# Patient Record
Sex: Female | Born: 1970 | Race: Black or African American | Hispanic: No | Marital: Married | Smoking: Never smoker
Health system: Southern US, Community
[De-identification: ages and names within clinical notes are randomized; demographics above are authoritative.]

## PROBLEM LIST (undated history)

## (undated) DIAGNOSIS — M069 Rheumatoid arthritis, unspecified: Secondary | ICD-10-CM

## (undated) DIAGNOSIS — Z1371 Encounter for nonprocreative screening for genetic disease carrier status: Secondary | ICD-10-CM

## (undated) DIAGNOSIS — Z9189 Other specified personal risk factors, not elsewhere classified: Secondary | ICD-10-CM

## (undated) DIAGNOSIS — E559 Vitamin D deficiency, unspecified: Secondary | ICD-10-CM

## (undated) DIAGNOSIS — T7840XA Allergy, unspecified, initial encounter: Secondary | ICD-10-CM

## (undated) DIAGNOSIS — A048 Other specified bacterial intestinal infections: Secondary | ICD-10-CM

## (undated) DIAGNOSIS — I499 Cardiac arrhythmia, unspecified: Secondary | ICD-10-CM

## (undated) DIAGNOSIS — Z803 Family history of malignant neoplasm of breast: Secondary | ICD-10-CM

## (undated) DIAGNOSIS — J302 Other seasonal allergic rhinitis: Secondary | ICD-10-CM

## (undated) DIAGNOSIS — E669 Obesity, unspecified: Secondary | ICD-10-CM

## (undated) DIAGNOSIS — Z8249 Family history of ischemic heart disease and other diseases of the circulatory system: Secondary | ICD-10-CM

## (undated) DIAGNOSIS — K219 Gastro-esophageal reflux disease without esophagitis: Secondary | ICD-10-CM

## (undated) DIAGNOSIS — D509 Iron deficiency anemia, unspecified: Secondary | ICD-10-CM

## (undated) HISTORY — DX: Rheumatoid arthritis, unspecified: M06.9

## (undated) HISTORY — DX: Vitamin D deficiency, unspecified: E55.9

## (undated) HISTORY — DX: Gastro-esophageal reflux disease without esophagitis: K21.9

## (undated) HISTORY — DX: Family history of ischemic heart disease and other diseases of the circulatory system: Z82.49

## (undated) HISTORY — PX: BREAST BIOPSY: SHX20

## (undated) HISTORY — DX: Obesity, unspecified: E66.9

## (undated) HISTORY — DX: Other seasonal allergic rhinitis: J30.2

## (undated) HISTORY — DX: Other specified bacterial intestinal infections: A04.8

## (undated) HISTORY — DX: Allergy, unspecified, initial encounter: T78.40XA

## (undated) HISTORY — DX: Iron deficiency anemia, unspecified: D50.9

## (undated) HISTORY — DX: Other specified personal risk factors, not elsewhere classified: Z91.89

## (undated) HISTORY — DX: Family history of malignant neoplasm of breast: Z80.3

## (undated) HISTORY — DX: Encounter for nonprocreative screening for genetic disease carrier status: Z13.71

## (undated) HISTORY — PX: WISDOM TOOTH EXTRACTION: SHX21

---

## 1998-06-12 ENCOUNTER — Emergency Department (HOSPITAL_COMMUNITY): Admission: EM | Admit: 1998-06-12 | Discharge: 1998-06-12 | Payer: Self-pay | Admitting: Emergency Medicine

## 2001-09-21 ENCOUNTER — Emergency Department (HOSPITAL_COMMUNITY): Admission: EM | Admit: 2001-09-21 | Discharge: 2001-09-21 | Payer: Self-pay

## 2001-09-30 ENCOUNTER — Emergency Department (HOSPITAL_COMMUNITY): Admission: EM | Admit: 2001-09-30 | Discharge: 2001-09-30 | Payer: Self-pay | Admitting: Emergency Medicine

## 2001-11-15 ENCOUNTER — Emergency Department (HOSPITAL_COMMUNITY): Admission: EM | Admit: 2001-11-15 | Discharge: 2001-11-15 | Payer: Self-pay | Admitting: Emergency Medicine

## 2002-11-11 ENCOUNTER — Emergency Department (HOSPITAL_COMMUNITY): Admission: EM | Admit: 2002-11-11 | Discharge: 2002-11-12 | Payer: Self-pay | Admitting: Emergency Medicine

## 2002-12-28 ENCOUNTER — Encounter: Payer: Self-pay | Admitting: Emergency Medicine

## 2002-12-28 ENCOUNTER — Emergency Department (HOSPITAL_COMMUNITY): Admission: EM | Admit: 2002-12-28 | Discharge: 2002-12-28 | Payer: Self-pay | Admitting: Emergency Medicine

## 2004-05-10 ENCOUNTER — Emergency Department (HOSPITAL_COMMUNITY): Admission: EM | Admit: 2004-05-10 | Discharge: 2004-05-10 | Payer: Self-pay | Admitting: Emergency Medicine

## 2005-12-21 ENCOUNTER — Emergency Department (HOSPITAL_COMMUNITY): Admission: EM | Admit: 2005-12-21 | Discharge: 2005-12-21 | Payer: Self-pay | Admitting: Emergency Medicine

## 2006-08-30 ENCOUNTER — Emergency Department (HOSPITAL_COMMUNITY): Admission: EM | Admit: 2006-08-30 | Discharge: 2006-08-31 | Payer: Self-pay | Admitting: Emergency Medicine

## 2008-12-06 ENCOUNTER — Ambulatory Visit: Payer: Self-pay | Admitting: Family Medicine

## 2008-12-06 ENCOUNTER — Other Ambulatory Visit: Admission: RE | Admit: 2008-12-06 | Discharge: 2008-12-06 | Payer: Self-pay | Admitting: Family Medicine

## 2009-05-23 ENCOUNTER — Ambulatory Visit: Payer: Self-pay | Admitting: Family Medicine

## 2009-07-11 ENCOUNTER — Ambulatory Visit: Payer: Self-pay | Admitting: Family Medicine

## 2009-07-24 ENCOUNTER — Ambulatory Visit: Payer: Self-pay | Admitting: Family Medicine

## 2009-08-22 ENCOUNTER — Ambulatory Visit: Payer: Self-pay | Admitting: Family Medicine

## 2009-08-22 ENCOUNTER — Encounter: Admission: RE | Admit: 2009-08-22 | Discharge: 2009-08-22 | Payer: Self-pay | Admitting: Family Medicine

## 2009-09-12 ENCOUNTER — Ambulatory Visit: Payer: Self-pay | Admitting: Physician Assistant

## 2009-09-13 ENCOUNTER — Ambulatory Visit: Payer: Self-pay | Admitting: Physician Assistant

## 2009-10-10 ENCOUNTER — Emergency Department (HOSPITAL_COMMUNITY): Admission: EM | Admit: 2009-10-10 | Discharge: 2009-10-10 | Payer: Self-pay | Admitting: Emergency Medicine

## 2009-10-24 ENCOUNTER — Ambulatory Visit: Payer: Self-pay | Admitting: Family Medicine

## 2010-10-07 LAB — URINALYSIS, ROUTINE W REFLEX MICROSCOPIC
Bilirubin Urine: NEGATIVE
Ketones, ur: NEGATIVE mg/dL
Nitrite: NEGATIVE
pH: 6 (ref 5.0–8.0)

## 2010-10-07 LAB — BASIC METABOLIC PANEL
BUN: 8 mg/dL (ref 6–23)
Creatinine, Ser: 0.88 mg/dL (ref 0.4–1.2)
GFR calc Af Amer: >60 mL/min (ref >60)
GFR calc non Af Amer: >60 mL/min (ref >60)
Glucose, Bld: 97 mg/dL (ref 70–99)
Sodium: 130 mEq/L — ABNORMAL LOW (ref 135–145)

## 2010-10-07 LAB — CBC
HCT: 37.1 % (ref 36.0–46.0)
MCV: 80.7 fL (ref 78.0–100.0)
Platelets: 281 10*3/uL (ref 150–400)
RDW: 16.9 % — ABNORMAL HIGH (ref 11.5–15.5)

## 2010-10-07 LAB — URINE CULTURE

## 2010-10-07 LAB — URINE MICROSCOPIC-ADD ON

## 2010-10-07 LAB — DIFFERENTIAL
Basophils Absolute: 0 10*3/uL (ref 0.0–0.1)
Eosinophils Absolute: 0 10*3/uL (ref 0.0–0.7)
Eosinophils Relative: 1 % (ref 0–5)
Neutrophils Relative %: 84 % — ABNORMAL HIGH (ref 43–77)

## 2010-10-14 DIAGNOSIS — A048 Other specified bacterial intestinal infections: Secondary | ICD-10-CM

## 2010-10-14 HISTORY — DX: Other specified bacterial intestinal infections: A04.8

## 2011-01-01 ENCOUNTER — Encounter: Payer: Self-pay | Admitting: Family Medicine

## 2011-01-01 ENCOUNTER — Ambulatory Visit (INDEPENDENT_AMBULATORY_CARE_PROVIDER_SITE_OTHER): Payer: No Typology Code available for payment source | Admitting: Family Medicine

## 2011-01-01 VITALS — BP 122/78 | HR 68 | Ht 65.0 in | Wt 215.0 lb

## 2011-01-01 DIAGNOSIS — J069 Acute upper respiratory infection, unspecified: Secondary | ICD-10-CM

## 2011-01-01 DIAGNOSIS — M542 Cervicalgia: Secondary | ICD-10-CM

## 2011-01-01 MED ORDER — NAPROXEN 500 MG PO TABS: 500.0000 mg | ORAL_TABLET | Freq: Two times a day (BID) | ORAL | Status: DC

## 2011-01-01 NOTE — Patient Instructions (Signed)
I recommend that you restart the sinus medication to help with the postnasal drip.  I also recommend getting Mucinex to help loosen up the phlegm.  If you get mucinex-D, then do NOT also take a sinus medication along with it.  I recommend you get  Mucinex DM (which has a cough suppressant), and take that along with the sinus medication and anti-inflammatory.

## 2011-01-01 NOTE — Progress Notes (Signed)
  Subjective:    Patient ID: Kristy [name], female    DOB: 1971/06/12, 40 y.o.   MRN: [id number]  HPI Began with a sore throat 2 weeks ago, then developed hoarseness and moved into the chest with some coughing.  Pain started in upper back and shoulders around the time that cold moved into chest.  Still has persistent hoarseness, chest congestion, and cough at night, worse when lying down.  Pain in back is also worse at night.  Pain is between her shoulder blades, and up into her neck, more on the left side than the right.  Has taken sinus medication (which helped), and a half of a muscle relaxant on day 3 of illness, but no medications since then.  Still needs to blow her nose 1 or 2x/day, slightly discolored.  Not coughing up any phlegm, feels like it is stuck in her chest   Review of Systems Denies fevers, nausea, vomiting, diarrhea, skin rash, ear pain, sore throat, other joint pains or other concerns    Objective:   Physical Exam BP 122/78  Pulse 68  Ht 5\' 5"  (1.651 m)  Wt 215 lb (97.523 kg)  BMI 35.78 kg/m2  LMP 12/19/2010 Well developed, pleasant female, in no distress.  Some throat clearing, slightly raspy voice HEENT:PERRL, EOMI, conjunctiva clear.  Nasal mucosa mildly edematous, clear mucus.  Sinuses nontender.  OP with some cobblestoning posteriorly.  TM's and EAC's normal on right, very mild effusion on left, otherwise normal Neck: no lymphadenopathy or thyromegaly.  No spinal tenderness.  Tender bilateral paraspinous muscles L>R with some spasm, also involving L trapezius muscle and rhomboids--minimal spasm noted Heart:  Regular rate and rhythm, no murmurs Lungs:  Clear bilaterally Skin: no rashes        Assessment & Plan:   1. URI (upper respiratory infection)    2. Neck pain  naproxen (NAPROSYN) 500 MG tablet   muscular in origin.  Discussed heat, massage and NSAIDS    NSAID precautions reviewed.  Heat, massage, stretches shown. Mucinex, decongestants prn  F/u  prn

## 2011-05-27 ENCOUNTER — Ambulatory Visit: Payer: Self-pay | Admitting: Obstetrics and Gynecology

## 2011-05-28 ENCOUNTER — Ambulatory Visit: Payer: Self-pay | Admitting: Obstetrics and Gynecology

## 2011-07-02 ENCOUNTER — Encounter: Payer: Self-pay | Admitting: Medical

## 2011-07-02 ENCOUNTER — Ambulatory Visit (INDEPENDENT_AMBULATORY_CARE_PROVIDER_SITE_OTHER): Payer: No Typology Code available for payment source | Admitting: Medical

## 2011-07-02 VITALS — BP 100/70 | HR 72 | Temp 98.4°F | Resp 12 | Wt 230.0 lb

## 2011-07-02 DIAGNOSIS — R519 Headache, unspecified: Secondary | ICD-10-CM | POA: Insufficient documentation

## 2011-07-02 DIAGNOSIS — J069 Acute upper respiratory infection, unspecified: Secondary | ICD-10-CM | POA: Insufficient documentation

## 2011-07-02 DIAGNOSIS — R51 Headache: Secondary | ICD-10-CM | POA: Insufficient documentation

## 2011-07-02 MED ORDER — HYDROCODONE-HOMATROPINE 5-1.5 MG/5ML PO SYRP: 5.0000 mL | ORAL_SOLUTION | Freq: Four times a day (QID) | ORAL | Status: AC | PRN

## 2011-07-02 NOTE — Patient Instructions (Signed)
Influenza, Adult Influenza ("the flu") is a viral infection of the respiratory tract. It causes chills, fever, cough, headache, body aches, and sore throat. Influenza in general will make you feel sicker than when you have a common cold. Symptoms of the illness typically last a few days. Cough and fatigue may continue for as long as 7 to 10 days. Influenza is highly contagious. It spreads easily to others in the droplets from coughs and sneezes. People frequently become infected by touching something that was recently contaminated with the virus and then touch their mouth, nose or eyes. This infection is caused by a virus. Symptoms will not be reduced or improved by taking an antibiotic. Antibiotics are medications that kill bacteria, not viruses. DIAGNOSIS  Diagnosis of influenza is often made based on the history and physical examination as well as the presence of influenza reports occurring in your community. Testing can be done if the diagnosis is not certain. TREATMENT  Since influenza is caused by a virus, antibiotics are not helpful. Your caregiver may prescribe antiviral medicines to shorten the illness and lessen the severity. Your caregiver may also recommend influenza vaccination and/or antiviral medicines for your family members in order to prevent the spread of influenza to them. HOME CARE INSTRUCTIONS  DO NOT GIVE ASPIRIN TO PERSONS WITH INFLUENZA WHO ARE UNDER 18 YEARS OF AGE. This could lead to brain and liver damage (Reye's syndrome). Read the label on over-the-counter medicines.   Stay home from work or school if at all possible until most of your symptoms are gone.   Only take over-the-counter or prescription medicines for pain, discomfort, or fever as directed by your caregiver.   Use a cool mist humidifier to increase air moisture. This will make breathing easier.   Rest until your temperature is nearly normal: 98.6 F (37 C). This usually takes 3 to 4 days. Be sure you get  plenty of rest.   Drink at least eight, eight-ounce glasses of fluids per day. Fluids include water, juice, broth, gelatin, or lemonade.   Cover your mouth and nose when coughing or sneezing and wash your hands often to prevent the spread of this virus to other persons.  PREVENTION  Annual influenza vaccination (flu shots) is the best way to avoid getting influenza. An annual flu shot is now routinely recommended for all adults in the [place] SEEK MEDICAL CARE IF:   You develop shortness of breath while resting.   You have a deep cough with production of mucous or chest pain.   You develop nausea (feeling sick to your stomach), vomiting, or diarrhea.  SEEK IMMEDIATE MEDICAL CARE IF:   You have difficulty breathing, become short of breath, or your skin or nails turn bluish.   You develop severe neck pain or stiffness.   You develop a severe headache, facial pain, or earache.   You have a fever.   You develop nausea or vomiting that cannot be controlled.  Document Released: 06/28/2000 Document Revised: 03/13/2011 Document Reviewed: 05/03/2009 [employer] Patient Information 2012 [employer]. 

## 2011-07-02 NOTE — Progress Notes (Signed)
Subjective:   HPI  Kristy [name] is a 40 y.o. female who presents with illness. C/o fever 103.4 last night, coughing, sore throat, congestion, runny nose, sneezing, bad headache, body aches, and chills x 2 days.  She has had some sick contacts.  Using Tylenol Cold and Flu, Tylenol sinus. No other aggravating or relieving factors.  No other c/o.  The following portions of the patient's history were reviewed and updated as appropriate: allergies, current medications, past family history, past medical history, past social history, past surgical history and problem list.  Past Medical History  Diagnosis Date  . H. pylori infection 10/2010  . Iron deficiency anemia, unspecified   . Unspecified vitamin D deficiency     Review of Systems Constitutional: +fever, +chills, -sweats, -unexpected -weight change,-fatigue ENT: +runny nose, -ear pain, +sore throat Cardiology:  -chest pain, -palpitations, -edema Respiratory: +cough, +shortness of breath, +wheezing Gastroenterology: -abdominal pain, -nausea, -vomiting, -diarrhea, -constipation Hematology: -bleeding or bruising problems Musculoskeletal: -arthralgias, -myalgias, -joint swelling, -back pain Ophthalmology: -vision changes Urology: -dysuria, -difficulty urinating, -hematuria, -urinary frequency, -urgency Neurology: +headache, -weakness, -tingling, -numbness    Objective:   Physical Exam  Filed Vitals:   07/02/11 1205  BP: 100/70  Pulse: 72  Temp: 98.4 F (36.9 C)  Resp: 12    General appearance: alert, no distress, WD/WN, mildly ill appearing HEENT: normocephalic, sclerae anicteric, conjunctiva pink and moist, TMs pearly, nares with swollen turbinates, clear discharge mild erythema, pharynx normal, tonsils unremarkable Oral cavity: MMM, no lesions Neck: supple, no lymphadenopathy, no thyromegaly, no masses Heart: RRR, normal S1, S2, no murmurs Lungs: CTA bilaterally, no wheezes, rhonchi, or rales Pulses: 2+  symmetric   Assessment and Plan :    Encounter Diagnoses  Name Primary?  . URI (upper respiratory infection) Yes  . Headache    Discussed supportive care, prevention, Ibuprofen for headaches, Hycodan syrup for cough prn.  Follow-up if worse or not improving.  Discussed usual course of illness.

## 2011-08-12 ENCOUNTER — Ambulatory Visit: Payer: Self-pay | Admitting: Obstetrics and Gynecology

## 2012-03-04 ENCOUNTER — Ambulatory Visit (INDEPENDENT_AMBULATORY_CARE_PROVIDER_SITE_OTHER): Payer: No Typology Code available for payment source | Admitting: Medical

## 2012-03-04 ENCOUNTER — Encounter: Payer: Self-pay | Admitting: Medical

## 2012-03-04 VITALS — BP 120/80 | HR 88 | Temp 98.1°F | Resp 16 | Wt 227.0 lb

## 2012-03-04 DIAGNOSIS — J329 Chronic sinusitis, unspecified: Secondary | ICD-10-CM

## 2012-03-04 DIAGNOSIS — R0600 Dyspnea, unspecified: Secondary | ICD-10-CM

## 2012-03-04 DIAGNOSIS — R0609 Other forms of dyspnea: Secondary | ICD-10-CM

## 2012-03-04 DIAGNOSIS — R0602 Shortness of breath: Secondary | ICD-10-CM

## 2012-03-04 DIAGNOSIS — R079 Chest pain, unspecified: Secondary | ICD-10-CM

## 2012-03-04 MED ORDER — AMOXICILLIN 500 MG PO CAPS: ORAL_CAPSULE | ORAL | Status: DC

## 2012-03-04 NOTE — Progress Notes (Signed)
Subjective: Here for c/o not feeling well.  She reports 3 wk hx/o sinus pressure, pain behind the eyes, chest congestion, chest pain, nausea, irritated throat which is some better, stuffy nose, congestion, SOB at times.  Concerned about sinus infection.  She also reports at times feels chest pains, sometimes SOB, worse with stairs or being physically active, more noticeable than in the past.  She doesn't exercise in general. She does note chest pain with lifting certain things or moving fast.  Denies associated nausea, sweats, numbness, tingling or weakness.  Past Medical History  Diagnosis Date  . H. pylori infection 10/2010  . Iron deficiency anemia, unspecified   . Unspecified vitamin D deficiency    Family hx/o: Father with onset of heart disease in late 9s, but he passed away of MI age 49yo Mom hx/o heart disease diagnosed early 40s  ROS as noted in HPI    Objective:   Physical Exam  Filed Vitals:   03/04/12 1426  BP: 120/80  Pulse: 88  Temp: 98.1 F (36.7 C)  Resp: 16    General appearance: alert, no distress, WD/WN, AA female HEENT: normocephalic, sclerae anicteric, +sinus tenderness,TMs pearly, nares with turbinate swelling and erythema, pharynx normal Oral cavity: MMM, no lesions Neck: supple, no lymphadenopathy, no thyromegaly, no masses Heart: RRR, normal S1, S2, no murmurs Lungs: CTA bilaterally, no wheezes, rhonchi, or rales Abdomen: +bs, soft, non tender, non distended, no masses, no hepatomegaly, no splenomegaly Pulses: 2+ symmetric Ext: no edema   Adult ECG Report  Indication: chest pain  Rate: 82bpm  Rhythm: normal sinus rhythm  QRS Axis: 65 degrees  PR Interval:  QRS Duration: 92ms  QTc:  Conduction Disturbances: none  Other Abnormalities: nonspecific T wave abnormality  Patient's cardiac risk factors are: family history of premature cardiovascular disease, obesity (BMI >= 30 kg/m2) and sedentary lifestyle.  EKG comparison: none  Narrative Interpretation: no acute changes, nonspecific T wave abnormality   CXR: normal cardiac silhouette, no pneumonia, no mass, no obvious deformity.  Of note, brass wires and fastner visible.  No acute findings.     Assessment and Plan :    Encounter Diagnoses  Name Primary?  . Sinusitis Yes  . Chest pain   . SOB (shortness of breath)   . DOE (dyspnea on exertion)     Sinusitis - begin Amoxicillin, rest, hydrate well, use OTC Mucinex, nasal saline.    Chest pain, SOB, DOE - discussed symptoms and concerns.  EKG and CXR not that remarkable.   I suspect her chest pain and symptoms are more related to musculoskeletal chest pain and SOB aggravated by sinusitis and chest congestion.  However, advised she return soon for physical, fasting labs, and if continued symptoms or worse, may consider cardiac eval.

## 2012-05-01 ENCOUNTER — Telehealth: Payer: Self-pay | Admitting: Medical

## 2012-05-01 NOTE — Telephone Encounter (Signed)
Pt notified of shane recommendations

## 2012-05-01 NOTE — Telephone Encounter (Signed)
i would recommend adding zyrtec allergy (not Zyrtec D), or mucinex plain OTC

## 2012-05-21 ENCOUNTER — Ambulatory Visit (INDEPENDENT_AMBULATORY_CARE_PROVIDER_SITE_OTHER): Payer: No Typology Code available for payment source | Admitting: Medical

## 2012-05-21 ENCOUNTER — Encounter: Payer: Self-pay | Admitting: Medical

## 2012-05-21 VITALS — BP 112/80 | HR 78 | Temp 98.2°F | Resp 16 | Wt 225.0 lb

## 2012-05-21 DIAGNOSIS — J310 Chronic rhinitis: Secondary | ICD-10-CM

## 2012-05-21 DIAGNOSIS — J329 Chronic sinusitis, unspecified: Secondary | ICD-10-CM

## 2012-05-21 MED ORDER — LEVOFLOXACIN 500 MG PO TABS: 500.0000 mg | ORAL_TABLET | Freq: Every day | ORAL | Status: DC

## 2012-05-21 MED ORDER — FLUTICASONE PROPIONATE 50 MCG/ACT NA SUSP: 2.0000 | Freq: Every day | NASAL | Status: DC

## 2012-05-21 NOTE — Patient Instructions (Signed)
Begin Levaquin antibiotic once daily for 1 week.  Begin fluticasone nasal spray, 2 sprays per nostril daily for the nasal congestion.  Drink plenty of water daily.   Try doing some nasal saline flush or neti pot.  Rest  If you are not completely resolved over this in 10-14 days, then call back.

## 2012-05-21 NOTE — Progress Notes (Signed)
Subjective: Here for recheck.  I saw her in August for sinus infection.  She notes that she never got better from treatment in August.  Currently has nasal congestion, yellow mucous, headache, has a mucous odor in her nose.   Denies fever, NVD, sore throat, ear pain.  Feels itchy in her ears, not much sneezing though.  No itchy eyes.   Feels very tired.  Has only had a few sinus infections in her life.  Has some chest congestion.  Using Tylenol Cold occasionally.  No other aggravating or relieving factors.  No sick contacts. Doesn't normally have allergy problems.  Gets sinus headaches every now and then.  Past Medical History  Diagnosis Date  . H. pylori infection 10/2010  . Iron deficiency anemia, unspecified   . Unspecified vitamin D deficiency    ROS as in HPI    Objective:   Physical Exam  Filed Vitals:   05/21/12 1004  BP: 112/80  Pulse: 78  Temp: 98.2 F (36.8 C)  Resp: 16    General appearance: alert, no distress, WD/WN  HEENT: normocephalic, sclerae anicteric, TMs pearly, nares with swollen turbinates, no discharge or erythema, pharynx with cobblestoning Oral cavity: MMM, no lesions Neck: supple, no lymphadenopathy, no thyromegaly, no masses Heart: RRR, normal S1, S2, no murmurs Lungs: CTA bilaterally, no wheezes, rhonchi, or rales  Assessment and Plan :    Encounter Diagnoses  Name Primary?  . Sinusitis Yes  . Rhinitis    Patient Instructions  Begin Levaquin antibiotic once daily for 1 week.  Begin fluticasone nasal spray, 2 sprays per nostril daily for the nasal congestion.  Drink plenty of water daily.   Try doing some nasal saline flush or neti pot.  Rest  If you are not completely resolved over this in 10-14 days, then call back.

## 2012-07-03 ENCOUNTER — Telehealth: Payer: Self-pay | Admitting: Medical

## 2012-07-03 NOTE — Telephone Encounter (Signed)
Pt was called and message was relayed

## 2012-07-03 NOTE — Telephone Encounter (Signed)
This doesn't necessarily sound urgent, but she does need OV to discuss.   I don't have any records related to gyn or the issues mentioned.

## 2012-07-03 NOTE — Telephone Encounter (Signed)
Pt called and stated that she was recently taken off birth control pills due to problems with clotting. Pt states this morning she has a nose bleed that ended with a huge clot coming out of her nose. She states after that her nose stopped bleeding. She also mentioned that her bp yesterday was 145/95 and this morning 135/80. We do not have any appointments left for today. Please call pt and inform if this is something to be concerned about. Pt uses [facility] [address]

## 2012-07-20 ENCOUNTER — Encounter (HOSPITAL_COMMUNITY): Payer: Self-pay | Admitting: Emergency Medicine

## 2012-07-20 ENCOUNTER — Emergency Department (HOSPITAL_COMMUNITY)
Admission: EM | Admit: 2012-07-20 | Discharge: 2012-07-20 | Disposition: A | Discharge status: Home / Self Care | Payer: No Typology Code available for payment source | Attending: Emergency Medicine | Admitting: Emergency Medicine

## 2012-07-20 DIAGNOSIS — Z8719 Personal history of other diseases of the digestive system: Secondary | ICD-10-CM | POA: Insufficient documentation

## 2012-07-20 DIAGNOSIS — R197 Diarrhea, unspecified: Secondary | ICD-10-CM | POA: Insufficient documentation

## 2012-07-20 DIAGNOSIS — R109 Unspecified abdominal pain: Secondary | ICD-10-CM | POA: Insufficient documentation

## 2012-07-20 DIAGNOSIS — Z3202 Encounter for pregnancy test, result negative: Secondary | ICD-10-CM | POA: Insufficient documentation

## 2012-07-20 DIAGNOSIS — R112 Nausea with vomiting, unspecified: Secondary | ICD-10-CM

## 2012-07-20 DIAGNOSIS — E559 Vitamin D deficiency, unspecified: Secondary | ICD-10-CM | POA: Insufficient documentation

## 2012-07-20 DIAGNOSIS — Z862 Personal history of diseases of the blood and blood-forming organs and certain disorders involving the immune mechanism: Secondary | ICD-10-CM | POA: Insufficient documentation

## 2012-07-20 DIAGNOSIS — Z79899 Other long term (current) drug therapy: Secondary | ICD-10-CM | POA: Insufficient documentation

## 2012-07-20 LAB — COMPREHENSIVE METABOLIC PANEL
ALT: 9 U/L (ref 0–35)
AST: 19 U/L (ref 0–37)
Alkaline Phosphatase: 69 U/L (ref 39–117)
CO2: 19 mEq/L (ref 19–32)
Calcium: 10.2 mg/dL (ref 8.4–10.5)
Chloride: 99 mEq/L (ref 96–112)
GFR calc non Af Amer: 78 mL/min — ABNORMAL LOW (ref >90)
Potassium: 4.5 mEq/L (ref 3.5–5.1)
Sodium: 133 mEq/L — ABNORMAL LOW (ref 135–145)
Total Bilirubin: 0.3 mg/dL (ref 0.3–1.2)

## 2012-07-20 LAB — CBC WITH DIFFERENTIAL/PLATELET
Basophils Absolute: 0 10*3/uL (ref 0.0–0.1)
Lymphocytes Relative: 9 % — ABNORMAL LOW (ref 12–46)
Neutro Abs: 7 10*3/uL (ref 1.7–7.7)
Neutrophils Relative %: 85 % — ABNORMAL HIGH (ref 43–77)
Platelets: 343 10*3/uL (ref 150–400)
RBC: 5.7 MIL/uL — ABNORMAL HIGH (ref 3.87–5.11)
RDW: 14.2 % (ref 11.5–15.5)
WBC: 8.2 10*3/uL (ref 4.0–10.5)

## 2012-07-20 LAB — URINALYSIS, MICROSCOPIC ONLY
Glucose, UA: NEGATIVE mg/dL
Protein, ur: >300 mg/dL — AB

## 2012-07-20 MED ORDER — SODIUM CHLORIDE 0.9 % IV BOLUS (SEPSIS)
1000.0000 mL | Freq: Once | INTRAVENOUS | Status: AC
  Administered 2012-07-20: 1000 mL via INTRAVENOUS

## 2012-07-20 MED ORDER — ONDANSETRON HCL 8 MG PO TABS: 8.0000 mg | ORAL_TABLET | Freq: Three times a day (TID) | ORAL | Status: DC | PRN

## 2012-07-20 MED ORDER — CETIRIZINE-PSEUDOEPHEDRINE ER 5-120 MG PO TB12: 1.0000 | ORAL_TABLET | Freq: Two times a day (BID) | ORAL | Status: DC | PRN

## 2012-07-20 MED ORDER — SODIUM CHLORIDE 0.9 % IV BOLUS (SEPSIS): 1000.0000 mL | Freq: Once | INTRAVENOUS | Status: DC

## 2012-07-20 MED ORDER — ONDANSETRON HCL 4 MG/2ML IJ SOLN: 4.0000 mg | Freq: Once | INTRAMUSCULAR | Status: DC

## 2012-07-20 MED ORDER — SODIUM CHLORIDE 0.9 % IV SOLN
1000.0000 mL | Freq: Once | INTRAVENOUS | Status: AC
  Administered 2012-07-20: 1000 mL via INTRAVENOUS

## 2012-07-20 MED ORDER — ONDANSETRON HCL 4 MG/2ML IJ SOLN
4.0000 mg | Freq: Once | INTRAMUSCULAR | Status: AC
  Administered 2012-07-20: 4 mg via INTRAVENOUS
  Filled 2012-07-20: qty 2

## 2012-07-20 NOTE — ED Provider Notes (Addendum)
History     CSN: [id number]  Arrival date & time 07/20/12  0827   First MD Initiated Contact with Patient 07/20/12 [phone]      Chief Complaint  Patient presents with  . Nausea  . Emesis  . Diarrhea    (Consider location/radiation/quality/duration/timing/severity/associated sxs/prior treatment) Patient is a 42 y.o. female presenting with vomiting and diarrhea. The history is provided by the patient.  Emesis  Associated symptoms include diarrhea. Pertinent negatives include no abdominal pain, no chills, no cough, no fever and no headaches.  Diarrhea The primary symptoms include vomiting and diarrhea. Primary symptoms do not include fever, abdominal pain, dysuria or rash.  The illness does not include chills or back pain.  pt with nvd today since early this am. Several episodes of each. Emesis clear, not bloody or bilious. Diarrhea watery, not bloody. Intermittent mid abd crampy pain, no constant or focal abd pain. No dysuria or gu c/o. No vaginal discharge or bleeding. lnmp 2 weeks ago. No known ill contacts, bad food ingestion, recent travel, or abx use. No fever or chills.     Past Medical History  Diagnosis Date  . H. pylori infection 10/2010  . Iron deficiency anemia, unspecified   . Unspecified vitamin D deficiency     Past Surgical History  Procedure Date  . Cesarean section     History reviewed. No pertinent family history.  History  Substance Use Topics  . Smoking status: Never Smoker   . Smokeless tobacco: Not on file  . Alcohol Use: Yes     Comment: maybe one drink a year    OB History    Grav Para Term Preterm Abortions TAB SAB Ect Mult Living                  Review of Systems  Constitutional: Negative for fever and chills.  HENT: Negative for sore throat and neck pain.   Eyes: Negative for redness.  Respiratory: Negative for cough and shortness of breath.   Cardiovascular: Negative for chest pain.  Gastrointestinal: Positive for vomiting and  diarrhea. Negative for abdominal pain.  Genitourinary: Negative for dysuria and flank pain.  Musculoskeletal: Negative for back pain.  Skin: Negative for rash.  Neurological: Negative for headaches.  Hematological: Does not bruise/bleed easily.  Psychiatric/Behavioral: Negative for confusion.    Allergies  Contrast media  Home Medications   Current Outpatient Rx  Name  Route  Sig  Dispense  Refill  . ADVIL ALLERGY & CONGESTION PO   Oral   Take 1 tablet by mouth daily as needed. For sinus headache.         Marland Kitchen VITAMIN D 1000 UNITS PO TABS   Oral   Take 1,000 Units by mouth daily.         Marland Kitchen GARCINIA CAMBOGIA-CHROMIUM PO   Oral   Take 2 tablets by mouth daily.         . ADULT MULTIVITAMIN W/MINERALS CH   Oral   Take 1 tablet by mouth daily.         Marland Kitchen VITAMIN C 500 MG PO TABS   Oral   Take 500 mg by mouth daily.           BP 111/79  Pulse 132  Temp 98.5 F (36.9 C) (Oral)  Resp 18  SpO2 98%  LMP 07/03/2012  Physical Exam  Nursing note and vitals reviewed. Constitutional: She appears well-developed and well-nourished. No distress.  HENT:  Mouth/Throat: Oropharynx is clear and  moist.  Eyes: Conjunctivae normal are normal. No scleral icterus.  Neck: Neck supple. No tracheal deviation present.  Cardiovascular: Normal rate, regular rhythm, normal heart sounds and intact distal pulses.   Pulmonary/Chest: Effort normal and breath sounds normal. No respiratory distress.  Abdominal: Soft. Normal appearance and bowel sounds are normal. She exhibits no distension and no mass. There is no tenderness. There is no rebound and no guarding.  Genitourinary:       No cva tenderness  Musculoskeletal: She exhibits no edema.  Neurological: She is alert.  Skin: Skin is warm and dry. No rash noted.  Psychiatric: She has a normal mood and affect.    ED Course  Procedures (including critical care time)   Labs Reviewed  CBC WITH DIFFERENTIAL  COMPREHENSIVE METABOLIC  PANEL  LIPASE, BLOOD  URINALYSIS, MICROSCOPIC ONLY   Results for orders placed during the hospital encounter of 07/20/12  CBC WITH DIFFERENTIAL      Component Value Range   WBC 8.2  4.0 - 10.5 K/uL   RBC 5.70 (*) 3.87 - 5.11 MIL/uL   Hemoglobin 15.5 (*) 12.0 - 15.0 g/dL   HCT 21.3  08.6 - 57.8 %   MCV 79.5  78.0 - 100.0 fL   MCH 27.2  26.0 - 34.0 pg   MCHC 34.2  30.0 - 36.0 g/dL   RDW 46.9  62.9 - 52.8 %   Platelets 343  150 - 400 K/uL   Neutrophils Relative 85 (*) 43 - 77 %   Neutro Abs 7.0  1.7 - 7.7 K/uL   Lymphocytes Relative 9 (*) 12 - 46 %   Lymphs Abs 0.8  0.7 - 4.0 K/uL   Monocytes Relative 5  3 - 12 %   Monocytes Absolute 0.4  0.1 - 1.0 K/uL   Eosinophils Relative 1  0 - 5 %   Eosinophils Absolute 0.1  0.0 - 0.7 K/uL   Basophils Relative 0  0 - 1 %   Basophils Absolute 0.0  0.0 - 0.1 K/uL  COMPREHENSIVE METABOLIC PANEL      Component Value Range   Sodium 133 (*) 135 - 145 mEq/L   Potassium 4.5  3.5 - 5.1 mEq/L   Chloride 99  96 - 112 mEq/L   CO2 19  19 - 32 mEq/L   Glucose, Bld 101 (*) 70 - 99 mg/dL   BUN 12  6 - 23 mg/dL   Creatinine, Ser 4.13  0.50 - 1.10 mg/dL   Calcium 24.4  8.4 - 01.0 mg/dL   Total Protein 9.4 (*) 6.0 - 8.3 g/dL   Albumin 4.2  3.5 - 5.2 g/dL   AST 19  0 - 37 U/L   ALT 9  0 - 35 U/L   Alkaline Phosphatase 69  39 - 117 U/L   Total Bilirubin 0.3  0.3 - 1.2 mg/dL   GFR calc non Af Amer 78 (*) >90 mL/min   GFR calc Af Amer >90  >90 mL/min  LIPASE, BLOOD      Component Value Range   Lipase 22  11 - 59 U/L  URINALYSIS, MICROSCOPIC ONLY      Component Value Range   Color, Urine AMBER (*) YELLOW   APPearance CLOUDY (*) CLEAR   Specific Gravity, Urine 1.031 (*) 1.005 - 1.030   pH 5.5  5.0 - 8.0   Glucose, UA NEGATIVE  NEGATIVE mg/dL   Hgb urine dipstick MODERATE (*) NEGATIVE   Bilirubin Urine NEGATIVE  NEGATIVE  Ketones, ur 15 (*) NEGATIVE mg/dL   Protein, ur >409 (*) NEGATIVE mg/dL   Urobilinogen, UA 0.2  0.0 - 1.0 mg/dL   Nitrite  NEGATIVE  NEGATIVE   Leukocytes, UA NEGATIVE  NEGATIVE   RBC / HPF 3-6  <3 RBC/hpf  POCT PREGNANCY, URINE      Component Value Range   Preg Test, Ur NEGATIVE  NEGATIVE        MDM  Iv ns bolus. zofran iv.  Reviewed nursing notes and prior charts for additional history.    2nd liter ns iv.  Recheck abd soft nt. Tolerating po. No recurrent nv.   Pt appears stable for d/c.     At d/c pt requests rx for nasal congestion/rhinorrhea. rx zyrtec d given.   Suzi Roots, MD 07/20/12 1032  Suzi Roots, MD 07/20/12 [phone]

## 2012-07-20 NOTE — ED Notes (Signed)
Pt requesting Rx for head and chest congestion prior to DC home, unable to locate Dr. Denton Lank, no answer on phone, will monitor.

## 2012-07-20 NOTE — ED Notes (Signed)
MD at bedside. 

## 2012-07-20 NOTE — ED Notes (Signed)
Pt states that she has been having NVD since 0400.  States she started a fast with her church on Thursday and has only been eating vegetables, fruits, and nuts.

## 2012-07-20 NOTE — ED Notes (Signed)
While talking to pt, HR jumped up and consistently stayed around 130.

## 2012-07-31 ENCOUNTER — Encounter: Payer: Self-pay | Admitting: Family Medicine

## 2012-07-31 ENCOUNTER — Ambulatory Visit (INDEPENDENT_AMBULATORY_CARE_PROVIDER_SITE_OTHER): Payer: No Typology Code available for payment source | Admitting: Family Medicine

## 2012-07-31 VITALS — BP 120/80 | HR 107 | Temp 98.7°F | Wt 217.0 lb

## 2012-07-31 DIAGNOSIS — J329 Chronic sinusitis, unspecified: Secondary | ICD-10-CM

## 2012-07-31 DIAGNOSIS — J029 Acute pharyngitis, unspecified: Secondary | ICD-10-CM

## 2012-07-31 NOTE — Progress Notes (Signed)
  Subjective:    Patient ID: Kristy [name], female    DOB: 1971-05-31, 42 y.o.   MRN: [id number]  HPI She is here for recheck. She recently had difficulty with nasal congestion starting in August. She was seen in November and treated with Levaquin as well as a nasal spray. That apparently did not help. She still having difficulty with nasal congestion, postnasal drainage, headache. No fever or chills. She has been placed on Amoxil, Augmentin and most recently Levaquin as well as given Flonase. The sinus congestion and postnasal drainage have continued.  Review of Systems     Objective:   Physical Exam alert and in no distress. Tympanic membranes and canals are normal. Throat shows diffuse swelling of the uvula. Tonsils are normal. Neck is supple without adenopathy or thyromegaly. Cardiac exam shows a regular sinus rhythm without murmurs or gallops. Lungs are clear to auscultation. Nasal mucosa is normal with some tenderness over frontal and maxillary sinuses       Assessment & Plan:   1. Sore throat  POCT rapid strep A  2. Chronic sinusitis     limited CT sinuses will be ordered.

## 2012-08-03 ENCOUNTER — Other Ambulatory Visit: Payer: Self-pay

## 2012-08-03 ENCOUNTER — Ambulatory Visit
Admission: RE | Admit: 2012-08-03 | Discharge: 2012-08-03 | Disposition: A | Discharge status: Home / Self Care | Payer: No Typology Code available for payment source | Source: Ambulatory Visit | Attending: Family Medicine | Admitting: Family Medicine

## 2012-08-03 DIAGNOSIS — J329 Chronic sinusitis, unspecified: Secondary | ICD-10-CM

## 2012-08-04 NOTE — Progress Notes (Signed)
Quick Note:  Pt informed of Dr.lalonde wants to send her to allergist she said ok [facility] [phone] jan 27th at 8:30 ______

## 2012-11-09 ENCOUNTER — Encounter: Payer: Self-pay | Admitting: Medical

## 2012-11-09 ENCOUNTER — Ambulatory Visit (INDEPENDENT_AMBULATORY_CARE_PROVIDER_SITE_OTHER): Payer: BC Managed Care – PPO | Admitting: Medical

## 2012-11-09 VITALS — BP 118/80 | HR 92 | Temp 97.9°F | Resp 16 | Ht 65.0 in | Wt 229.0 lb

## 2012-11-09 DIAGNOSIS — Z0000 Encounter for general adult medical examination without abnormal findings: Secondary | ICD-10-CM

## 2012-11-09 DIAGNOSIS — R002 Palpitations: Secondary | ICD-10-CM

## 2012-11-09 DIAGNOSIS — R202 Paresthesia of skin: Secondary | ICD-10-CM

## 2012-11-09 DIAGNOSIS — R209 Unspecified disturbances of skin sensation: Secondary | ICD-10-CM

## 2012-11-09 DIAGNOSIS — E669 Obesity, unspecified: Secondary | ICD-10-CM

## 2012-11-09 DIAGNOSIS — Z803 Family history of malignant neoplasm of breast: Secondary | ICD-10-CM

## 2012-11-09 DIAGNOSIS — R5383 Other fatigue: Secondary | ICD-10-CM

## 2012-11-09 DIAGNOSIS — R5381 Other malaise: Secondary | ICD-10-CM

## 2012-11-09 DIAGNOSIS — E559 Vitamin D deficiency, unspecified: Secondary | ICD-10-CM

## 2012-11-09 LAB — CBC WITH DIFFERENTIAL/PLATELET
Basophils Absolute: 0 10*3/uL (ref 0.0–0.1)
Basophils Relative: 0 % (ref 0–1)
HCT: 34.2 % — ABNORMAL LOW (ref 36.0–46.0)
Hemoglobin: 11.4 g/dL — ABNORMAL LOW (ref 12.0–15.0)
Lymphs Abs: 2.1 10*3/uL (ref 0.7–4.0)
MCH: 26.5 pg (ref 26.0–34.0)
MCHC: 33.3 g/dL (ref 30.0–36.0)
Monocytes Absolute: 0.3 10*3/uL (ref 0.1–1.0)
Neutro Abs: 2.3 10*3/uL (ref 1.7–7.7)
Neutrophils Relative %: 48 % (ref 43–77)
RDW: 15.5 % (ref 11.5–15.5)
WBC: 4.9 10*3/uL (ref 4.0–10.5)

## 2012-11-09 LAB — POCT URINALYSIS DIPSTICK
Ketones, UA: NEGATIVE
Protein, UA: NEGATIVE
Spec Grav, UA: 1.01
Urobilinogen, UA: NEGATIVE
pH, UA: 5

## 2012-11-09 NOTE — Progress Notes (Signed)
Subjective:   HPI  Kristy [name] is a 42 y.o. female who presents for a complete physical.  Last physical over 1 year ago.     Preventative care: Last ophthalmology visit:n/a Last dental visit:yes-unsure of the name Last colonoscopy:n/a Last mammogram:08/2011, has been getting since age 18yo, normal last year, been getting yearly, no prior  Gene testing Last gynecological exam:08/2011 , last pap 09/2011 normal Last EKG:02/2012 Last labs:09/2011  Prior vaccinations: TD or Tdap:about 5 years ago Influenza:n/a Pneumococcal: n/a Shingles/Zostavax:n/a  Advanced directive:n/a Health care power of attorney:n/a Living will:n/a  Concerns: Hands and feet feel numb at times.  Wonders if this is related to her work.   Worse when first wake up.  Once she gets up and gets moving, doesn't have problems.  Sometimes have pain in hands.   Has been diagnosed with plantar fascitis in feet.    Not currently sexually active.   No OCPs currently.  Uses condoms, uses gels.   Last time she was on contraception, had blood clot issue in nose, so OCP was discontinued.    Saw me for chest pain few months ago.   Still gets palpitations 2-3 times daily regardless of caffeine use.    Reviewed their medical, surgical, family, social, medication, and allergy history and updated chart as appropriate.   Past Medical History  Diagnosis Date  . H. pylori infection 10/2010  . Iron deficiency anemia, unspecified   . Unspecified vitamin D deficiency   . Eczema     Past Surgical History  Procedure Laterality Date  . Cesarean section    . Wisdom tooth extraction      Family History  Problem Relation Age of Onset  . Heart disease Mother 56    stent, CAD  . Cancer Mother 51    breast, with recurrence  . Cancer Father     esophageal cancer  . Heart disease Father 9    died of MI  . Alcohol abuse Father   . Hypertension Sister   . Hypertension Brother   . Cancer Maternal Grandmother     breast  .  Diabetes Neg Hx   . Stroke Neg Hx   . Hypertension Brother     History   Social History  . Marital Status: Married    Spouse Name: N/A    Number of Children: N/A  . Years of Education: N/A   Occupational History  . Not on file.   Social History Main Topics  . Smoking status: Never Smoker   . Smokeless tobacco: Not on file  . Alcohol Use: No     Comment: maybe one drink a year  . Drug Use: No  . Sexually Active: Not on file   Other Topics Concern  . Not on file   Social History Narrative   Separated, in process of divorce, some walking, works as a Social worker    No current outpatient prescriptions on file prior to visit.   No current facility-administered medications on file prior to visit.    Allergies  Allergen Reactions  . Contrast Media (Iodinated Diagnostic Agents) Itching      Review of Systems Constitutional: -fever, -chills, -sweats, -unexpected weight change, -decreased appetite, +fatigue Allergy: -sneezing, -itching, -congestion Dermatology: -changing moles, --rash, -lumps ENT: -runny nose, -ear pain, -sore throat, -hoarseness, +sinus pain, -teeth pain, - ringing in ears, -hearing loss, -nosebleeds Cardiology: -chest pain, -palpitations, -swelling, -difficulty breathing when lying flat, -waking up short of breath Respiratory: -cough, -shortness of breath, -difficulty  breathing with exercise or exertion, -wheezing, -coughing up blood Gastroenterology: -abdominal pain, -nausea, -vomiting, -diarrhea, -constipation, -blood in stool, -changes in bowel movement, -difficulty swallowing or eating Hematology: -bleeding, -bruising  Musculoskeletal: +joint aches, -muscle aches, -joint swelling, -back pain, -neck pain, -cramping, -changes in gait Ophthalmology: denies vision changes, eye redness, itching, discharge Urology: -burning with urination, -difficulty urinating, -blood in urine, -urinary frequency, -urgency, -incontinence Neurology: -headache, -weakness,  +tingling, +numbness, -memory loss, -falls, -dizziness Psychology: -depressed mood, -agitation, -sleep problems     Objective:   Physical Exam  Vitals reviewed  General appearance: alert, no distress, WD/WN, AA female Skin: few small skin tags left neck HEENT: normocephalic, conjunctiva/corneas normal, sclerae anicteric, PERRLA, EOMi, nares patent, no discharge or erythema, pharynx normal Oral cavity: MMM, tongue normal, teeth in good repair Neck: supple, no lymphadenopathy, no thyromegaly, no masses, normal ROM, no bruits Chest: non tender, normal shape and expansion Heart: RRR, normal S1, S2, no murmurs Lungs: CTA bilaterally, no wheezes, rhonchi, or rales Abdomen: +bs, soft, non tender, non distended, no masses, no hepatomegaly, no splenomegaly, no bruits Back: non tender, normal ROM, no scoliosis Musculoskeletal: upper extremities non tender, no obvious deformity, normal ROM throughout, lower extremities non tender, no obvious deformity, normal ROM throughout Extremities: no edema, no cyanosis, no clubbing Pulses: 2+ symmetric, upper and lower extremities, normal cap refill Neurological: alert, oriented x 3, -phalens and tinels, CN2-12 intact, strength normal upper extremities and lower extremities, sensation normal throughout, DTRs 2+ throughout, no cerebellar signs, gait normal Psychiatric: normal affect, behavior normal, pleasant  Breast/gyn/rectal:  deferred    Assessment and Plan :    Encounter Diagnoses  Name Primary?  . Routine general medical examination at a health care facility Yes  . Obesity, unspecified   . Unspecified vitamin D deficiency   . Other malaise and fatigue   . Palpitations   . Family history of breast cancer   . Paresthesia     Physical exam - discussed healthy lifestyle, diet, exercise, preventative care, vaccinations, and addressed their concerns.  Handout given.  Obesity - discussed need to work on diet and exercise changes.    Vit D  deficiecy  - recheck labs, and advised she restart OTC Vit D 1000 IU daily  fatigue - labs today, need to increase exercise, work on weight loss, healthy diet.  Palpitations - refer to cardiology regarding family hx/o heart disease, palpitations, avoidance of caffeine  Family hx/o breast cancer - consider genetic testing, advised she consult her insurer, f/u with gynecology next week as planned.  Paresthesia - consider QHS OTC wrist splints, regular stretching.    Follow-up pending labs

## 2012-11-09 NOTE — Patient Instructions (Signed)
Check on the breast cancer gene test.   Ask your gynecologist again about frequency of mammograms.  Do you need them every year, vs every other year?  Get back on Vitamin D OTC daily  1000 IU daily.  We will call with lab results.   Start exercising with walking, preferably daily.  Eat a healthy low fat diet.  We will refer you to cardiology for palpitations and family history of heart disease.  If they clear you for weight loss medication (Phentermine/Qsymia), then we can try this.

## 2012-11-10 ENCOUNTER — Telehealth: Payer: Self-pay | Admitting: Family Medicine

## 2012-11-10 LAB — COMPREHENSIVE METABOLIC PANEL
ALT: 8 U/L (ref 0–35)
AST: 13 U/L (ref 0–37)
Albumin: 3.5 g/dL (ref 3.5–5.2)
Alkaline Phosphatase: 44 U/L (ref 39–117)
BUN: 6 mg/dL (ref 6–23)
CO2: 27 mEq/L (ref 19–32)
Calcium: 8.9 mg/dL (ref 8.4–10.5)
Creat: 0.84 mg/dL (ref 0.50–1.10)
Glucose, Bld: 81 mg/dL (ref 70–99)
Sodium: 138 mEq/L (ref 135–145)
Total Bilirubin: 0.3 mg/dL (ref 0.3–1.2)
Total Protein: 6.7 g/dL (ref 6.0–8.3)

## 2012-11-10 LAB — VITAMIN D 25 HYDROXY (VIT D DEFICIENCY, FRACTURES): Vit D, 25-Hydroxy: 12 ng/mL — ABNORMAL LOW (ref 30–89)

## 2012-11-10 LAB — LIPID PANEL
Cholesterol: 187 mg/dL (ref 0–200)
LDL Cholesterol: 130 mg/dL — ABNORMAL HIGH (ref 0–99)
Triglycerides: 59 mg/dL (ref <150)

## 2012-11-10 NOTE — Telephone Encounter (Signed)
Patient is aware of her appointment to see Dr. Donnie Aho on Nov 16, 2012 @ 145 pm. CLS   [phone] I fax over OV notes, labs and insurance card through [facility]. CLS

## 2012-11-12 DIAGNOSIS — Z1371 Encounter for nonprocreative screening for genetic disease carrier status: Secondary | ICD-10-CM

## 2012-11-12 DIAGNOSIS — Z9189 Other specified personal risk factors, not elsewhere classified: Secondary | ICD-10-CM

## 2012-11-12 HISTORY — DX: Encounter for nonprocreative screening for genetic disease carrier status: Z13.71

## 2012-11-12 HISTORY — DX: Other specified personal risk factors, not elsewhere classified: Z91.89

## 2012-11-16 ENCOUNTER — Encounter: Payer: Self-pay | Admitting: Cardiology

## 2012-11-16 NOTE — Progress Notes (Signed)
Patient ID: Artemio Aly, female   DOB: 09-06-1970, 42 y.o.   MRN: [id number] Laraine, Samet    Date of visit:  11/16/2012 DOB:  12-May-1971    Age:  42 yrs. Medical record number:  [id number]     Account number:  [id number] Primary Care Provider: Ernst Breach ____________________________ CURRENT DIAGNOSES  1. Palpitations  2. Obesity(BMI30-40) ____________________________ ALLERGIES  IV Contrast, Itching ____________________________ MEDICATIONS  1. Vitamin D3 2,000 unit tablet, 1 p.o. daily  2. Vitamin C 500 mg tablet extended release, 1 p.o. daily  3. iron 18 mg tablet, 1 p.o. daily ____________________________ CHIEF COMPLAINTS  Palp ____________________________ HISTORY OF PRESENT ILLNESS  Patient seen at the request of Kristian Covey for evaluation of palpitations. The patient recently presented for a physical and was asking about whether she can take anorexic suppressants. She has had episodic palpitations described as a skip or fluttering that will occur at various times. She has had been on off for the past 2-3 months and notes that they occur usually associated after drinking caffeine or even soft drinks that did not contain caffeine. She does not have any chest pain suggestive of angina. She has no PND, orthopnea, syncope, or claudication does not get much in the way of regular exercise. She does not use over-the-counter decongestants. ____________________________ PAST HISTORY  Past Medical Illnesses:  history of H. Pylori infection, obesity, denies hypertension or diabetes;  Cardiovascular Illnesses:  no previous history of cardiac disease.;  Surgical Procedures:  cesarean section;  Cardiology Procedures-Invasive:  no history of prior cardiac procedures;  Cardiology Procedures-Noninvasive:  event monitor May 2014;  LVEF not documented,   ____________________________ CARDIO-PULMONARY TEST DATES EKG Date:  11/16/2012;  Holter/Event Monitor Date: 11/16/2012;    ____________________________ FAMILY HISTORY Father - age 31,  died of myocardial infarction and cancer esophagus; Mother - age 49,  alive and well,  history of CAD,  history of PTCA/stent and breast cancer; Brother 1 - age 25,  alive and well and  hypertension; Brother 2 - age 1,  alive and well and  hypertension; Sister 1 - age 51,  alive and well and  hypertension; Sister 2 - age 60,  alive and well and  hypertension;  ____________________________ SOCIAL HISTORY Alcohol Use:  no alcohol use;  Smoking:  never smoked;  Diet:  regular diet;  Lifestyle:  separated and 1 daughter;  Exercise:  no regular exercise;  Occupation:  Social worker;  Residence:  lives with daughter;   ____________________________ REVIEW OF SYSTEMS General:  obesity  Integumentary:dry skin Eyes: denies diplopia, history of glaucoma or visual problems. Ears, Nose, Throat, Mouth:  denies any hearing loss, epistaxis, hoarseness or difficulty speaking. Respiratory: denies dyspnea, cough, wheezing or hemoptysis. Cardiovascular:  please review HPI Abdominal: constipation, diarrhea and dyspepsiaGenitourinary-Female: no dysuria, urgency, frequency, UTIs, or stress incontinence Musculoskeletal:  denies arthritis, venous insufficiency, or muscle weakness. Neurological:  denies headaches, stroke, or TIA Psychiatric:  denies depession or anxiety.  ____________________________ PHYSICAL EXAMINATION VITAL SIGNS  Blood Pressure:  118/80 Sitting, Left arm, large cuff  , 110/70 Standing, Left arm and large cuff   Pulse:  78/min. Weight:  231.00 lbs. Height:  65"BMI: 38  Constitutional:  pleasant African American female in no acute distress, severely obese Skin:  warm and dry to touch, no apparent skin lesions, or masses noted. Head:  normocephalic, normal hair pattern, no masses or tenderness Eyes:  EOMS Intact, PERRLA, C and S clear, Funduscopic exam not done. ENT:  ears, nose and throat reveal  no gross abnormalities.  Dentition  good. Neck:  supple, without massess. No JVD, thyromegaly or carotid bruits. Carotid upstroke normal. Chest:  normal symmetry, clear to auscultation and percussion. Cardiac:  regular rhythm, normal S1 and S2, No S3 or S4, no murmurs, gallops or rubs detected. Abdomen:  abdomen soft,non-tender, no masses, no hepatospenomegaly, or aneurysm noted Peripheral Pulses:  the femoral,dorsalis pedis, and posterior tibial pulses are full and equal bilaterally with no bruits auscultated. Extremities & Back:  no deformities, clubbing, cyanosis, erythema or edema observed. Normal muscle strength and tone. Neurological:  no gross motor or sensory deficits noted, affect appropriate, oriented x3. ____________________________ IMPRESSIONS/PLAN  1. Palpitations with a normal EKG 2. Severe obesity with need to lose weight  Recommendations:  Place an event monitor to determine the etiology of the palpitations. She asked about whether she can take anorexic agents and I told her that she would need to await the results of the event monitor. Followup in one month. ____________________________ TODAYS ORDERS  1. [place]: Today  2. Return Visit: 1 month  3. 12 Lead EKG: Today                       ____________________________ Cardiology Physician:  Darden Palmer MD [facility]

## 2012-12-21 ENCOUNTER — Encounter: Payer: Self-pay | Admitting: Cardiology

## 2012-12-21 NOTE — Progress Notes (Unsigned)
Patient ID: Kristy [name], female   DOB: [birth year], 42 y.o.   MRN: [id number]  Keerat, Denicola    Date of visit:  12/21/2012 DOB:  03-10-1971    Age:  42 yrs. Medical record number:  [id number]     Account number:  [id number] Primary Care Provider: Ernst Breach ____________________________ CURRENT DIAGNOSES  1. Arrhythmia-PVC's(symptomatic)  2. Dyspnea  3. Obesity(BMI30-40) ____________________________ ALLERGIES  IV Contrast, Itching ____________________________ MEDICATIONS  1. Vitamin D3 2,000 unit tablet, 1 p.o. daily  2. Vitamin C 500 mg tablet extended release, 1 p.o. daily  3. iron 18 mg tablet, 1 p.o. daily ____________________________ HISTORY OF PRESENT ILLNESS  Patient seen for cardiac followup. She has worn a cardiac event monitor for the past month the patient on occasional PVCs and bigeminy. She feels as if the palpitations have markedly improved. She does not any syncope or dizziness and denies angina or shortness of breath. ____________________________ PAST HISTORY  Past Medical Illnesses:  history of H. Pylori infection, obesity, denies hypertension or diabetes;  Cardiovascular Illnesses:  no previous history of cardiac disease.;  Surgical Procedures:  cesarean section;  Cardiology Procedures-Invasive:  no history of prior cardiac procedures;  Cardiology Procedures-Noninvasive:  event monitor May 2014;  LVEF not documented,   ____________________________ CARDIO-PULMONARY TEST DATES EKG Date:  11/16/2012;  Holter/Event Monitor Date: 11/16/2012;   ____________________________ SOCIAL HISTORY Alcohol Use:  no alcohol use;  Smoking:  never smoked;  Diet:  regular diet;  Lifestyle:  separated and 1 daughter;  Exercise:  no regular exercise;  Occupation:  Social worker;  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 Weight:  231.00 lbs. Height:  65"BMI: 38  Constitutional:  pleasant African American female in no acute distress, severely obese Skin:  warm and dry to touch, no apparent skin lesions, or masses noted. Chest:  normal symmetry, clear to auscultation and percussion. Cardiac:  regular rhythm, normal S1 and S2, No S3 or S4, no murmurs, gallops or rubs detected. Peripheral Pulses:  the femoral,dorsalis pedis, and posterior tibial pulses are full and equal bilaterally with no bruits auscultated. Neurological:  no gross motor or sensory deficits noted, affect appropriate, oriented x3. ____________________________ MOST RECENT LIPID PANEL 11/09/12  CHOL TOTL 187 mg/dl, LDL 952 calc, HDL 45 mg/dl, TRIGLYCER 59 mg/dl and CHOL/HDL 4.2 (Calc) ____________________________ IMPRESSIONS/PLAN  1. Palpitations are likely due to PVCs 2. Severe obesity 3. Mild dyspnea with exertion  Recommendations:  She does have mild dyspnea with exertion and suggested that she have an echocardiogram since she has PVCs and there is a possibility that anorectic agents are being contemplated. Otherwise we gave patient education about PVCs which I think in her case are likely benign and need no further treatment. She was instructed to avoid agents that would act as cardiac stimulants such as over-the-counter decongestants or caffeine. I will see as needed. ____________________________ TODAYS ORDERS  1. Return: prn  2. 2D, color flow, doppler: First Available                       ____________________________ Cardiology Physician:  Darden Palmer. MD [facility]

## 2013-01-14 ENCOUNTER — Telehealth: Payer: Self-pay | Admitting: Medical

## 2013-01-14 NOTE — Telephone Encounter (Signed)
Pt called and stated that you referred her to cardio to make sure she was ok before placing her on diet medication. Pt states that she just heard and they gave her the all clear. She is requesting that medication you talked about with her be called into her pharmacy, [facility] [address] Pt. Was informed that you are out of the office until Monday.

## 2013-01-18 NOTE — Telephone Encounter (Signed)
I called over to Dr. York Spaniel office and he is out of the office until Thursday but she will call me back and let me know something. CLS

## 2013-01-18 NOTE — Telephone Encounter (Signed)
pls verify with Dr. York Spaniel office that he is ok for her to start Phentermine/appetite suppressant.   I got notes from first visit and cardiac testing, but didn't see a f/u note or whether he was ok for her to start this.

## 2013-01-21 ENCOUNTER — Encounter: Payer: Self-pay | Admitting: Cardiology

## 2013-01-21 ENCOUNTER — Other Ambulatory Visit: Payer: Self-pay | Admitting: Medical

## 2013-01-21 ENCOUNTER — Other Ambulatory Visit: Payer: Self-pay

## 2013-01-21 MED ORDER — PHENTERMINE HCL 37.5 MG PO CAPS: 37.5000 mg | ORAL_CAPSULE | ORAL | Status: DC

## 2013-01-21 NOTE — Progress Notes (Signed)
WHAT IS THE DOSAGE

## 2013-01-21 NOTE — Progress Notes (Unsigned)
Patient ID: Kristy [name], female   DOB: 1971/05/13, 42 y.o.   MRN: [id number]  Patient has a normal ECHO  Done on 7/2 with EF 55%.  Normal right ventricle.  Was seen 5/5 in consult.  An event monitor worn 5/5 - 12/15/12 showed bigeminal PVC's but no other arrhythmias.  From a a cardiac viewpoint may use anorectic agents to help facilitate weight loss.  Would suggest repeating an ECHO in one year if she takes them long term.  Darden Palmer MD [facility]

## 2013-01-21 NOTE — Telephone Encounter (Signed)
CALLED PHRTERMINE IN PER SHANE

## 2013-01-21 NOTE — Progress Notes (Signed)
NEVER MIND DONE

## 2013-01-21 NOTE — Progress Notes (Signed)
MED CALLED IN AND PT INFORMED WORD FOR WORD SHE VERBALIZED UNDERSTANDING

## 2013-01-22 ENCOUNTER — Encounter: Payer: Self-pay | Admitting: Medical

## 2013-02-09 ENCOUNTER — Ambulatory Visit (INDEPENDENT_AMBULATORY_CARE_PROVIDER_SITE_OTHER): Payer: BC Managed Care – PPO | Admitting: Medical

## 2013-02-09 ENCOUNTER — Encounter: Payer: Self-pay | Admitting: Medical

## 2013-02-09 VITALS — BP 116/74 | HR 64 | Temp 98.1°F | Resp 16 | Wt 219.0 lb

## 2013-02-09 DIAGNOSIS — L301 Dyshidrosis [pompholyx]: Secondary | ICD-10-CM

## 2013-02-09 NOTE — Progress Notes (Signed)
Subjective: Here for rash along right middle finger, started a month ago.  Initially was bumps like pustules along middle finger.  These dried up then left scab dots.  Whole area seemed to have had pus under the skin.  Once it dried up, the finger looked better, but just doesn't seem to be healing.  Has some itching, cracking open.  No prior similar.  Has tried Triamcinolone cream prescription x 1wk for itching, has used some Neosporin, not much improvement.    Objective: Gen: wd, wn, nad Skin: right middle finger dorsal middle phalanx and over PIP dorsallly with rough brown patch of skin, some cracking Cap refill and pulses normal of hands  Assessment: Encounter Diagnosis  Name Primary?  . Dyshidrotic dermatitis Yes   Plan:  Discussed diagnosis, causes, potential treatment options.  Advised she begin either Eucerin cream or Vaseline OTC BID x 1-2 wk, then keep the skin moisturized daily with lotion.   If not improving, call or return.

## 2013-02-09 NOTE — Patient Instructions (Signed)
Handout given

## 2013-03-16 ENCOUNTER — Ambulatory Visit: Payer: Self-pay

## 2013-03-31 ENCOUNTER — Encounter: Payer: Self-pay | Admitting: Family Medicine

## 2013-03-31 ENCOUNTER — Ambulatory Visit (INDEPENDENT_AMBULATORY_CARE_PROVIDER_SITE_OTHER): Payer: BC Managed Care – PPO | Admitting: Family Medicine

## 2013-03-31 VITALS — BP 112/74 | HR 64 | Temp 98.3°F | Ht 65.0 in | Wt 207.0 lb

## 2013-03-31 DIAGNOSIS — J309 Allergic rhinitis, unspecified: Secondary | ICD-10-CM

## 2013-03-31 DIAGNOSIS — J069 Acute upper respiratory infection, unspecified: Secondary | ICD-10-CM

## 2013-03-31 NOTE — Progress Notes (Signed)
Chief Complaint  Patient presents with  . Nasal Congestion    since Saturday, blowing out lots of yellow mucus. Has also had headche. No coughing. No fever.    4 days ago she started having a sore throat, then developed some achiness, feeling cold, and blowing a lot of yellow mucus.  Denies any cough, shortness of breath, fevers.  She had some chills over the weekend, resolved.  Denies any sick contacts in the home, possibly at work.  She has a history of allergies, saw the allergist and was treated with nasal washes and zyrtec.  Her congestion got better.  She is concerned that she is having recurrent sinus congestion.  Review of note from allergist from visit 08/2012 showed +allergies to ragweed, tree pollen, dust and mold.  She was treated with zyrtec, and had also been on flonase sprays at the time.  Past Medical History  Diagnosis Date  . H. pylori infection 10/2010  . Iron deficiency anemia, unspecified   . Unspecified vitamin D deficiency   . Seasonal allergies     ragweed, tree pollen, mild and dust per testing 08/2012   Past Surgical History  Procedure Laterality Date  . Cesarean section    . Wisdom tooth extraction     History   Social History  . Marital Status: Married    Spouse Name: N/A    Number of Children: N/A  . Years of Education: N/A   Occupational History  . Not on file.   Social History Main Topics  . Smoking status: Never Smoker   . Smokeless tobacco: Not on file  . Alcohol Use: No     Comment: maybe one drink a year  . Drug Use: No  . Sexual Activity: Not Currently   Other Topics Concern  . Not on file   Social History Narrative   Separated, in process of divorce, some walking, works as a Social worker    Current outpatient prescriptions:cholecalciferol (VITAMIN D) 1000 UNITS tablet, Take 1,000 Units by mouth daily., Disp: , Rfl:   Allergies  Allergen Reactions  . Contrast Media [Iodinated Diagnostic Agents] Itching   ROS:  Denies fevers,  nausea, vomiting, diarrhea, skin rash. +myalgias, +chills (resolved) , sore throat  BP 112/74  Pulse 64  Temp(Src) 98.3 F (36.8 C) (Oral)  Ht 5\' 5"  (1.651 m)  Wt 207 lb (93.895 kg)  BMI 34.45 kg/m2  LMP 03/26/2013 Well developed, pleasant female in no distress. No sniffling or coughing  HEENT:  PERRL, EOMI, conjunctiva clear. TM's and EAC's normal.  OP with cobblestoning posteriorly.  Nasal mucosa moderately-severely edematous on right, with whitish drainage.  Left nasal mucosa is mild-moderately edematous with yellow drainage.  Sinuses are nontender Neck: no lymphadenopathy, thyromegaly or mass Heart: regular rate and rhythm without murmur Lungs: clear bilaterally Skin: no rash Psych: normal mood, affect, hygiene and grooming  ASSESSMENT/PLAN:  URI (upper respiratory infection)  Allergic rhinitis, cause unspecified   Drink plenty of fluids. You likely have some underlying allergies (that started earlier with clear mucus), as well as a virus.  Cannot rule out early sinus infection. Start back using the zyrtec, as well as flonase (okay to wait a few days) Use sinus rinses (or Neti-pot) once or twice daily to help loosen the secretions and clear the sinuses. You may also use guaifenesin (mucinex) to help loosen the secretions. You may use a decongestant (sudafed, or a D version of zyrtec) if needed for sinus pain  Call Monday if your  symptoms are not improving (persist/worsen--ongoing sinus pain, discolored mucus, fevers, etc) for an antibiotic

## 2013-03-31 NOTE — Patient Instructions (Signed)
Drink plenty of fluids. You likely have some underlying allergies (that started earlier with clear mucus), as well as a virus.  Cannot rule out early sinus infection. Start back using the zyrtec, as well as flonase (okay to wait a few days) Use sinus rinses (or Neti-pot) once or twice daily to help loosen the secretions and clear the sinuses. You may also use guaifenesin (mucinex) to help loosen the secretions. You may use a decongestant (sudafed, or a D version of zyrtec) if needed for sinus pain  Call Monday if your symptoms are not improving (persist/worsen--ongoing sinus pain, discolored mucus, fevers, etc) for an antibiotic

## 2013-04-01 ENCOUNTER — Ambulatory Visit (INDEPENDENT_AMBULATORY_CARE_PROVIDER_SITE_OTHER): Payer: BC Managed Care – PPO | Admitting: Family Medicine

## 2013-04-01 VITALS — Wt 210.0 lb

## 2013-04-01 DIAGNOSIS — S00451A Superficial foreign body of right ear, initial encounter: Secondary | ICD-10-CM

## 2013-04-01 DIAGNOSIS — T169XXA Foreign body in ear, unspecified ear, initial encounter: Secondary | ICD-10-CM

## 2013-04-01 NOTE — Progress Notes (Signed)
  Subjective:    Patient ID: Kristy [name], female    DOB: [birth year], 42 y.o.   MRN: [id number]  HPI She is here for removal of cotton from her right ear. She was using a Q-tip to clean her ears and the cotton came off.   Review of Systems     Objective:   Physical Exam Cotton easily removed with alligator forceps. The TM and canal is normal.       Assessment & Plan:  Foreign body in ear lobe, right, initial encounter  cautioned her on the use of Q-tips in her ears.

## 2013-10-27 ENCOUNTER — Ambulatory Visit (INDEPENDENT_AMBULATORY_CARE_PROVIDER_SITE_OTHER): Payer: BC Managed Care – PPO | Admitting: Medical

## 2013-10-27 ENCOUNTER — Encounter: Payer: Self-pay | Admitting: Medical

## 2013-10-27 VITALS — BP 120/80 | HR 62 | Temp 98.4°F | Resp 14 | Wt 210.0 lb

## 2013-10-27 DIAGNOSIS — N898 Other specified noninflammatory disorders of vagina: Secondary | ICD-10-CM

## 2013-10-27 DIAGNOSIS — Z975 Presence of (intrauterine) contraceptive device: Secondary | ICD-10-CM

## 2013-10-27 DIAGNOSIS — N760 Acute vaginitis: Secondary | ICD-10-CM

## 2013-10-27 LAB — POCT WET PREP (WET MOUNT): CLUE CELLS WET PREP WHIFF POC: NEGATIVE

## 2013-10-27 LAB — POCT URINALYSIS DIPSTICK
BILIRUBIN UA: NEGATIVE
GLUCOSE UA: NEGATIVE
Ketones, UA: NEGATIVE
Leukocytes, UA: NEGATIVE
Nitrite, UA: NEGATIVE
PH UA: 5
Protein, UA: NEGATIVE
SPEC GRAV UA: 1.02
Urobilinogen, UA: NEGATIVE

## 2013-10-27 MED ORDER — FLUCONAZOLE 150 MG PO TABS: 150.0000 mg | ORAL_TABLET | Freq: Once | ORAL | Status: DC

## 2013-10-27 MED ORDER — NYSTATIN 100000 UNIT/GM EX CREA: 1.0000 "application " | TOPICAL_CREAM | Freq: Two times a day (BID) | CUTANEOUS | Status: DC

## 2013-10-27 NOTE — Progress Notes (Signed)
   Subjective:   Kristy [name] is a 43 y.o. female presenting on 10/27/2013 with vaginal discharge  Has slight vaginal discharge and irritation x 2 days.   Kind of like yeast infection.   Has different discharge, clear with odor, last week.   Has IUD x 59mo.  Has had some lower belly discomfort.   Current discharge, clear to foggish color, not so thick like yeast.   No odor currently.  No new soaps or hygiene products.  No urinary complaints, no blood, no frequency or urgency.  LMP 10/01/13.   Married, but separated.  Wants to check for STD just to be sure.   No recent antibiotics.  Has gotten a few yeast infections in the past.   Doesn't get UTI often either, 2 ever.  No prior STD.    Has had BV in the past.   No other aggravating or relieving factors.  No other complaint.  Review of Systems ROS as in subjective      Objective:     Filed Vitals:   10/27/13 0838  BP: 120/80  Pulse: 62  Temp: 98.4 F (36.9 C)  Resp: 14    General appearance: alert, no distress, WD/WN Abdomen: +bs, soft, non tender, non distended, no masses, no hepatomegaly, no splenomegaly Gyn: Normal external genitalia without lesions, slight erythema irritations at inferior vagina and perineal area, vagina with normal mucosa, cervix without lesions,slight creamy brown yellow discharge with slight bloody drainage, no other abnormal vaginal discharge, IUD strings in place.  Swabs taken.  Exam chaperoned by nurse.         Assessment: Encounter Diagnoses  Name Primary?  . Vaginitis and vulvovaginitis Yes  . Vaginal discharge   . IUD (intrauterine device) in place      Plan: Reviewed UA, wet prep. Wet prep with few yeasts.   Will send urine for GC/Chlamydia.  Begin Diflucan oral, can use topical Nystatin if desired.   F/u pending additional labs.    Kristy [name] was seen today for vaginal discharge.  Diagnoses and associated orders for this visit:  Vaginitis and vulvovaginitis - GC/Chlamydia Probe  Amp - POCT Wet Prep [facility]) - POCT urinalysis dipstick  Vaginal discharge - GC/Chlamydia Probe Amp - POCT Wet Prep [facility]) - POCT urinalysis dipstick  IUD (intrauterine device) in place     Return pending labs.

## 2013-10-28 LAB — GC/CHLAMYDIA PROBE AMP
CT Probe RNA: NEGATIVE
GC Probe RNA: NEGATIVE

## 2014-01-03 ENCOUNTER — Encounter: Payer: Self-pay | Admitting: Family Medicine

## 2014-01-03 ENCOUNTER — Ambulatory Visit (INDEPENDENT_AMBULATORY_CARE_PROVIDER_SITE_OTHER): Payer: BC Managed Care – PPO | Admitting: Family Medicine

## 2014-01-03 VITALS — BP 108/72 | HR 72 | Temp 98.3°F | Ht 65.0 in | Wt 210.0 lb

## 2014-01-03 DIAGNOSIS — J0110 Acute frontal sinusitis, unspecified: Secondary | ICD-10-CM

## 2014-01-03 MED ORDER — FLUCONAZOLE 150 MG PO TABS: 150.0000 mg | ORAL_TABLET | Freq: Once | ORAL | Status: DC

## 2014-01-03 MED ORDER — AMOXICILLIN 500 MG PO TABS: 1000.0000 mg | ORAL_TABLET | Freq: Two times a day (BID) | ORAL | Status: DC

## 2014-01-03 NOTE — Patient Instructions (Signed)
  Drink plenty of fluids. Continue the Aleve D as needed for sinus pain and congestion Consider adding guaifenesin (Mucinex or robitussin--PLAIN, not the D version), to help thin out the mucus to help it drain easier. Sinus rinses will also help with the sinus pressure/headaches.  You can use either the Sinus Rinse Kit or Neti-pot, once or twice daily.  Take the antibiotic as directed.  Call if you develop allergic reaction, or worsening symptoms despite treatment (ie fever). You may use the diflucan if yeast infection develops (wait until you have symptoms).

## 2014-01-03 NOTE — Progress Notes (Signed)
Chief Complaint  Patient presents with  . Nasal Congestion    x 2 weeks she has been having HA's and yellow colored mucus and fatigued.    Started 2 weeks ago with intermittent headaches and congestion.  The mucus has been yellow the whole time.  She has had increasing pressure in her forehead and between her eyes, as well as in her nose.  She is waking up sneezing.  She is blowing out discolored mucus daily.  She has some chest congestion starting.  She isn't coughing, and denies shortness of breath.  She felt hot yesterday (tactile fevers).  She has been taking Aleve-D on and off x 2 weeks.  She took 1/2 benadryl just 3 nights over the last 2 weeks.    Past Medical History  Diagnosis Date  . H. pylori infection 10/2010  . Iron deficiency anemia, unspecified   . Unspecified vitamin D deficiency   . Seasonal allergies     ragweed, tree pollen, mild and dust per testing 08/2012   Past Surgical History  Procedure Laterality Date  . Cesarean section    . Wisdom tooth extraction     History   Social History  . Marital Status: Married    Spouse Name: N/A    Number of Children: N/A  . Years of Education: N/A   Occupational History  . Not on file.   Social History Main Topics  . Smoking status: Never Smoker   . Smokeless tobacco: Never Used  . Alcohol Use: No     Comment: maybe one drink a year  . Drug Use: No  . Sexual Activity: Yes     Comment: partner had vasectomy   Other Topics Concern  . Not on file   Social History Narrative   Separated, in process of divorce, some walking, works as a Probation officer     Outpatient Encounter Prescriptions as of 01/03/2014  Medication Sig Note  . diphenhydrAMINE (BENADRYL) 25 MG tablet Take 12.5 mg by mouth every 6 (six) hours as needed. 01/03/2014: Took 1/2 tablet last night  . Pseudoephedrine-Naproxen Na (ALEVE-D SINUS & COLD) 120-220 MG TB12 Take 1 tablet by mouth 2 (two) times daily.   . cholecalciferol (VITAMIN D) 1000 UNITS tablet  Take 1,000 Units by mouth daily. 01/03/2014: Not taking  . [DISCONTINUED] fluconazole (DIFLUCAN) 150 MG tablet Take 1 tablet (150 mg total) by mouth once. 1 tablet po once, may repeat in 1 week   . [DISCONTINUED] nystatin cream (MYCOSTATIN) Apply 1 application topically 2 (two) times daily.    Allergies  Allergen Reactions  . Contrast Media [Iodinated Diagnostic Agents] Itching   ROS:  Denies ear pain, sore throat.  Denies vomiting.  +nausea yesterday, but also had some mild vertigo/dizziness.  Denies chest pain.  Some palpitations. Some tactile fevers, no chills.  PHYSICAL EXAM: BP 108/72  Pulse 72  Temp(Src) 98.3 F (36.8 C) (Oral)  Ht _0  (1.651 m)  Wt 210 lb (95.255 kg)  BMI 34.95 kg/m2  LMP 12/05/2013 Well developed, pleasant female in no distress HEENT:  PERRL, EOMI, conjunctiva clear.  TM's and EAC's normal.  Nasal mucosa is moderately edematous with thick yellow crusting bilaterally.  Sinuses are nontender.  OP is clear without erythema, moist mucus membranes Neck: no lymphadenopathy or mass Heart: regular rate and rhythm without murmur Lungs: clear bilaterally Skin: no rash  ASSESSMENT/PLAN:  Acute frontal sinusitis, recurrence not specified - Plan: amoxicillin (AMOXIL) 500 MG tablet   Drink plenty of fluids.  Continue the Aleve D as needed for sinus pain and congestion Consider adding guaifenesin (Mucinex or robitussin--PLAIN, not the D version), to help thin out the mucus to help it drain easier. Sinus rinses will also help with the sinus pressure/headaches.  You can use either the Sinus Rinse Kit or Neti-pot, once or twice daily.  Take the antibiotic as directed.  Call if you develop allergic reaction, or worsening symptoms despite treatment (ie fever). You may use the diflucan if yeast infection develops (wait until you have symptoms).

## 2014-01-16 ENCOUNTER — Other Ambulatory Visit: Payer: Self-pay | Admitting: Family Medicine

## 2014-03-09 ENCOUNTER — Other Ambulatory Visit: Payer: Self-pay | Admitting: Family Medicine

## 2014-04-13 ENCOUNTER — Ambulatory Visit: Payer: Self-pay

## 2014-06-21 ENCOUNTER — Institutional Professional Consult (permissible substitution): Payer: BC Managed Care – PPO | Admitting: Medical

## 2014-06-22 ENCOUNTER — Encounter: Payer: Self-pay | Admitting: Medical

## 2014-06-22 ENCOUNTER — Ambulatory Visit (INDEPENDENT_AMBULATORY_CARE_PROVIDER_SITE_OTHER): Payer: BC Managed Care – PPO | Admitting: Medical

## 2014-06-22 VITALS — BP 110/80 | HR 80 | Temp 98.1°F | Resp 16 | Ht 65.0 in | Wt 225.0 lb

## 2014-06-22 DIAGNOSIS — D539 Nutritional anemia, unspecified: Secondary | ICD-10-CM

## 2014-06-22 DIAGNOSIS — E669 Obesity, unspecified: Secondary | ICD-10-CM

## 2014-06-22 DIAGNOSIS — E559 Vitamin D deficiency, unspecified: Secondary | ICD-10-CM

## 2014-06-22 LAB — CBC
HCT: 37.7 % (ref 36.0–46.0)
HEMOGLOBIN: 12.7 g/dL (ref 12.0–15.0)
MCH: 28.7 pg (ref 26.0–34.0)
MCHC: 33.7 g/dL (ref 30.0–36.0)
MCV: 85.1 fL (ref 78.0–100.0)
MPV: 9.6 fL (ref 9.4–12.4)
Platelets: 261 10*3/uL (ref 150–400)
RBC: 4.43 MIL/uL (ref 3.87–5.11)
RDW: 14.2 % (ref 11.5–15.5)
WBC: 5.2 10*3/uL (ref 4.0–10.5)

## 2014-06-22 MED ORDER — PHENTERMINE-TOPIRAMATE ER 7.5-46 MG PO CP24: 1.0000 | ORAL_CAPSULE | ORAL | Status: DC

## 2014-06-22 MED ORDER — VITAMIN D (ERGOCALCIFEROL) 1.25 MG (50000 UNIT) PO CAPS: 50000.0000 [IU] | ORAL_CAPSULE | ORAL | Status: DC

## 2014-06-22 MED ORDER — PHENTERMINE-TOPIRAMATE ER 3.75-23 MG PO CP24: 1.0000 | ORAL_CAPSULE | ORAL | Status: DC

## 2014-06-22 NOTE — Progress Notes (Signed)
Subjective: Here for consult for weight loss medication.  Was on phentermine for 2 months a few years ago, ended up losing 38lb.  Had some anxiety on the medication, but no headaches, no sleep problems, no chest pain.  Wants to lose 50lb, exercise 4 days per week.  Eating healthier of late, trying to get back into a regimen.  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  Otherwise in normal state of health without c/o.   Past Medical History  Diagnosis Date  . H. pylori infection 10/2010  . Iron deficiency anemia, unspecified   . Unspecified vitamin D deficiency   . Seasonal allergies     ragweed, tree pollen, mild and dust per testing 08/2012   ROS as in subjective   Objective: BP 110/80 mmHg  Pulse 80  Temp(Src) 98.1 F (36.7 C) (Oral)  Resp 16  Ht 5\' 5"  (1.651 m)  Wt 225 lb (102.059 kg)  BMI 37.44 kg/m2  Wt Readings from Last 3 Encounters:  06/22/14 225 lb (102.059 kg)  01/03/14 210 lb (95.255 kg)  10/27/13 210 lb (95.255 kg)    General appearance: alert, no distress, WD/WN Oral cavity: MMM, no lesions Neck: supple, no lymphadenopathy, no thyromegaly, no masses Heart: RRR, normal S1, S2, no murmurs Lungs: CTA bilaterally, no wheezes, rhonchi, or rales Pulses: 2+ symmetric, upper and lower extremities, normal cap refill Ext: no edema   Assessment: Encounter Diagnoses  Name Primary?  . Obesity Yes  . Vitamin D deficiency   . Deficiency anemia    Plan: We discussed her concerns, prior weight loss medications. Discussed diet, exercise, medication options.  Begin Qysmia.   Discussed risk and benefits of medication, expected weight loss, water intake, using phone app such as My Fitness PAL to track calories. Recheck in [place] today for Vit D, cbc.   Of note she has IUD in place, hx/o hematuria with prior Urology consult.

## 2014-06-22 NOTE — Patient Instructions (Signed)
  Thank you for giving me the opportunity to serve you today.    Your diagnosis today includes: Encounter Diagnoses  Name Primary?  . Obesity Yes  . Vitamin D deficiency   . Deficiency anemia      Specific recommendations today include: Diet  Increase your water intake, get at least 64 ounces of water daily  Eat 3-4 fruits daily  Eat plenty of vegetables throughout the day, preferably each meal  Eat good sources of grains such as oatmeal, barley, whole grain pasta, whole grain bread, but limit the serving size to 1 cup of oatmeal or pasta per meal or 2 slices of bread per meal  We don't need to meat at each meal, however if you do eat meat, limit serving size to the size of your palm, and eat chicken fish or [place], lean cuts of meat  Eat beans every day as this is a good nutrient source and helps to curb appetite  Consider using a program such as Weight Watchers  Consider using a Smart phone app such as My Fitness PAL or [employer] to track your calories and progress   Things to limit or avoid:  Avoid fast food, fried foods, fatty foods  Limit sweets, ice cream, cake and other baked goods  Avoid soda, beer, alcohol, sweet tea  Exercise  You need to be exercising most days of the week for 30-45 minutes or more  Good forms of exercise include walking, hiking, stationary bike or bicycling outside, lap swimming, aerobics class, dance, Zumba  Consider getting a trainer at a gym to help with exercise  Medication  Begin Qsymia weight loss medication  Start by taking the Qsymia 3.75/23 mg dose, once daily in the morning before breakfast for the first 2 weeks  Then increase to the Qsymia 7.5/46mg  dose, once daily in the morning before breakfast  You will need to use the coupon card to call and activate the medication  If your insurance does not cover the weight loss medicine listed above, check on the insurance coverage for the following  medications:  Belviq  Contrave  Consider weighing yourself daily to keep track of your weight   Return pending labs.

## 2014-06-23 ENCOUNTER — Other Ambulatory Visit: Payer: Self-pay | Admitting: Medical

## 2014-06-23 LAB — VITAMIN D 25 HYDROXY (VIT D DEFICIENCY, FRACTURES): Vit D, 25-Hydroxy: 22 ng/mL — ABNORMAL LOW (ref 30–100)

## 2014-06-23 MED ORDER — VITAMIN D (ERGOCALCIFEROL) 1.25 MG (50000 UNIT) PO CAPS: 50000.0000 [IU] | ORAL_CAPSULE | ORAL | Status: DC

## 2014-08-24 ENCOUNTER — Ambulatory Visit (INDEPENDENT_AMBULATORY_CARE_PROVIDER_SITE_OTHER): Payer: BLUE CROSS/BLUE SHIELD | Admitting: Family Medicine

## 2014-08-24 ENCOUNTER — Encounter: Payer: Self-pay | Admitting: Family Medicine

## 2014-08-24 VITALS — BP 124/80 | HR 88 | Ht 65.0 in | Wt 220.0 lb

## 2014-08-24 DIAGNOSIS — B9689 Other specified bacterial agents as the cause of diseases classified elsewhere: Secondary | ICD-10-CM

## 2014-08-24 DIAGNOSIS — N898 Other specified noninflammatory disorders of vagina: Secondary | ICD-10-CM

## 2014-08-24 DIAGNOSIS — E669 Obesity, unspecified: Secondary | ICD-10-CM

## 2014-08-24 DIAGNOSIS — N760 Acute vaginitis: Secondary | ICD-10-CM

## 2014-08-24 DIAGNOSIS — A499 Bacterial infection, unspecified: Secondary | ICD-10-CM

## 2014-08-24 LAB — POCT WET PREP (WET MOUNT): CLUE CELLS WET PREP WHIFF POC: POSITIVE

## 2014-08-24 MED ORDER — TINIDAZOLE 500 MG PO TABS: 2.0000 g | ORAL_TABLET | Freq: Every day | ORAL | Status: DC

## 2014-08-24 MED ORDER — METRONIDAZOLE 0.75 % VA GEL: VAGINAL | Status: DC

## 2014-08-24 NOTE — Progress Notes (Signed)
Chief Complaint  Patient presents with  . Vaginal Discharge    over the last two days noticed some vaginal discharge that is white and has an odor. No itching.    Unable to get to her GYN in [place] today, who has been treating her for BV.  She seems to have problem with vaginal discharge every month after her cycles finishes.  Discharge is white, with a small odor.  No associated itching.  She was last evaluated by her GYN last month (rx's in outside med rec section give date of 1/11) and treated with oral flagyl, as well as a diflucan.  She feels like this is the same BV that she has been getting every month.   Married, monogamous, no concerns for STDs.  Other medications listed in med rec section include: metrogel vaginal 11/23 Nystatin-TAC cream 11/22 (she had a rash also at that time) Tinidazole 500mg  ( 4 pills x 2 days), filled on 12/2.    She admits to having trouble taking flagyl every twelve hours x 7 days (took her 8 days to complete the last course--didn't realize it could be twice daily, was trying for exactly 12 hours). She felt like the tinidazole worked better--didn't have to take as many pills, but she worries that it wasn't long enough, and so recurred.  PMH, PSH, SH reviewed  Meds: Vitamin D 50,000 q week. (not taking Qsymia).  Allergies  Allergen Reactions  . Contrast Media [Iodinated Diagnostic Agents] Itching   ROS:  No pelvic pain, fever, chills, nausea, vomiting, bowel changes, bleeding, bruising, rashes. No urinary complaints or URI, cough, shortness of breath, chest pain.   PHYSICAL EXAM: BP 124/80 mmHg  Pulse 88  Ht 5\' 5"  (1.651 m)  Wt 220 lb (99.791 kg)  BMI 36.61 kg/m2  Well developed, pleasant female in no distress External genitalia is normal with inflammation or lesions. Speculum exam:  Moderate amount of thin, creamy, odorous discharge Cervix is without lesions  Wet prep:  Many clue cells.  Otherwise normal. No  yeast  ASSESSMENT/PLAN:  Bacterial vaginosis - recurrent.  treat/eradicate with tinidazole, followed by prevention with metrogel 2x/week for up to 4-6 mos. f/u with GYN if not working - Plan: tinidazole (TINDAMAX) 500 MG tablet, metroNIDAZOLE (METROGEL) 0.75 % vaginal gel  Vaginal discharge - Plan: POCT Wet Prep [employer] [facility])  Obesity - discussed benefits of Qsymia over phentermine; reconsider. f/u with [name]   Reviewed all of the various regimens. She doesn't like the metrogel--gets clumpy, but willing to try for prevention. Discussed using oral med for eradication, followed by metrogel 2x/week intravaginally for up to 4-6 months.  If not liking this, or if ineffective, f/u with GYN for other possible regimens.  Discussed phentermine vs Qsymia, that dose needs to be titrate up until effective, and ability to use it for longer, and for maintenance (vs only using phentermine for 12 weeks, and often regaining the weight that was lost).  Her concern was that she tried the sample, and she still felt hungry, and also the cost was significantly more than the phentermine.  Encouraged to reconsider Qsymia and f/u with [name]

## 2014-08-24 NOTE — Patient Instructions (Signed)
Bacterial Vaginosis Bacterial vaginosis is a vaginal infection that occurs when the normal balance of bacteria in the vagina is disrupted. It results from an overgrowth of certain bacteria. This is the most common vaginal infection in women of childbearing age. Treatment is important to prevent complications, especially in pregnant women, as it can cause a premature delivery. CAUSES  Bacterial vaginosis is caused by an increase in harmful bacteria that are normally present in smaller amounts in the vagina. Several different kinds of bacteria can cause bacterial vaginosis. However, the reason that the condition develops is not fully understood. RISK FACTORS Certain activities or behaviors can put you at an increased risk of developing bacterial vaginosis, including:  Having a new sex partner or multiple sex partners.  Douching.  Using an intrauterine device (IUD) for contraception. Women do not get bacterial vaginosis from toilet seats, bedding, swimming pools, or contact with objects around them. SIGNS AND SYMPTOMS  Some women with bacterial vaginosis have no signs or symptoms. Common symptoms include:  Grey vaginal discharge.  A fishlike odor with discharge, especially after sexual intercourse.  Itching or burning of the vagina and vulva.  Burning or pain with urination. DIAGNOSIS  Your health care provider will take a medical history and examine the vagina for signs of bacterial vaginosis. A sample of vaginal fluid may be taken. Your health care provider will look at this sample under a microscope to check for bacteria and abnormal cells. A vaginal pH test may also be done.  TREATMENT  Bacterial vaginosis may be treated with antibiotic medicines. These may be given in the form of a pill or a vaginal cream. A second round of antibiotics may be prescribed if the condition comes back after treatment.  HOME CARE INSTRUCTIONS   Only take over-the-counter or prescription medicines as  directed by your health care provider.  If antibiotic medicine was prescribed, take it as directed. Make sure you finish it even if you start to feel better.  Do not have sex until treatment is completed.  Tell all sexual partners that you have a vaginal infection. They should see their health care provider and be treated if they have problems, such as a mild rash or itching.  Practice safe sex by using condoms and only having one sex partner. SEEK MEDICAL CARE IF:   Your symptoms are not improving after 3 days of treatment.  You have increased discharge or pain.  You have a fever. MAKE SURE YOU:   Understand these instructions.  Will watch your condition.  Will get help right away if you are not doing well or get worse. FOR MORE INFORMATION  Centers for Disease Control and Prevention, Division of STD Prevention: www.cdc.gov/std American Sexual Health Association (ASHA): www.ashastd.org  Document Released: 07/01/2005 Document Revised: 04/21/2013 Document Reviewed: 02/10/2013 ExitCare Patient Information 2015 [employer]. This information is not intended to replace advice given to you by your health care provider. Make sure you discuss any questions you have with your health care provider.  

## 2014-11-08 ENCOUNTER — Telehealth: Payer: Self-pay | Admitting: Medical

## 2014-11-08 ENCOUNTER — Encounter: Payer: Self-pay | Admitting: Medical

## 2014-11-08 ENCOUNTER — Ambulatory Visit (INDEPENDENT_AMBULATORY_CARE_PROVIDER_SITE_OTHER): Payer: BLUE CROSS/BLUE SHIELD | Admitting: Medical

## 2014-11-08 VITALS — BP 100/70 | HR 93 | Resp 15 | Wt 222.0 lb

## 2014-11-08 DIAGNOSIS — N898 Other specified noninflammatory disorders of vagina: Secondary | ICD-10-CM | POA: Diagnosis not present

## 2014-11-08 DIAGNOSIS — R059 Cough, unspecified: Secondary | ICD-10-CM

## 2014-11-08 DIAGNOSIS — J329 Chronic sinusitis, unspecified: Secondary | ICD-10-CM

## 2014-11-08 DIAGNOSIS — R05 Cough: Secondary | ICD-10-CM

## 2014-11-08 DIAGNOSIS — J40 Bronchitis, not specified as acute or chronic: Secondary | ICD-10-CM

## 2014-11-08 DIAGNOSIS — E669 Obesity, unspecified: Secondary | ICD-10-CM

## 2014-11-08 MED ORDER — PHENTERMINE HCL 37.5 MG PO TABS: 37.5000 mg | ORAL_TABLET | Freq: Every day | ORAL | Status: DC

## 2014-11-08 MED ORDER — BENZONATATE 200 MG PO CAPS: 200.0000 mg | ORAL_CAPSULE | Freq: Three times a day (TID) | ORAL | Status: DC | PRN

## 2014-11-08 MED ORDER — AZITHROMYCIN 250 MG PO TABS: ORAL_TABLET | ORAL | Status: DC

## 2014-11-08 NOTE — Progress Notes (Signed)
Subjective She reports cough x 1 week.  Has had some head and chest congestion x 2 weeks, getting worse.  Has some headaches.  Does get a little SOB.  SOB and cough worse at night. No wheezing. Has sneezing some in mornings, but denies other allergy symptoms.  Denies sore throat, sinus pain, ear pain.   No sick contacts.  Using nothing for this other than sinus medication OTC.   Nonsmoker.   No other aggravating or relieving factors.   She reports vaginal discharge 4 days, white, mild odor.  No recent hygiene product changes.  Married.  No concern for STD.  Gets similar discharge maybe once every 3 months.   LMP 1.5 week ago.  No urinary c/o.   Periods are regular.  Last pap a year ago normal in [place].  No recent antibiotics.  Wants to go back to phentermine.  qsymia too expensive.  Was on phentermine for 2 months a few years ago, ended up losing 38lb.  Had some anxiety on the medication, but no headaches, no sleep problems, no chest pain.  Wants to lose 50lb, exercise 4 days per week.  Eating healthier of late, trying to get back into a regimen.  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  Otherwise in normal state of health without c/o.   Past Medical History  Diagnosis Date  . H. pylori infection 10/2010  . Iron deficiency anemia, unspecified   . Unspecified vitamin D deficiency   . Seasonal allergies     ragweed, tree pollen, mild and dust per testing 08/2012   ROS as in subjective   Objective: BP 100/70 mmHg  Pulse 93  Resp 15  Wt 222 lb (100.699 kg)  Wt Readings from Last 3 Encounters:  11/08/14 222 lb (100.699 kg)  08/24/14 220 lb (99.791 kg)  06/22/14 225 lb (102.059 kg)    General appearance: alert, no distress, WD/WN Oral cavity: MMM, no lesions HENT with some post nasal drainage, otherwise unremarkable Neck: supple, no lymphadenopathy, no thyromegaly, no masses Heart: RRR, normal S1, S2, no murmurs Lungs: few scattered rhonchi, no wheezes, rhonchi, or rales Pulses: 2+  symmetric, upper and lower extremities, normal cap refill Ext: no edema External genitalia normal, slight amount of white creamy discharge, otherwise pelvis exam unremarkable, exam chaperoned by nurse   Assessment: Encounter Diagnoses  Name Primary?  . Vaginal discharge Yes  . Obesity   . Cough   . Sinobronchitis    Plan: Vagina discharge - not complaint with metrogel recommended by Dr. Tomi Bamberger here last visit .  Will send for GC/Chlamydia and f/u pending results.   Obesity - declined Qsymia due to cost.  Restart Phentermine short term, discussed her concerns, prior weight loss medications. Discussed diet, exercise, medication options.   Discussed risk and benefits of medication, expected weight loss, water intake, using phone app such as My Fitness PAL to track calories. Recheck in 4-6wk  Of note she has IUD in place, hx/o hematuria with prior Urology consult.   sinobronchitis - tessalon perles, zpak, rest, hydrate well, f/u if not improving.

## 2014-11-08 NOTE — Addendum Note (Signed)
Addended by: Carlena Hurl on: 11/08/2014 01:20 PM   Modules accepted: Orders

## 2014-11-08 NOTE — Telephone Encounter (Signed)
Please call her today Tuesday.  After looking back, she saw Dr. Tomi Bamberger in 08/2014 for the same discharge.  At that time Dr. Tomi Bamberger had her do Tinidazole + ongoing use of Metrogel 2 x/week for 4- 6 months.  Is she using the metrogel 2 x/ week?   Does she still want me to send the GC/Chlamydia swab?   Dr. Tomi Bamberger had suggested that if she was not seeing improvements or wasn't going to use the Metrogel for 4-6 mo, then it would probably be helpful to revisit with gynecology for other suggestions.  I can't say I have any other recommendations either at this time, but find out if she is actually using the Metrogel regularly?  Andria Frames - call out phentermine and have her f/u in 4-6 wk for med check on this.

## 2014-11-08 NOTE — Telephone Encounter (Signed)
Patient states that she is not taking either of the 2 medication prescribed by Dr. Tomi Bamberger anymore. Patient doesn't want to take the metrogel because clumpy stuff comes out of her vagine. She said to go ahead and send the [facility] culture. She doesn't think it will show anything but send anyway. I called out her Phentermine to her pharmacy per Chana Bode PA-C

## 2014-11-09 LAB — GC/CHLAMYDIA PROBE AMP
CT Probe RNA: NEGATIVE
GC Probe RNA: NEGATIVE

## 2014-11-10 ENCOUNTER — Other Ambulatory Visit: Payer: Self-pay | Admitting: Medical

## 2014-11-10 MED ORDER — BORIC ACID CRYS: 600.0000 mg | CRYSTALS | Freq: Every day | Status: DC

## 2014-11-19 ENCOUNTER — Telehealth: Payer: Self-pay | Admitting: Medical

## 2014-12-06 ENCOUNTER — Other Ambulatory Visit: Payer: Self-pay | Admitting: Family Medicine

## 2014-12-06 MED ORDER — BORIC ACID CRYS: 600.0000 mg | CRYSTALS | Freq: Every day | Status: DC

## 2014-12-06 NOTE — Telephone Encounter (Signed)
P.A. Approved Phentermine but pt is limited to 12 weeks of therapy per year

## 2015-04-06 LAB — HM PAP SMEAR: HM Pap smear: NEGATIVE

## 2015-04-06 LAB — HM MAMMOGRAPHY

## 2015-04-19 ENCOUNTER — Telehealth: Payer: Self-pay

## 2015-05-04 ENCOUNTER — Encounter: Payer: Self-pay | Admitting: Family Medicine

## 2015-05-09 DIAGNOSIS — Z0000 Encounter for general adult medical examination without abnormal findings: Secondary | ICD-10-CM

## 2015-05-10 ENCOUNTER — Encounter: Payer: Self-pay | Admitting: Medical

## 2015-05-10 ENCOUNTER — Telehealth: Payer: Self-pay

## 2015-05-10 NOTE — Telephone Encounter (Signed)
D and charge for no show fee for physical

## 2015-05-10 NOTE — Telephone Encounter (Signed)
This patient no showed for their appointment today.Which of the following is necessary for this patient.   A) No follow-up necessary   B) Follow-up urgent. Locate Patient Immediately.   C) Follow-up necessary. Contact patient and Schedule visit in ____ Days.   D) Follow-up Advised. Contact patient and Schedule visit in ____ Days. 

## 2015-05-12 ENCOUNTER — Encounter: Payer: Self-pay | Admitting: Medical

## 2015-05-12 NOTE — Telephone Encounter (Signed)
No show letter with fee sent

## 2015-05-16 ENCOUNTER — Other Ambulatory Visit: Payer: Self-pay | Admitting: Medical

## 2015-05-17 NOTE — Telephone Encounter (Signed)
Is this ok to refill?  

## 2015-05-18 ENCOUNTER — Other Ambulatory Visit: Payer: Self-pay | Admitting: Medical

## 2015-05-18 NOTE — Telephone Encounter (Signed)
Is this ok to refill?  

## 2015-05-29 NOTE — Telephone Encounter (Signed)
erroneous

## 2015-05-31 ENCOUNTER — Other Ambulatory Visit: Payer: Self-pay | Admitting: Medical

## 2015-06-01 NOTE — Telephone Encounter (Signed)
Is this okay to refill? 

## 2015-06-28 ENCOUNTER — Ambulatory Visit (INDEPENDENT_AMBULATORY_CARE_PROVIDER_SITE_OTHER): Payer: BLUE CROSS/BLUE SHIELD | Admitting: Medical

## 2015-06-28 ENCOUNTER — Encounter: Payer: Self-pay | Admitting: Medical

## 2015-06-28 VITALS — BP 114/76 | HR 64 | Resp 12 | Ht 65.0 in | Wt 201.8 lb

## 2015-06-28 DIAGNOSIS — E669 Obesity, unspecified: Secondary | ICD-10-CM | POA: Insufficient documentation

## 2015-06-28 DIAGNOSIS — D509 Iron deficiency anemia, unspecified: Secondary | ICD-10-CM | POA: Insufficient documentation

## 2015-06-28 DIAGNOSIS — E559 Vitamin D deficiency, unspecified: Secondary | ICD-10-CM | POA: Diagnosis not present

## 2015-06-28 DIAGNOSIS — Z1389 Encounter for screening for other disorder: Secondary | ICD-10-CM | POA: Insufficient documentation

## 2015-06-28 DIAGNOSIS — R3129 Other microscopic hematuria: Secondary | ICD-10-CM | POA: Diagnosis not present

## 2015-06-28 DIAGNOSIS — Z0000 Encounter for general adult medical examination without abnormal findings: Secondary | ICD-10-CM | POA: Diagnosis not present

## 2015-06-28 LAB — POCT URINALYSIS DIPSTICK
Bilirubin, UA: NEGATIVE
Glucose, UA: NEGATIVE
KETONES UA: NEGATIVE
Leukocytes, UA: NEGATIVE
Nitrite, UA: NEGATIVE
PH UA: 6.5
PROTEIN UA: NEGATIVE
UROBILINOGEN UA: NEGATIVE

## 2015-06-28 LAB — COMPREHENSIVE METABOLIC PANEL
ALBUMIN: 3.6 g/dL (ref 3.6–5.1)
ALK PHOS: 35 U/L (ref 33–115)
ALT: 11 U/L (ref 6–29)
AST: 17 U/L (ref 10–30)
BUN: 15 mg/dL (ref 7–25)
CHLORIDE: 103 mmol/L (ref 98–110)
CO2: 26 mmol/L (ref 20–31)
Calcium: 8.4 mg/dL — ABNORMAL LOW (ref 8.6–10.2)
Creat: 0.95 mg/dL (ref 0.50–1.10)
Glucose, Bld: 82 mg/dL (ref 65–99)
POTASSIUM: 3.9 mmol/L (ref 3.5–5.3)
Sodium: 136 mmol/L (ref 135–146)
TOTAL PROTEIN: 6.4 g/dL (ref 6.1–8.1)
Total Bilirubin: 0.2 mg/dL (ref 0.2–1.2)

## 2015-06-28 LAB — IRON AND TIBC
%SAT: 13 % (ref 11–50)
IRON: 44 ug/dL (ref 40–190)
TIBC: 332 ug/dL (ref 250–450)
UIBC: 288 ug/dL (ref 125–400)

## 2015-06-28 LAB — LIPID PANEL
CHOL/HDL RATIO: 3.9 ratio (ref <=5.0)
CHOLESTEROL: 172 mg/dL (ref 125–200)
HDL: 44 mg/dL — ABNORMAL LOW (ref >=46)
LDL Cholesterol: 114 mg/dL (ref <130)
TRIGLYCERIDES: 70 mg/dL (ref <150)
VLDL: 14 mg/dL (ref <30)

## 2015-06-28 LAB — CBC
HEMATOCRIT: 34 % — AB (ref 36.0–46.0)
Hemoglobin: 11.2 g/dL — ABNORMAL LOW (ref 12.0–15.0)
MCH: 28.3 pg (ref 26.0–34.0)
MCHC: 32.9 g/dL (ref 30.0–36.0)
MCV: 85.9 fL (ref 78.0–100.0)
MPV: 10.1 fL (ref 8.6–12.4)
PLATELETS: 268 10*3/uL (ref 150–400)
RBC: 3.96 MIL/uL (ref 3.87–5.11)
RDW: 14.9 % (ref 11.5–15.5)
WBC: 5.7 10*3/uL (ref 4.0–10.5)

## 2015-06-28 MED ORDER — PHENTERMINE HCL 37.5 MG PO TABS: 37.5000 mg | ORAL_TABLET | Freq: Every day | ORAL | Status: DC

## 2015-06-28 NOTE — Progress Notes (Signed)
Subjective:   HPI  Kristy [name] is a 44 y.o. female who presents for a complete physical.  Last physical over 1 year ago.     Preventative care: Doing fine.  Wants to restart phentermine for weight loss.  Has lost 30lb in the last 2 years, 20 lb in last 31mo.   Is really working hard at weight loss changes.  Gets mammograms regularly, just had pap recently with Dr. Kenton Kingfisher, [facility].  Has IUD in place.   Reviewed their medical, surgical, family, social, medication, and allergy history and updated chart as appropriate.   Past Medical History  Diagnosis Date  . H. pylori infection 10/2010  . Iron deficiency anemia, unspecified   . Unspecified vitamin D deficiency   . Seasonal allergies     ragweed, tree pollen, mild and dust per testing 08/2012    Past Surgical History  Procedure Laterality Date  . Cesarean section    . Wisdom tooth extraction      Family History  Problem Relation Age of Onset  . Heart disease Mother 66    stent, CAD  . Cancer Mother 40    breast, with recurrence  . Cancer Father     esophageal cancer  . Heart disease Father 27    died of MI  . Alcohol abuse Father   . Hypertension Sister   . Hypertension Brother   . Cancer Maternal Grandmother     breast  . Diabetes Neg Hx   . Stroke Neg Hx   . Hypertension Brother     Social History   Social History  . Marital Status: Married    Spouse Name: N/A  . Number of Children: N/A  . Years of Education: N/A   Occupational History  . Not on file.   Social History Main Topics  . Smoking status: Never Smoker   . Smokeless tobacco: Never Used  . Alcohol Use: No     Comment: maybe one drink a year  . Drug Use: No  . Sexual Activity: Yes     Comment: partner had vasectomy   Other Topics Concern  . Not on file   Social History Narrative   In relationship x 6 mo, divorced, 1 child age 39yo, exercise with classes, running, works as a Probation officer    Current Outpatient Prescriptions on  File Prior to Visit  Medication Sig Dispense Refill  . Vitamin D, Ergocalciferol, (DRISDOL) 50000 UNITS CAPS capsule Take 1 capsule (50,000 Units total) by mouth every 7 (seven) days. 4.5 capsule 3   No current facility-administered medications on file prior to visit.    Allergies  Allergen Reactions  . Contrast Media [Iodinated Diagnostic Agents] Itching      Review of Systems Constitutional: -fever, -chills, -sweats, -unexpected weight change, -decreased appetite, +fatigue Allergy: -sneezing, -itching, -congestion Dermatology: -changing moles, --rash, -lumps ENT: -runny nose, -ear pain, -sore throat, -hoarseness, +sinus pain, -teeth pain, - ringing in ears, -hearing loss, -nosebleeds Cardiology: -chest pain, -palpitations, -swelling, -difficulty breathing when lying flat, -waking up short of breath Respiratory: -cough, -shortness of breath, -difficulty breathing with exercise or exertion, -wheezing, -coughing up blood Gastroenterology: -abdominal pain, -nausea, -vomiting, -diarrhea, -constipation, -blood in stool, -changes in bowel movement, -difficulty swallowing or eating Hematology: -bleeding, -bruising  Musculoskeletal: +joint aches, -muscle aches, -joint swelling, -back pain, -neck pain, -cramping, -changes in gait Ophthalmology: denies vision changes, eye redness, itching, discharge Urology: -burning with urination, -difficulty urinating, -blood in urine, -urinary frequency, -urgency, -incontinence Neurology: -headache, -  weakness, +tingling, -numbness, -memory loss, -falls, -dizziness Psychology: -depressed mood, -agitation, -sleep problems     Objective:   Physical Exam  BP 114/76 mmHg  Pulse 64  Resp 12  Ht 5\' 5"  (1.651 m)  Wt 201 lb 12.8 oz (91.536 kg)  BMI 33.58 kg/m2  General appearance: alert, no distress, WD/WN, AA female Skin: few small skin tags left neck HEENT: normocephalic, conjunctiva/corneas normal, sclerae anicteric, PERRLA, EOMi, nares patent, no  discharge or erythema, pharynx normal Oral cavity: MMM, tongue normal, teeth in good repair Neck: supple, no lymphadenopathy, no thyromegaly, no masses, normal ROM, no bruits Chest: non tender, normal shape and expansion Heart: RRR, normal S1, S2, no murmurs Lungs: CTA bilaterally, no wheezes, rhonchi, or rales Abdomen: +bs, soft, non tender, non distended, no masses, no hepatomegaly, no splenomegaly, no bruits Back: non tender, normal ROM, no scoliosis Musculoskeletal: upper extremities non tender, no obvious deformity, normal ROM throughout, lower extremities non tender, no obvious deformity, normal ROM throughout Extremities: no edema, no cyanosis, no clubbing Pulses: 2+ symmetric, upper and lower extremities, normal cap refill Neurological: alert, oriented x 3, -phalens and tinels, CN2-12 intact, strength normal upper extremities and lower extremities, sensation normal throughout, DTRs 2+ throughout, no cerebellar signs, gait normal Psychiatric: normal affect, behavior normal, pleasant  Breast/gyn/rectal:  deferred    Assessment and Plan :    Encounter Diagnoses  Name Primary?  . Encounter for health maintenance examination in adult Yes  . Obesity   . Vitamin D deficiency   . Anemia, iron deficiency   . Microscopic hematuria     Physical exam - discussed healthy lifestyle, diet, exercise, preventative care, vaccinations, and addressed their concerns.   See your eye doctor yearly for routine vision care. See your dentist yearly for routine dental care including hygiene visits twice yearly. See your gynecologist yearly for routine gynecological care.  Obesity - discussed need to work on diet and exercise changes.  Can go one more round of phentermine.  She is using 1/2 tablet every now and then, not daily.  She will check insurance coverage for Gildardo Griffes, Saxenda.  Discussed that we couldn't c/t phentermine ongoing due to risks  Vit D deficiency  - recheck labs, c/t Vit D  50k units weekly  Hx/o anemia - labs today  Hematuria microscopic - but on period today.  Follow-up pending labs

## 2015-06-28 NOTE — Patient Instructions (Signed)
Check insurance coverage for Qsymia, Contrave and Saxenda.

## 2015-06-29 ENCOUNTER — Other Ambulatory Visit: Payer: Self-pay | Admitting: Medical

## 2015-06-29 LAB — HEMOGLOBIN A1C
Hgb A1c MFr Bld: 5.6 % (ref <5.7)
Mean Plasma Glucose: 114 mg/dL (ref <117)

## 2015-06-29 LAB — VITAMIN D 25 HYDROXY (VIT D DEFICIENCY, FRACTURES): VIT D 25 HYDROXY: 26 ng/mL — AB (ref 30–100)

## 2015-06-29 MED ORDER — FERROUS SULFATE 325 (65 FE) MG PO TABS: 325.0000 mg | ORAL_TABLET | Freq: Every day | ORAL | Status: DC

## 2015-06-29 MED ORDER — PHENTERMINE-TOPIRAMATE ER 7.5-46 MG PO CP24: 1.0000 | ORAL_CAPSULE | ORAL | Status: DC

## 2015-07-16 DIAGNOSIS — E559 Vitamin D deficiency, unspecified: Secondary | ICD-10-CM

## 2015-07-16 HISTORY — DX: Vitamin D deficiency, unspecified: E55.9

## 2015-09-07 ENCOUNTER — Other Ambulatory Visit: Payer: BLUE CROSS/BLUE SHIELD

## 2015-09-07 ENCOUNTER — Encounter: Payer: Self-pay | Admitting: *Deleted

## 2015-09-07 NOTE — Patient Instructions (Signed)
  Your procedure is scheduled on: 09-14-15 (THURSDAY) Report to [place] To find out your arrival time please call [phone] between 1PM - 3PM on 09-13-15 [facility])  Remember: Instructions that are not followed completely may result in serious medical risk, up to and including death, or upon the discretion of your surgeon and anesthesiologist your surgery may need to be rescheduled.    _X___ 1. Do not eat food or drink liquids after midnight. No gum chewing or hard candies.     _X___ 2. No Alcohol for 24 hours before or after surgery.   ____ 3. Bring all medications with you on the day of surgery if instructed.    _X___ 4. Notify your doctor if there is any change in your medical condition     (cold, fever, infections).     Do not wear jewelry, make-up, hairpins, clips or nail polish.  Do not wear lotions, powders, or perfumes. You may wear deodorant.  Do not shave 48 hours prior to surgery. Men may shave face and neck.  Do not bring valuables to the hospital.    [facility] is not responsible for any belongings or valuables.               Contacts, dentures or bridgework may not be worn into surgery.  Leave your suitcase in the car. After surgery it may be brought to your room.  For patients admitted to the hospital, discharge time is determined by your treatment team.   Patients discharged the day of surgery will not be allowed to drive home.   Please read over the following fact sheets that you were given:     ____ Take these medicines the morning of surgery with A SIP OF WATER:    1. NONE  2.   3.   4.  5.  6.  ____ Fleet Enema (as directed)   _X___ Use CHG Soap as directed  ____ Use inhalers on the day of surgery  ____ Stop metformin 2 days prior to surgery    ____ Take 1/2 of usual insulin dose the night before surgery and none on the morning of surgery.   ____ Stop Coumadin/Plavix/aspirin-N/A  ____ Stop Anti-inflammatories-NO  NSAIDS OR ASPIRIN PRODUCTS-TYLENOL OK TO TAKE   _X___ Stop supplements until after surgery-STOP PHENTERMINE NOW   ____ Bring C-Pap to the hospital.

## 2015-09-11 ENCOUNTER — Encounter
Admission: RE | Admit: 2015-09-11 | Discharge: 2015-09-11 | Disposition: A | Discharge status: Home / Self Care | Payer: BLUE CROSS/BLUE SHIELD | Source: Ambulatory Visit | Attending: Obstetrics & Gynecology | Admitting: Obstetrics & Gynecology

## 2015-09-11 DIAGNOSIS — Z800 Family history of malignant neoplasm of digestive organs: Secondary | ICD-10-CM | POA: Diagnosis not present

## 2015-09-11 DIAGNOSIS — N711 Chronic inflammatory disease of uterus: Secondary | ICD-10-CM | POA: Diagnosis not present

## 2015-09-11 DIAGNOSIS — Z79899 Other long term (current) drug therapy: Secondary | ICD-10-CM | POA: Diagnosis not present

## 2015-09-11 DIAGNOSIS — Z91041 Radiographic dye allergy status: Secondary | ICD-10-CM | POA: Diagnosis not present

## 2015-09-11 DIAGNOSIS — Z803 Family history of malignant neoplasm of breast: Secondary | ICD-10-CM | POA: Diagnosis not present

## 2015-09-11 DIAGNOSIS — N8301 Follicular cyst of right ovary: Secondary | ICD-10-CM | POA: Diagnosis not present

## 2015-09-11 DIAGNOSIS — Z30432 Encounter for removal of intrauterine contraceptive device: Secondary | ICD-10-CM | POA: Diagnosis not present

## 2015-09-11 DIAGNOSIS — Z9889 Other specified postprocedural states: Secondary | ICD-10-CM | POA: Diagnosis not present

## 2015-09-11 DIAGNOSIS — D250 Submucous leiomyoma of uterus: Secondary | ICD-10-CM | POA: Diagnosis not present

## 2015-09-11 DIAGNOSIS — D649 Anemia, unspecified: Secondary | ICD-10-CM | POA: Diagnosis not present

## 2015-09-11 DIAGNOSIS — Z6833 Body mass index (BMI) 33.0-33.9, adult: Secondary | ICD-10-CM | POA: Diagnosis not present

## 2015-09-11 DIAGNOSIS — N838 Other noninflammatory disorders of ovary, fallopian tube and broad ligament: Secondary | ICD-10-CM | POA: Diagnosis not present

## 2015-09-11 DIAGNOSIS — E669 Obesity, unspecified: Secondary | ICD-10-CM | POA: Diagnosis not present

## 2015-09-11 DIAGNOSIS — N72 Inflammatory disease of cervix uteri: Secondary | ICD-10-CM | POA: Diagnosis not present

## 2015-09-11 DIAGNOSIS — N843 Polyp of vulva: Secondary | ICD-10-CM | POA: Diagnosis not present

## 2015-09-11 DIAGNOSIS — N761 Subacute and chronic vaginitis: Secondary | ICD-10-CM | POA: Diagnosis not present

## 2015-09-11 DIAGNOSIS — Z302 Encounter for sterilization: Secondary | ICD-10-CM | POA: Diagnosis not present

## 2015-09-11 DIAGNOSIS — Z8249 Family history of ischemic heart disease and other diseases of the circulatory system: Secondary | ICD-10-CM | POA: Diagnosis not present

## 2015-09-11 DIAGNOSIS — N920 Excessive and frequent menstruation with regular cycle: Secondary | ICD-10-CM | POA: Diagnosis present

## 2015-09-11 LAB — CBC
HEMATOCRIT: 34.6 % — AB (ref 35.0–47.0)
Hemoglobin: 11.7 g/dL — ABNORMAL LOW (ref 12.0–16.0)
MCH: 28.2 pg (ref 26.0–34.0)
MCHC: 33.7 g/dL (ref 32.0–36.0)
MCV: 83.8 fL (ref 80.0–100.0)
PLATELETS: 257 10*3/uL (ref 150–440)
RBC: 4.13 MIL/uL (ref 3.80–5.20)
RDW: 13.9 % (ref 11.5–14.5)
WBC: 4.7 10*3/uL (ref 3.6–11.0)

## 2015-09-11 LAB — TYPE AND SCREEN
ABO/RH(D): O POS
Antibody Screen: NEGATIVE

## 2015-09-11 LAB — ABO/RH: ABO/RH(D): O POS

## 2015-09-14 ENCOUNTER — Ambulatory Visit
Admission: RE | Admit: 2015-09-14 | Discharge: 2015-09-14 | Disposition: A | Discharge status: Home / Self Care | Payer: BLUE CROSS/BLUE SHIELD | Source: Ambulatory Visit | Attending: Obstetrics & Gynecology | Admitting: Obstetrics & Gynecology

## 2015-09-14 ENCOUNTER — Encounter
Admission: RE | Disposition: A | Discharge status: Home / Self Care | Payer: Self-pay | Source: Ambulatory Visit | Attending: Obstetrics & Gynecology

## 2015-09-14 ENCOUNTER — Ambulatory Visit: Payer: BLUE CROSS/BLUE SHIELD | Admitting: Anesthesiology

## 2015-09-14 ENCOUNTER — Encounter: Payer: Self-pay | Admitting: *Deleted

## 2015-09-14 DIAGNOSIS — Z6833 Body mass index (BMI) 33.0-33.9, adult: Secondary | ICD-10-CM | POA: Insufficient documentation

## 2015-09-14 DIAGNOSIS — Z302 Encounter for sterilization: Secondary | ICD-10-CM | POA: Insufficient documentation

## 2015-09-14 DIAGNOSIS — N920 Excessive and frequent menstruation with regular cycle: Secondary | ICD-10-CM | POA: Insufficient documentation

## 2015-09-14 DIAGNOSIS — D259 Leiomyoma of uterus, unspecified: Secondary | ICD-10-CM | POA: Diagnosis present

## 2015-09-14 DIAGNOSIS — N979 Female infertility, unspecified: Secondary | ICD-10-CM | POA: Diagnosis present

## 2015-09-14 DIAGNOSIS — N9089 Other specified noninflammatory disorders of vulva and perineum: Secondary | ICD-10-CM

## 2015-09-14 DIAGNOSIS — Z9889 Other specified postprocedural states: Secondary | ICD-10-CM | POA: Insufficient documentation

## 2015-09-14 DIAGNOSIS — N83201 Unspecified ovarian cyst, right side: Secondary | ICD-10-CM | POA: Diagnosis present

## 2015-09-14 DIAGNOSIS — Z803 Family history of malignant neoplasm of breast: Secondary | ICD-10-CM | POA: Insufficient documentation

## 2015-09-14 DIAGNOSIS — N838 Other noninflammatory disorders of ovary, fallopian tube and broad ligament: Secondary | ICD-10-CM | POA: Insufficient documentation

## 2015-09-14 DIAGNOSIS — Z800 Family history of malignant neoplasm of digestive organs: Secondary | ICD-10-CM | POA: Insufficient documentation

## 2015-09-14 DIAGNOSIS — D250 Submucous leiomyoma of uterus: Secondary | ICD-10-CM | POA: Insufficient documentation

## 2015-09-14 DIAGNOSIS — N711 Chronic inflammatory disease of uterus: Secondary | ICD-10-CM | POA: Insufficient documentation

## 2015-09-14 DIAGNOSIS — D649 Anemia, unspecified: Secondary | ICD-10-CM | POA: Insufficient documentation

## 2015-09-14 DIAGNOSIS — Z30432 Encounter for removal of intrauterine contraceptive device: Secondary | ICD-10-CM | POA: Insufficient documentation

## 2015-09-14 DIAGNOSIS — N8301 Follicular cyst of right ovary: Secondary | ICD-10-CM | POA: Insufficient documentation

## 2015-09-14 DIAGNOSIS — Z8249 Family history of ischemic heart disease and other diseases of the circulatory system: Secondary | ICD-10-CM | POA: Insufficient documentation

## 2015-09-14 DIAGNOSIS — E669 Obesity, unspecified: Secondary | ICD-10-CM | POA: Insufficient documentation

## 2015-09-14 DIAGNOSIS — N72 Inflammatory disease of cervix uteri: Secondary | ICD-10-CM | POA: Insufficient documentation

## 2015-09-14 DIAGNOSIS — N761 Subacute and chronic vaginitis: Secondary | ICD-10-CM | POA: Diagnosis present

## 2015-09-14 DIAGNOSIS — Z91041 Radiographic dye allergy status: Secondary | ICD-10-CM | POA: Insufficient documentation

## 2015-09-14 DIAGNOSIS — N843 Polyp of vulva: Secondary | ICD-10-CM | POA: Insufficient documentation

## 2015-09-14 DIAGNOSIS — Z79899 Other long term (current) drug therapy: Secondary | ICD-10-CM | POA: Insufficient documentation

## 2015-09-14 HISTORY — PX: TUBAL LIGATION: SHX77

## 2015-09-14 HISTORY — PX: LAPAROSCOPIC BILATERAL SALPINGECTOMY: SHX5889

## 2015-09-14 HISTORY — PX: VULVAR LESION REMOVAL: SHX5391

## 2015-09-14 HISTORY — PX: DILITATION & CURRETTAGE/HYSTROSCOPY WITH NOVASURE ABLATION: SHX5568

## 2015-09-14 HISTORY — DX: Cardiac arrhythmia, unspecified: I49.9

## 2015-09-14 HISTORY — PX: IUD REMOVAL: SHX5392

## 2015-09-14 LAB — POCT PREGNANCY, URINE: PREG TEST UR: NEGATIVE

## 2015-09-14 SURGERY — DILATATION & CURETTAGE/HYSTEROSCOPY WITH NOVASURE ABLATION
Anesthesia: General | Wound class: Clean Contaminated

## 2015-09-14 MED ORDER — KETOROLAC TROMETHAMINE 30 MG/ML IJ SOLN
INTRAMUSCULAR | Status: DC | PRN
  Administered 2015-09-14: 30 mg via INTRAVENOUS

## 2015-09-14 MED ORDER — OXYCODONE-ACETAMINOPHEN 5-325 MG PO TABS: 1.0000 | ORAL_TABLET | ORAL | Status: DC | PRN

## 2015-09-14 MED ORDER — LABETALOL HCL 5 MG/ML IV SOLN
INTRAVENOUS | Status: DC | PRN
  Administered 2015-09-14: 10 mg via INTRAVENOUS

## 2015-09-14 MED ORDER — ONDANSETRON HCL 4 MG/2ML IJ SOLN
4.0000 mg | Freq: Once | INTRAMUSCULAR | Status: AC | PRN
  Administered 2015-09-14: 4 mg via INTRAVENOUS

## 2015-09-14 MED ORDER — LACTATED RINGERS IV SOLN
INTRAVENOUS | Status: DC
  Administered 2015-09-14: 09:00:00 via INTRAVENOUS

## 2015-09-14 MED ORDER — PROPOFOL 10 MG/ML IV BOLUS
INTRAVENOUS | Status: DC | PRN
  Administered 2015-09-14: 10 mg via INTRAVENOUS

## 2015-09-14 MED ORDER — FENTANYL CITRATE (PF) 100 MCG/2ML IJ SOLN
25.0000 ug | INTRAMUSCULAR | Status: DC | PRN
  Administered 2015-09-14 (×4): 25 ug via INTRAVENOUS

## 2015-09-14 MED ORDER — MIDAZOLAM HCL 2 MG/2ML IJ SOLN
INTRAMUSCULAR | Status: DC | PRN
  Administered 2015-09-14: 2 mg via INTRAVENOUS

## 2015-09-14 MED ORDER — GLYCOPYRROLATE 0.2 MG/ML IJ SOLN
INTRAMUSCULAR | Status: DC | PRN
  Administered 2015-09-14: 0.4 mg via INTRAVENOUS

## 2015-09-14 MED ORDER — SILVER NITRATE-POT NITRATE 75-25 % EX MISC
CUTANEOUS | Status: AC
  Filled 2015-09-14: qty 2

## 2015-09-14 MED ORDER — LIDOCAINE HCL (CARDIAC) 20 MG/ML IV SOLN
INTRAVENOUS | Status: DC | PRN
  Administered 2015-09-14 (×2): 100 mg via INTRAVENOUS

## 2015-09-14 MED ORDER — FENTANYL CITRATE (PF) 100 MCG/2ML IJ SOLN
INTRAMUSCULAR | Status: DC | PRN
  Administered 2015-09-14 (×2): 50 ug via INTRAVENOUS
  Administered 2015-09-14: 100 ug via INTRAVENOUS
  Administered 2015-09-14: 50 ug via INTRAVENOUS

## 2015-09-14 MED ORDER — NEOSTIGMINE METHYLSULFATE 10 MG/10ML IV SOLN
INTRAVENOUS | Status: DC | PRN
  Administered 2015-09-14: 3 mg via INTRAVENOUS

## 2015-09-14 MED ORDER — FERRIC SUBSULFATE 259 MG/GM EX SOLN
CUTANEOUS | Status: AC
  Filled 2015-09-14: qty 8

## 2015-09-14 MED ORDER — FENTANYL CITRATE (PF) 100 MCG/2ML IJ SOLN
INTRAMUSCULAR | Status: AC
  Administered 2015-09-14: 25 ug via INTRAVENOUS
  Filled 2015-09-14: qty 2

## 2015-09-14 MED ORDER — DEXAMETHASONE SODIUM PHOSPHATE 4 MG/ML IJ SOLN
INTRAMUSCULAR | Status: DC | PRN
  Administered 2015-09-14: 5 mg via INTRAVENOUS

## 2015-09-14 MED ORDER — FAMOTIDINE 20 MG PO TABS
ORAL_TABLET | ORAL | Status: AC
  Filled 2015-09-14: qty 1

## 2015-09-14 MED ORDER — FAMOTIDINE 20 MG PO TABS
20.0000 mg | ORAL_TABLET | Freq: Once | ORAL | Status: AC
  Administered 2015-09-14: 20 mg via ORAL

## 2015-09-14 MED ORDER — ONDANSETRON HCL 4 MG/2ML IJ SOLN
INTRAMUSCULAR | Status: AC
  Filled 2015-09-14: qty 2

## 2015-09-14 MED ORDER — BUPIVACAINE HCL (PF) 0.5 % IJ SOLN
INTRAMUSCULAR | Status: AC
  Filled 2015-09-14: qty 30

## 2015-09-14 MED ORDER — ROCURONIUM BROMIDE 100 MG/10ML IV SOLN
INTRAVENOUS | Status: DC | PRN
  Administered 2015-09-14: 35 mg via INTRAVENOUS
  Administered 2015-09-14: 10 mg via INTRAVENOUS

## 2015-09-14 MED ORDER — BUPIVACAINE HCL (PF) 0.5 % IJ SOLN
INTRAMUSCULAR | Status: DC | PRN
  Administered 2015-09-14: 10 mL

## 2015-09-14 MED ORDER — ONDANSETRON HCL 4 MG/2ML IJ SOLN
INTRAMUSCULAR | Status: DC | PRN
  Administered 2015-09-14: 4 mg via INTRAVENOUS

## 2015-09-14 SURGICAL SUPPLY — 46 items
BLADE SURG SZ11 CARB STEEL (BLADE) ×3 IMPLANT
CANISTER SUCT 1200ML W/VALVE (MISCELLANEOUS) ×3 IMPLANT
CANISTER SUCT 3000ML (MISCELLANEOUS) ×3 IMPLANT
CATH ROBINSON RED A/P 16FR (CATHETERS) ×3 IMPLANT
CHLORAPREP W/TINT 26ML (MISCELLANEOUS) ×3 IMPLANT
DRESSING TELFA 4X3 1S ST N-ADH (GAUZE/BANDAGES/DRESSINGS) IMPLANT
DRSG TEGADERM 2X2.25 PEDS (GAUZE/BANDAGES/DRESSINGS) ×1 IMPLANT
ENDOPOUCH RETRIEVER 10 (MISCELLANEOUS) IMPLANT
GAUZE SPONGE NON-WVN 2X2 STRL (MISCELLANEOUS) IMPLANT
GLOVE BIO SURGEON STRL SZ7.5 (GLOVE) ×8 IMPLANT
GLOVE BIO SURGEON STRL SZ8 (GLOVE) ×3 IMPLANT
GLOVE INDICATOR 8.0 STRL GRN (GLOVE) ×6 IMPLANT
GOWN STRL REUS W/ TWL LRG LVL3 (GOWN DISPOSABLE) ×2 IMPLANT
GOWN STRL REUS W/ TWL XL LVL3 (GOWN DISPOSABLE) ×2 IMPLANT
GOWN STRL REUS W/TWL LRG LVL3 (GOWN DISPOSABLE) ×3
GOWN STRL REUS W/TWL XL LVL3 (GOWN DISPOSABLE) ×3
IRRIGATION STRYKERFLOW (MISCELLANEOUS) IMPLANT
IRRIGATOR STRYKERFLOW (MISCELLANEOUS)
IV LACTATED RINGERS 1000ML (IV SOLUTION) ×3 IMPLANT
KIT PINK PAD W/HEAD ARE REST (MISCELLANEOUS) ×3
KIT PINK PAD W/HEAD ARM REST (MISCELLANEOUS) ×2 IMPLANT
LABEL OR SOLS (LABEL) ×3 IMPLANT
LIQUID BAND (GAUZE/BANDAGES/DRESSINGS) ×3 IMPLANT
NEEDLE VERESS 14GA 120MM (NEEDLE) ×3 IMPLANT
NOVASURE ENDOMETRIAL ABLATION (MISCELLANEOUS) ×3 IMPLANT
NS IRRIG 500ML POUR BTL (IV SOLUTION) ×3 IMPLANT
PACK DNC HYST (MISCELLANEOUS) ×3 IMPLANT
PACK GYN LAPAROSCOPIC (MISCELLANEOUS) ×3 IMPLANT
PAD OB MATERNITY 4.3X12.25 (PERSONAL CARE ITEMS) ×3 IMPLANT
PAD PREP 24X41 OB/GYN DISP (PERSONAL CARE ITEMS) ×3 IMPLANT
PAD TELFA 2X3 NADH STRL (GAUZE/BANDAGES/DRESSINGS) ×1 IMPLANT
SCISSORS METZENBAUM CVD 33 (INSTRUMENTS) ×3 IMPLANT
SHEARS HARMONIC ACE PLUS 36CM (ENDOMECHANICALS) ×1 IMPLANT
SLEEVE ENDOPATH XCEL 5M (ENDOMECHANICALS) IMPLANT
SPONGE VERSALON 2X2 STRL (MISCELLANEOUS)
STRAP SAFETY BODY (MISCELLANEOUS) ×3 IMPLANT
SUT VIC AB 2-0 UR6 27 (SUTURE) IMPLANT
SUT VIC AB 3-0 SH 27 (SUTURE) ×3
SUT VIC AB 3-0 SH 27X BRD (SUTURE) IMPLANT
SUT VIC AB 4-0 PS2 18 (SUTURE) IMPLANT
SYRINGE 10CC LL (SYRINGE) ×3 IMPLANT
TOWEL OR 17X26 4PK STRL BLUE (TOWEL DISPOSABLE) ×3 IMPLANT
TROCAR ENDO BLADELESS 11MM (ENDOMECHANICALS) IMPLANT
TROCAR XCEL NON-BLD 5MMX100MML (ENDOMECHANICALS) ×3 IMPLANT
TUBING CONNECTING 10 (TUBING) ×3 IMPLANT
TUBING INSUFFLATOR HI FLOW (MISCELLANEOUS) ×3 IMPLANT

## 2015-09-14 NOTE — Op Note (Signed)
Operative Note   09/14/2015  PRE-OP DIAGNOSIS: Menorrhagia, Desire for sterility, Submucosal and Subserosal Fibroids, Right Ovarian Cyst, Chronic Vaginitis, Vulvar lesion   POST-OP DIAGNOSIS: same   SURGEON: Barnett Applebaum, MD, FACOG   PROCEDURE: Procedure(s):  OPERATIVE LAPAROSCOPY DILATATION & CURETTAGE/HYSTEROSCOPY WITH NOVASURE ABLATION AND RESECTION OF FIBROID BILATERAL TUBAL LIGATION by LAPAROSCOPIC BILATERAL SALPINGECTOMY RIGHT OVARIAN CYSTECTOMY VULVAR LESION BIOPSY INTRAUTERINE DEVICE (IUD) REMOVAL  ANESTHESIA: General   ESTIMATED BLOOD LOSS: min (<35mL)  SPECIMENS: Right tube, Left tube, Right Ovarian Cyst Wall, EMC, Submucosal Fibroid, Vulvar Biopsy  FLUID DEFICIT: min   COMPLICATIONS: none   DISPOSITION: PACU - hemodynamically stable.   CONDITION: stable   FINDINGS: Exam under anesthesia revealed small, mobile small uterus with no masses and bilateral adnexa without masses or fullness. Hysteroscopy revealed 2cm SM Fibroid, otherwise grossly normal appearing uterine cavity with bilateral tubal ostia and normal appearing endocervical canal. Laproscopy revealed slightly enl;areged uterus with subserosal fibroids seen.  Right ovarian simple cyst.  Normal tubes.  PROCEDURE IN DETAIL: After informed consent was obtained, the patient was taken to the operating room where anesthesia was obtained without difficulty. The patient was positioned in the dorsal lithotomy position in [place]. The patient's bladder was catheterized with an in and out foley catheter. The patient was examined under anesthesia, with the above noted findings. The weightedspeculum was placed inside the patient's vagina, and the the anterior lip of the cervix was seen and grasped with the tenaculum.  The IUD strings are seen and IUD removed.   The uterine cavity was sounded to 8cm and a Hulka Tenaculum was placed.  Attention is then turned to the abdomen where a veress needle is placed through an  infraumbilical incision after Marcaine is used to anesthesize the skin.  Veress needle placement is confirmed using the hanging drop technique and the abdomen is then insufflated w CO2 gas.  A 11 mm trocar is then placed under direct visualization with the laparoscope and the above mentioned findings seen.  An addidtional 74mm suprapubic trocar is placed.  The left fallopian tube is grasped and dissected free using the Harmonic scapel along the mesosalpinx with preservation of the main ovarian arterial blood flow to the ovary.  The same is performed on the right.  The right ovarian cyst is aspirated and the cyst wall extracted.  Hemostasis is assured.  Gas is expelled and trocars removed with skin closure using Dermabond.  Return to vaginal approach.  The cervix was progressively dilated to a 18 French-Pratt dilator. The 30 degree hysteroscope was introduced, with LR fluid used to distend the intrauterine cavity, with the above noted findings. Hysteroscope removed. The fibroid is grasped with a forceps and removed easily, and reinspection of this site reveals no bleeding.  The hystersocope was removed and the uterine cavity was curetted until a gritty texture was noted, yielding endometrial curettings. Repeat hysteroscopy performed, with improved contour and lining of uterus noted.  Excellent hemostasis was noted.   The NovaSure device was then placed without difficulty. Measurements were obtained. Patient was noted to have a uterine length of 8 cm, a cervical length of 2.5 cm, and a cervical width of 3.8 cm. The NovaSure device is first tested and after confirmation the procedures performed. Length of procedure was 66 seconds. The NovaSure device is then removed and repeat hysteroscopy reveals an appropriate lining of the uterus and no perforation or injury. Hysteroscope is removed with minimal discrepancy of fluid.  Power was 115.   Tenaculum was  removed with excellent hemostasis noted. She was then taken  out of dorsal lithotomy. Minimal discrepancy in fluid was noted. No bleeding.  Vulvar biopsy performed in left labia due to chronic irritation and perceptive lesion there, although visualization revealed pigmentation changes but no significant lesion.   The patient tolerated the procedure well. Sponge, lap and needle counts were correct x2. The patient was taken to recovery room in excellent condition.

## 2015-09-14 NOTE — Discharge Instructions (Signed)
General Gynecological Post-Operative Instructions °You may expect to feel dizzy, weak, and drowsy for as long as 24 hours after receiving the medicine that made you sleep (anesthetic).  °Do not drive a car, ride a bicycle, participate in physical activities, or take public transportation until you are done taking narcotic pain medicines or as directed by your doctor.  °Do not drink alcohol or take tranquilizers.  °Do not take medicine that has not been prescribed by your doctor.  °Do not sign important papers or make important decisions while on narcotic pain medicines.  °Have a responsible person with you.  °CARE OF INCISION  °Keep incision clean and dry. °Take showers instead of baths until your doctor gives you permission to take baths.  °Avoid heavy lifting (more than 10 pounds/4.5 kilograms), pushing, or pulling.  °Avoid activities that may risk injury to your surgical site.  °No sexual intercourse or placement of anything in the vagina for 2 weeks or as instructed by your doctor. °If you have tubes coming from the wound site, check with your doctor regarding appropriate care of the tubes. °Only take prescription or over-the-counter medicines  for pain, discomfort, or fever as directed by your doctor. Do not take aspirin. It can make you bleed. Take medicines (antibiotics) that kill germs if they are prescribed for you.  °Call the office or go to the ER if:  °You feel sick to your stomach (nauseous) and you start to throw up (vomit).  °You have trouble eating or drinking.  °You have an oral temperature above 101.  °You have constipation that is not helped by adjusting diet or increasing fluid intake. Pain medicines are a common cause of constipation.  °You have any other concerns. °SEEK IMMEDIATE MEDICAL CARE IF:  °You have persistent dizziness.  °You have difficulty breathing or a congested sounding (croupy) cough.  °You have an oral temperature above 102.5, not controlled by medicine.  °There is increasing  pain or tenderness near or in the surgical site.  ° °AMBULATORY SURGERY  °DISCHARGE INSTRUCTIONS ° ° °1) The drugs that you were given will stay in your system until tomorrow so for the next 24 hours you should not: ° °A) Drive an automobile °B) Make any legal decisions °C) Drink any alcoholic beverage ° ° °2) You may resume regular meals tomorrow.  Today it is better to start with liquids and gradually work up to solid foods. ° °You may eat anything you prefer, but it is better to start with liquids, then soup and crackers, and gradually work up to solid foods. ° ° °3) Please notify your doctor immediately if you have any unusual bleeding, trouble breathing, redness and pain at the surgery site, drainage, fever, or pain not relieved by medication. ° ° ° °4) Additional Instructions: ° ° ° ° ° ° ° °Please contact your physician with any problems or Same Day Surgery at [phone], Monday through Friday 6 am to 4 pm, or [place] at  Main number at [phone]. ° ° °

## 2015-09-14 NOTE — OR Nursing (Signed)
Patient c/o nausea. Iv Zofran given

## 2015-09-14 NOTE — H&P (Signed)
History and Physical Interval Note:  09/14/2015 8:36 AM  Kristy [name]  has presented today for surgery, with the diagnosis of desire for permanent sterility,menorrhagia,chronic discharge,vulvar lesion  The various methods of treatment have been discussed with the patient and family. After consideration of risks, benefits and other options for treatment, the patient has consented to  Procedure(s): DILATATION & CURETTAGE/HYSTEROSCOPY WITH NOVASURE ABLATION (N/A) BILATERAL TUBAL LIGATION (Bilateral) by LAPAROSCOPIC BILATERAL SALPINGECTOMY (Bilateral) VULVAR LESION EXCISION INTRAUTERINE DEVICE (IUD) REMOVAL as a surgical intervention .  The patient's history has been reviewed, patient examined, no change in status, stable for surgery.  Pt has the following beta blocker history-  Not taking Beta Blocker.  I have reviewed the patient's chart and labs.  Questions were answered to the patient's satisfaction.    Hoyt Koch

## 2015-09-14 NOTE — Anesthesia Preprocedure Evaluation (Addendum)
Anesthesia Evaluation  Patient identified by MRN, date of birth, ID band Patient awake    Reviewed: Allergy & Precautions, NPO status , Patient's Chart, lab work & pertinent test results, reviewed documented beta blocker date and time   Airway Mallampati: III  TM Distance: >3 FB     Dental  (+) Chipped   Pulmonary           Cardiovascular + dysrhythmias      Neuro/Psych    GI/Hepatic   Endo/Other    Renal/GU      Musculoskeletal   Abdominal   Peds  Hematology   Anesthesia Other Findings Obese. Hx of arrythmias. Anemic Hb 11.7.  Reproductive/Obstetrics                            Anesthesia Physical Anesthesia Plan  ASA: II  Anesthesia Plan: General   Post-op Pain Management:    Induction: Intravenous  Airway Management Planned: Oral ETT  Additional Equipment:   Intra-op Plan:   Post-operative Plan:   Informed Consent: I have reviewed the patients History and Physical, chart, labs and discussed the procedure including the risks, benefits and alternatives for the proposed anesthesia with the patient or authorized representative who has indicated his/her understanding and acceptance.     Plan Discussed with: CRNA  Anesthesia Plan Comments:         Anesthesia Quick Evaluation

## 2015-09-14 NOTE — Anesthesia Procedure Notes (Signed)
Procedure Name: Intubation Performed by: Bernardo Heater Pre-anesthesia Checklist: Patient identified, Emergency Drugs available, Suction available and Patient being monitored Patient Re-evaluated:Patient Re-evaluated prior to inductionOxygen Delivery Method: Circle system utilized Preoxygenation: Pre-oxygenation with 100% oxygen Intubation Type: IV induction Laryngoscope Size: Mac and 3 Grade View: Grade I Tube type: Oral Tube size: 7.0 mm Number of attempts: 1 Secured at: 21 cm Tube secured with: Tape Dental Injury: Teeth and Oropharynx as per pre-operative assessment

## 2015-09-14 NOTE — Anesthesia Postprocedure Evaluation (Signed)
Anesthesia Post Note  Patient: Scientific laboratory technician  Procedure(s) Performed: Procedure(s) (LRB): DILATATION & CURETTAGE/HYSTEROSCOPY WITH NOVASURE ABLATION (N/A) BILATERAL TUBAL LIGATION (Bilateral) LAPAROSCOPIC BILATERAL SALPINGECTOMY (Bilateral) VULVAR LESION (N/A) INTRAUTERINE DEVICE (IUD) REMOVAL (N/A)  Patient location during evaluation: PACU Anesthesia Type: General Level of consciousness: awake and alert Pain management: pain level controlled Vital Signs Assessment: post-procedure vital signs reviewed and stable Respiratory status: spontaneous breathing, nonlabored ventilation, respiratory function stable and patient connected to nasal cannula oxygen Cardiovascular status: blood pressure returned to baseline and stable Postop Assessment: no signs of nausea or vomiting Anesthetic complications: no    Last Vitals:  Filed Vitals:   09/14/15 1243 09/14/15 1311  BP: 117/80 125/89  Pulse: 56 74  Temp:    Resp: 12 12    Last Pain:  Filed Vitals:   09/14/15 1312  PainSc: 0-No pain                 Dantrell Schertzer S

## 2015-09-14 NOTE — Transfer of Care (Signed)
Immediate Anesthesia Transfer of Care Note  Patient: Kristy [name]  Procedure(s) Performed: Procedure(s): DILATATION & CURETTAGE/HYSTEROSCOPY WITH NOVASURE ABLATION (N/A) BILATERAL TUBAL LIGATION (Bilateral) LAPAROSCOPIC BILATERAL SALPINGECTOMY (Bilateral) VULVAR LESION (N/A) INTRAUTERINE DEVICE (IUD) REMOVAL (N/A)  Patient Location: PACU  Anesthesia Type:General  Level of Consciousness: sedated  Airway & Oxygen Therapy: Patient Spontanous Breathing and Patient connected to nasal cannula oxygen  Post-op Assessment: Report given to RN and Post -op Vital signs reviewed and stable  Post vital signs: Reviewed and stable  Last Vitals:  Filed Vitals:   09/14/15 0821 09/14/15 1045  BP: 121/85 122/88  Pulse: 78 67  Temp: 36.8 C 36.3 C  Resp: 16 19    Complications: No apparent anesthesia complications

## 2015-09-15 LAB — SURGICAL PATHOLOGY

## 2015-12-13 ENCOUNTER — Ambulatory Visit (INDEPENDENT_AMBULATORY_CARE_PROVIDER_SITE_OTHER): Payer: BLUE CROSS/BLUE SHIELD | Admitting: Medical

## 2015-12-13 ENCOUNTER — Encounter: Payer: Self-pay | Admitting: Medical

## 2015-12-13 VITALS — BP 116/80 | HR 62 | Temp 98.0°F | Resp 18 | Wt 206.0 lb

## 2015-12-13 DIAGNOSIS — R51 Headache: Secondary | ICD-10-CM

## 2015-12-13 DIAGNOSIS — R002 Palpitations: Secondary | ICD-10-CM | POA: Diagnosis not present

## 2015-12-13 DIAGNOSIS — R0600 Dyspnea, unspecified: Secondary | ICD-10-CM

## 2015-12-13 DIAGNOSIS — R5383 Other fatigue: Secondary | ICD-10-CM

## 2015-12-13 DIAGNOSIS — E559 Vitamin D deficiency, unspecified: Secondary | ICD-10-CM | POA: Diagnosis not present

## 2015-12-13 DIAGNOSIS — J3489 Other specified disorders of nose and nasal sinuses: Secondary | ICD-10-CM

## 2015-12-13 DIAGNOSIS — R519 Headache, unspecified: Secondary | ICD-10-CM

## 2015-12-13 DIAGNOSIS — D509 Iron deficiency anemia, unspecified: Secondary | ICD-10-CM | POA: Diagnosis not present

## 2015-12-13 LAB — BASIC METABOLIC PANEL
BUN: 10 mg/dL (ref 7–25)
CALCIUM: 8.5 mg/dL — AB (ref 8.6–10.2)
CO2: 27 mmol/L (ref 20–31)
CREATININE: 0.8 mg/dL (ref 0.50–1.10)
Chloride: 103 mmol/L (ref 98–110)
Glucose, Bld: 100 mg/dL — ABNORMAL HIGH (ref 65–99)
Potassium: 3.9 mmol/L (ref 3.5–5.3)
SODIUM: 138 mmol/L (ref 135–146)

## 2015-12-13 LAB — CBC WITH DIFFERENTIAL/PLATELET
Basophils Absolute: 0 cells/uL (ref 0–200)
Basophils Relative: 0 %
EOS ABS: 315 {cells}/uL (ref 15–500)
EOS PCT: 7 %
HCT: 34.3 % — ABNORMAL LOW (ref 35.0–45.0)
Hemoglobin: 11.2 g/dL — ABNORMAL LOW (ref 11.7–15.5)
Lymphocytes Relative: 42 %
Lymphs Abs: 1890 cells/uL (ref 850–3900)
MCH: 27.5 pg (ref 27.0–33.0)
MCHC: 32.7 g/dL (ref 32.0–36.0)
MCV: 84.1 fL (ref 80.0–100.0)
MONOS PCT: 5 %
MPV: 9.7 fL (ref 7.5–12.5)
Monocytes Absolute: 225 cells/uL (ref 200–950)
NEUTROS ABS: 2070 {cells}/uL (ref 1500–7800)
Neutrophils Relative %: 46 %
PLATELETS: 248 10*3/uL (ref 140–400)
RBC: 4.08 MIL/uL (ref 3.80–5.10)
RDW: 15.1 % — AB (ref 11.0–15.0)
WBC: 4.5 10*3/uL (ref 4.0–10.5)

## 2015-12-13 LAB — TSH: TSH: 1.14 m[IU]/L

## 2015-12-13 MED ORDER — AMOXICILLIN 875 MG PO TABS: 875.0000 mg | ORAL_TABLET | Freq: Two times a day (BID) | ORAL | Status: DC

## 2015-12-13 NOTE — Progress Notes (Signed)
Subjective: Chief Complaint  Patient presents with  . sinus infection?    having trouble catching breath. feeling faint. no cough. no sore throat. headaches. drainage. back pain. congestion.    Here for headaches.  She notes headaches daily for 8 days.  Here for not feeling well, headache, palpitations, thought it was from too much caffeine. So she cut back on caffeine.  Saw some improvements, but now feels like something is stuck in chest.  Feels sinus pressure over the past week.   No sore throat, no cough. Had some ear ache this past week.   Has congestion in head and chest.    Using sinus medication Mucinex for symptoms.  Took an aspirin the other day for chest tightness.  Feels SOB.  Has some colored mucous from nose.  Denies witness apnea, snoring or concern for sleep apnea.  No self or family hx/o thyroid disease.  No other aggravating or relieving factors. No other complaint.  Past Medical History  Diagnosis Date  . H. pylori infection 10/2010  . Iron deficiency anemia, unspecified   . Unspecified vitamin D deficiency   . Seasonal allergies     ragweed, tree pollen, mild and dust per testing 08/2012  . Dysrhythmia     PT STATES IRREGULAR HEART BEAT YEARS AGO-HOLTER MONITOR NORMAL-PT HAS HAD NO PROBLEMS SINCE   ROS as in subjective   Objective: BP 116/80 mmHg  Pulse 62  Temp(Src) 98 F (36.7 C) (Tympanic)  Resp 18  Wt 206 lb (93.441 kg)  SpO2 98%  General appearance: alert, no distress, WD/WN, fatigue appearing HEENT: normocephalic, sclerae anicteric, PERRLA, EOMi, nares with mildly swollen turbinated, clear discharge mild with mild erythema, pharynx normal Oral cavity: MMM, no lesions Neck: supple, no lymphadenopathy, no thyromegaly, no masses, no bruits Heart: few ectopic beats, RRR, normal S1, S2, no murmurs Lungs: CTA bilaterally, no wheezes, rhonchi, or rales Extremities: no edema, no cyanosis, no clubbing Pulses: 2+ symmetric, upper and lower extremities, normal cap  refill Neurological: alert, oriented x 3, CN2-12 intact, strength normal upper extremities and lower extremities, sensation normal throughout, DTRs 2+ throughout, no cerebellar signs, gait normal Psychiatric: normal affect, behavior normal, pleasant    Adult ECG Report  Indication: palpitations  Rate: 67bpm  Rhythm: normal sinus rhythm and premature ventricular contractions (PVC)  QRS Axis: 62 degrees  PR Interval: 169ms  QRS Duration: 25ms  QTc: 413ms  Conduction Disturbances: none  Other Abnormalities: none  Patient's cardiac risk factors are: none.  EKG comparison: none  Narrative Interpretation: PVC, otherwise normal     Assessment: Encounter Diagnoses  Name Primary?  . Acute nonintractable headache, unspecified headache type Yes  . Palpitation   . Dyspnea   . Sinus pressure   . Other fatigue      Plan: Etiology unclear.  She has PVCs on EKG.  She thinks she has sinus infection but worried about headaches and palpitations.   Labs today.   Will use round of amoxicillin for possible sinusitis.  Discussed limiting caffeine, if headache c/t or worsen in the next few days, we can get scan to rule out worrisome issues.   For now use Ibuprofen or tylenol OTC for headache.  She is not compliant with Vit D and iron started few moths ago for low vit D and iron deficiency anemia.  F/u pending labs.

## 2015-12-15 ENCOUNTER — Other Ambulatory Visit: Payer: Self-pay | Admitting: Medical

## 2015-12-15 ENCOUNTER — Telehealth: Payer: Self-pay

## 2015-12-15 MED ORDER — FLUCONAZOLE 150 MG PO TABS: ORAL_TABLET | ORAL | Status: DC

## 2015-12-15 NOTE — Telephone Encounter (Signed)
Pt called stating she is getting a yeast infection from the antibiotic and would like diflucan sent in

## 2015-12-15 NOTE — Telephone Encounter (Signed)
Med sent.

## 2015-12-15 NOTE — Telephone Encounter (Signed)
Pt is aware.  

## 2015-12-19 DIAGNOSIS — R002 Palpitations: Secondary | ICD-10-CM | POA: Diagnosis not present

## 2015-12-19 DIAGNOSIS — Z91041 Radiographic dye allergy status: Secondary | ICD-10-CM | POA: Diagnosis not present

## 2015-12-19 DIAGNOSIS — R0602 Shortness of breath: Secondary | ICD-10-CM | POA: Diagnosis not present

## 2015-12-19 DIAGNOSIS — I498 Other specified cardiac arrhythmias: Secondary | ICD-10-CM | POA: Diagnosis not present

## 2015-12-19 DIAGNOSIS — R079 Chest pain, unspecified: Secondary | ICD-10-CM | POA: Diagnosis not present

## 2016-01-22 DIAGNOSIS — N898 Other specified noninflammatory disorders of vagina: Secondary | ICD-10-CM | POA: Diagnosis not present

## 2016-01-22 DIAGNOSIS — N760 Acute vaginitis: Secondary | ICD-10-CM | POA: Diagnosis not present

## 2016-01-22 DIAGNOSIS — R350 Frequency of micturition: Secondary | ICD-10-CM | POA: Diagnosis not present

## 2016-01-22 DIAGNOSIS — B9689 Other specified bacterial agents as the cause of diseases classified elsewhere: Secondary | ICD-10-CM | POA: Diagnosis not present

## 2016-01-22 DIAGNOSIS — N390 Urinary tract infection, site not specified: Secondary | ICD-10-CM | POA: Diagnosis not present

## 2016-01-31 ENCOUNTER — Other Ambulatory Visit: Payer: Self-pay | Admitting: Medical

## 2016-01-31 NOTE — Telephone Encounter (Signed)
Is this ok to refill?  

## 2016-02-09 DIAGNOSIS — L816 Other disorders of diminished melanin formation: Secondary | ICD-10-CM | POA: Diagnosis not present

## 2016-02-09 DIAGNOSIS — L700 Acne vulgaris: Secondary | ICD-10-CM | POA: Diagnosis not present

## 2016-02-09 DIAGNOSIS — L219 Seborrheic dermatitis, unspecified: Secondary | ICD-10-CM | POA: Diagnosis not present

## 2016-03-15 ENCOUNTER — Other Ambulatory Visit: Payer: BLUE CROSS/BLUE SHIELD

## 2016-03-26 DIAGNOSIS — J329 Chronic sinusitis, unspecified: Secondary | ICD-10-CM | POA: Diagnosis not present

## 2016-03-26 DIAGNOSIS — Z91041 Radiographic dye allergy status: Secondary | ICD-10-CM | POA: Diagnosis not present

## 2016-03-26 DIAGNOSIS — R05 Cough: Secondary | ICD-10-CM | POA: Diagnosis not present

## 2016-03-26 DIAGNOSIS — J029 Acute pharyngitis, unspecified: Secondary | ICD-10-CM | POA: Diagnosis not present

## 2016-03-26 DIAGNOSIS — R0602 Shortness of breath: Secondary | ICD-10-CM | POA: Diagnosis not present

## 2016-03-26 DIAGNOSIS — J3489 Other specified disorders of nose and nasal sinuses: Secondary | ICD-10-CM | POA: Diagnosis not present

## 2016-04-02 ENCOUNTER — Ambulatory Visit: Payer: BLUE CROSS/BLUE SHIELD | Admitting: Family Medicine

## 2016-04-02 ENCOUNTER — Telehealth: Payer: Self-pay | Admitting: Family Medicine

## 2016-04-02 ENCOUNTER — Other Ambulatory Visit (INDEPENDENT_AMBULATORY_CARE_PROVIDER_SITE_OTHER): Payer: BLUE CROSS/BLUE SHIELD

## 2016-04-02 DIAGNOSIS — Z111 Encounter for screening for respiratory tuberculosis: Secondary | ICD-10-CM | POA: Diagnosis not present

## 2016-04-02 NOTE — Telephone Encounter (Signed)
Pt showed up late for her appt.  She was upset that she could not be seen.  She said she had been trying to get in here for 2 weeks and I tried to get her to explain that.  She said her schedule is tight and she works 2 jobs.  She called here last Tuesday close to 5:00 and we could not see her then.  So she went to ER and spent 300.00 and they didn't give her an antibiotic.  Dr. Richarda Overlie [facility].  So I called [facility] to see if they would call her in an antibiotic and they said no.  That their docs see patients one time.  Patient said she hasn't been sleeping, she is coughing all the time, trying different types of otc and nothing is working.  She also said she needed a TB skin test.  So I checked with [name] and got the ok for the TB test.  Explained to her if she would call me when she see's her next opportunity to come in, that I would schedule her an appt.  I offered her an appt for tomorrow and she said No.

## 2016-04-04 LAB — TB SKIN TEST
INDURATION: 0 mm
TB SKIN TEST: NEGATIVE

## 2016-05-20 ENCOUNTER — Telehealth: Payer: Self-pay | Admitting: Family Medicine

## 2016-05-20 ENCOUNTER — Other Ambulatory Visit: Payer: Self-pay | Admitting: Medical

## 2016-05-20 MED ORDER — VITAMIN D (ERGOCALCIFEROL) 1.25 MG (50000 UNIT) PO CAPS: ORAL_CAPSULE | ORAL | 0 refills | Status: DC

## 2016-05-20 NOTE — Telephone Encounter (Signed)
[facility] req [facility] VIT D2 1.25 mg

## 2016-05-20 NOTE — Telephone Encounter (Signed)
[facility] states they do not carry Ergocalciferol D2 but can dispense Cholecalciferol D3?  Is that [facility]?

## 2016-05-20 NOTE — Telephone Encounter (Signed)
Verify what her normal pharmacy is?  She should be able to get the weekly prescription D at a [facility].

## 2016-05-21 NOTE — Telephone Encounter (Signed)
Called and l/m for patient call back.

## 2016-05-22 NOTE — Telephone Encounter (Signed)
Called she has already pick up this meds.

## 2016-05-23 ENCOUNTER — Ambulatory Visit (INDEPENDENT_AMBULATORY_CARE_PROVIDER_SITE_OTHER): Payer: BLUE CROSS/BLUE SHIELD | Admitting: Family Medicine

## 2016-05-23 ENCOUNTER — Encounter: Payer: Self-pay | Admitting: Family Medicine

## 2016-05-23 ENCOUNTER — Ambulatory Visit
Admission: RE | Admit: 2016-05-23 | Discharge: 2016-05-23 | Disposition: A | Discharge status: Home / Self Care | Payer: BLUE CROSS/BLUE SHIELD | Source: Ambulatory Visit | Attending: Family Medicine | Admitting: Family Medicine

## 2016-05-23 ENCOUNTER — Other Ambulatory Visit: Payer: Self-pay

## 2016-05-23 VITALS — BP 130/88 | HR 80 | Temp 99.0°F | Ht 65.0 in | Wt 215.0 lb

## 2016-05-23 DIAGNOSIS — R05 Cough: Secondary | ICD-10-CM

## 2016-05-23 DIAGNOSIS — D649 Anemia, unspecified: Secondary | ICD-10-CM | POA: Diagnosis not present

## 2016-05-23 DIAGNOSIS — R5383 Other fatigue: Secondary | ICD-10-CM

## 2016-05-23 DIAGNOSIS — E559 Vitamin D deficiency, unspecified: Secondary | ICD-10-CM | POA: Diagnosis not present

## 2016-05-23 DIAGNOSIS — R059 Cough, unspecified: Secondary | ICD-10-CM

## 2016-05-23 DIAGNOSIS — J302 Other seasonal allergic rhinitis: Secondary | ICD-10-CM

## 2016-05-23 LAB — FERRITIN: Ferritin: 46 ng/mL (ref 10–232)

## 2016-05-23 LAB — CBC WITH DIFFERENTIAL/PLATELET
BASOS PCT: 0 %
Basophils Absolute: 0 cells/uL (ref 0–200)
EOS ABS: 583 {cells}/uL — AB (ref 15–500)
Eosinophils Relative: 11 %
HEMATOCRIT: 38.3 % (ref 35.0–45.0)
HEMOGLOBIN: 12.6 g/dL (ref 11.7–15.5)
LYMPHS ABS: 2226 {cells}/uL (ref 850–3900)
Lymphocytes Relative: 42 %
MCH: 28.9 pg (ref 27.0–33.0)
MCHC: 32.9 g/dL (ref 32.0–36.0)
MCV: 87.8 fL (ref 80.0–100.0)
MONO ABS: 318 {cells}/uL (ref 200–950)
MPV: 10 fL (ref 7.5–12.5)
Monocytes Relative: 6 %
Neutro Abs: 2173 cells/uL (ref 1500–7800)
Neutrophils Relative %: 41 %
Platelets: 273 10*3/uL (ref 140–400)
RBC: 4.36 MIL/uL (ref 3.80–5.10)
RDW: 14 % (ref 11.0–15.0)
WBC: 5.3 10*3/uL (ref 4.0–10.5)

## 2016-05-23 MED ORDER — VITAMIN D (ERGOCALCIFEROL) 1.25 MG (50000 UNIT) PO CAPS: 50000.0000 [IU] | ORAL_CAPSULE | ORAL | 0 refills | Status: DC

## 2016-05-23 NOTE — Patient Instructions (Signed)
  Given your complaints of upper back pain and similar discomfort with a prior pneumonia, go to [facility] imaging (either [address] or [place]) upon leaving the office today for chest x-ray. We will contact you later today with the results.  Drink plenty of water. Continue the Aleve-D twice daily as needed for congestion and sinus pain. Please take guaifenesin (Mucinex) regularly (if you have the 12 hour, take it twice daily (the plain or the DM--do NOT take mucinex-D), if you have a different kind, follow the directions on the box, and be sure that it doesn't also contain a decongestant--you don't want to be taking decongestant from the Aleve and from a mucinex combination). Hot steamy shower might also help with sinus and chest congestion. There seems to be a component of seasonal allergies--treat this with antihistamines such as allegra, zyrtec or claritin, or a flonase nasal spray. You may need to overlap the use of flonase with an antihistamine for about a week until the flonase is effective.  In general, once you complete the weekly course of prescription vitamin D, it is recommended that you take 1000-2000 IU of over the counter Vitamin D3, every day, long-term. We are checking your levels today to verify that you do, in fact, need another 12 weeks that was recently refilled for you.  We are checking your blood counts, and iron levels. If you are found to be iron deficient, please return the stool cards you were given today. This is looking for microscopic losses of blood in your GI tract, and if present, you will need to be evaluated by a gastroenterologist to see why you are losing blood.

## 2016-05-23 NOTE — Progress Notes (Signed)
Chief Complaint  Patient presents with  . Cough    and wheezing. Back is hurting. She has had pneumonia before and wants to make sure she doesn't have it now. Been going on about 2 month, went to ER back in Sept but was okay.    A week ago she started noticing chest congestion when laying down.  Last night she could hear herself breathing/wheezing.  She woke up with discomfort in her upper back today. She is concerned it could be pneumonia (has had in the past with similar symptoms).  She has been taking Aleve-D once or twice daily for the last week.  Took Mucinex once last week (when she felt short of breath laying down).    She has h/o seasonal allergies. +itchy eyes, itchy nose last night, +sneezing every morning. She has taken Allegra a few times in the last few days. She reports some ongoing symptoms since she was seen in ER on 03/26/16.  Mucus from the nose has been yellow over the last week, getting a little darker.  She feels like cough is wet, but unable to get any phlegm up.  She feels slightly short of breath when laying flat (due to the pressure on the chest).  Denies any shortness of breath with activity.  Just feels tired.  She has felt fatigued. She recently called for refill of her prescription vitamin D, picked it up earlier this week.  Review of chart--had TSH, b-met, CBC in May.  Mild anemia noted, Hg 11.2 (Calcium was slightly low at 8.5, but no albumin checked to see if this corrected) Vitamin D was last checked 06/2015, slightly low at 26, had been down to 22.  She hasn't been taking any kind of supplement in the last 2 months.  She had been on the prescription D prior to this. Restarted rx earlier this week.  She takes iron intermittently, only when she is tired.  She no longer has any menstrual cycles. She was recommended to do stool cards when she got her last lab results in May, but hasn't done this. Denies any rectal or vaginal bleeding.  She went to ER 9/12 with  complaint of chest congestion, laryngitis, sinus pressure.  Not given ABX; had normal CXR ([facility]).  PMH ,PSH, SH reviewed  Outpatient Encounter Prescriptions as of 05/23/2016  Medication Sig  . Vitamin D, Ergocalciferol, (DRISDOL) 50000 units CAPS capsule Take 1 capsule (50,000 Units total) by mouth every 7 (seven) days.  . [DISCONTINUED] amoxicillin (AMOXIL) 875 MG tablet Take 1 tablet (875 mg total) by mouth 2 (two) times daily.  . [DISCONTINUED] fluconazole (DIFLUCAN) 150 MG tablet Take 1 tablet once, may repeat in 1 week   No facility-administered encounter medications on file as of 05/23/2016.    Allergies  Allergen Reactions  . Contrast Media [Iodinated Diagnostic Agents] Itching    ROS:  Denies fever, chills (sometimes a little hot/cold sometimes at night).  Denies nausea ,vomiting, diarrhea, bleeding, bruising, rashes. Denies headaches (just frontal/sinus headaches on/off for the last month), dizziness, fainting spells. See HPI.  PHYSICAL EXAM:  BP 130/88 (BP Location: Left Arm, Patient Position: Sitting, Cuff Size: Normal)   Pulse 80   Temp 99 F (37.2 C) (Tympanic)   Ht 5' 5" (1.651 m)   Wt 215 lb (97.5 kg)   SpO2 98%   BMI 35.78 kg/m   Well appearing female in no distress.  No sniffling or coughing during visit, appears comfortable HEENT: PERRL, EOMi, conjunctiva and sclera are  clear. TM's and EAC's normal. OP is clear, no erythema or cobblestoning.  Nasal mucosa is mild-mod edematous, no erythema. Some clear-white mucus noted on the right, very light yellow on the left. Sinuses are nontender Neck: no lymphadenopathy, thyromegaly or mass Heart: regular rate and rhythm, no murmur Lungs: clear bilaterally. No wheeze, rales, ronchi Back: area of discomfort is thoracic spine, just to the left of midline.  Not really tender to palpation, no discomfort with stretching. No spinal or CVA tenderness Extremities: no edema Neuro: alert and oriented, cranial nerves  intact, normal strength, gait Skin: normal turgor, no rash  ASSESSMENT/PLAN:  Cough - Plan: DG Chest 2 View  Vitamin D deficiency - Plan: VITAMIN D 25 Hydroxy (Vit-D Deficiency, Fractures)  Fatigue, unspecified type - Plan: VITAMIN D 25 Hydroxy (Vit-D Deficiency, Fractures), CBC with Differential/Platelet, Ferritin, Iron  Anemia, unspecified type - Plan: CBC with Differential/Platelet, Ferritin, Iron  Seasonal allergic rhinitis, unspecified chronicity, unspecified trigger    Vit D, CBC, iron, ferritin  Given your complaints of upper back pain and similar discomfort with a prior pneumonia, go to [facility] imaging (either [address] or [place]) upon leaving the office today for chest x-ray. We will contact you later today with the results.  Drink plenty of water. Continue the Aleve-D twice daily as needed for congestion and sinus pain. Please take guaifenesin (Mucinex) regularly (if you have the 12 hour, take it twice daily (the plain or the DM--do NOT take mucinex-D), if you have a different kind, follow the directions on the box, and be sure that it doesn't also contain a decongestant--you don't want to be taking decongestant from the Aleve and from a mucinex combination). Hot steamy shower might also help with sinus and chest congestion. There seems to be a component of seasonal allergies--treat this with antihistamines such as allegra, zyrtec or claritin, or a flonase nasal spray. You may need to overlap the use of flonase with an antihistamine for about a week until the flonase is effective.  In general, once you complete the weekly course of prescription vitamin D, it is recommended that you take 1000-2000 IU of over the counter Vitamin D3, every day, long-term. We are checking your levels today to verify that you do, in fact, need another 12 weeks that was recently refilled for you.  We are checking your blood counts, and iron levels. If you are found to be iron deficient, please  return the stool cards you were given today. This is looking for microscopic losses of blood in your GI tract, and if present, you will need to be evaluated by a gastroenterologist to see why you are losing blood.

## 2016-05-24 LAB — IRON: IRON: 101 ug/dL (ref 40–190)

## 2016-05-24 LAB — VITAMIN D 25 HYDROXY (VIT D DEFICIENCY, FRACTURES): Vit D, 25-Hydroxy: 23 ng/mL — ABNORMAL LOW (ref 30–100)

## 2016-06-20 ENCOUNTER — Ambulatory Visit (INDEPENDENT_AMBULATORY_CARE_PROVIDER_SITE_OTHER): Payer: BLUE CROSS/BLUE SHIELD | Admitting: Family Medicine

## 2016-06-20 ENCOUNTER — Encounter: Payer: Self-pay | Admitting: Family Medicine

## 2016-06-20 VITALS — BP 120/70 | HR 70 | Wt 221.0 lb

## 2016-06-20 DIAGNOSIS — B9689 Other specified bacterial agents as the cause of diseases classified elsewhere: Secondary | ICD-10-CM

## 2016-06-20 DIAGNOSIS — N760 Acute vaginitis: Secondary | ICD-10-CM | POA: Diagnosis not present

## 2016-06-20 MED ORDER — METRONIDAZOLE 500 MG PO TABS: 500.0000 mg | ORAL_TABLET | Freq: Two times a day (BID) | ORAL | 0 refills | Status: DC

## 2016-06-20 NOTE — Progress Notes (Signed)
   Subjective:    Patient ID: Kristy [name], female    DOB: January 18, 1971, 45 y.o.   MRN: [id number]  HPI She complains of a one-day history of vaginal discharge that is clear. Does not itch or smell.   Review of Systems     Objective:   Physical Exam Vaginal exam shows no discharge and the mucosa appears normal. KOH was negative. Wet prep did show clue cells and bacteria.       Assessment & Plan:  BV (bacterial vaginosis) - Plan: metroNIDAZOLE (FLAGYL) 500 MG tablet Cautioned her concerning alcohol while taking the medication.

## 2016-07-15 DIAGNOSIS — Z9289 Personal history of other medical treatment: Secondary | ICD-10-CM

## 2016-07-15 HISTORY — DX: Personal history of other medical treatment: Z92.89

## 2016-07-30 DIAGNOSIS — N9489 Other specified conditions associated with female genital organs and menstrual cycle: Secondary | ICD-10-CM | POA: Diagnosis not present

## 2016-07-30 DIAGNOSIS — N760 Acute vaginitis: Secondary | ICD-10-CM | POA: Diagnosis not present

## 2016-07-30 DIAGNOSIS — N898 Other specified noninflammatory disorders of vagina: Secondary | ICD-10-CM | POA: Diagnosis not present

## 2016-07-30 DIAGNOSIS — B9689 Other specified bacterial agents as the cause of diseases classified elsewhere: Secondary | ICD-10-CM | POA: Diagnosis not present

## 2016-08-20 ENCOUNTER — Encounter: Payer: Self-pay | Admitting: Medical

## 2016-08-20 ENCOUNTER — Ambulatory Visit: Payer: BLUE CROSS/BLUE SHIELD | Admitting: Family Medicine

## 2016-08-20 ENCOUNTER — Ambulatory Visit (INDEPENDENT_AMBULATORY_CARE_PROVIDER_SITE_OTHER): Payer: BLUE CROSS/BLUE SHIELD | Admitting: Medical

## 2016-08-20 VITALS — BP 114/70 | HR 85 | Temp 98.1°F | Wt 214.6 lb

## 2016-08-20 DIAGNOSIS — R0981 Nasal congestion: Secondary | ICD-10-CM

## 2016-08-20 DIAGNOSIS — R05 Cough: Secondary | ICD-10-CM | POA: Diagnosis not present

## 2016-08-20 DIAGNOSIS — R0989 Other specified symptoms and signs involving the circulatory and respiratory systems: Secondary | ICD-10-CM | POA: Diagnosis not present

## 2016-08-20 DIAGNOSIS — R053 Chronic cough: Secondary | ICD-10-CM

## 2016-08-20 MED ORDER — HYDROCOD POLST-CPM POLST ER 10-8 MG/5ML PO SUER: 5.0000 mL | Freq: Two times a day (BID) | ORAL | 0 refills | Status: DC

## 2016-08-20 MED ORDER — ALBUTEROL SULFATE HFA 108 (90 BASE) MCG/ACT IN AERS: 2.0000 | INHALATION_SPRAY | Freq: Four times a day (QID) | RESPIRATORY_TRACT | 0 refills | Status: DC | PRN

## 2016-08-20 MED ORDER — PREDNISONE 10 MG PO TABS: ORAL_TABLET | ORAL | 0 refills | Status: DC

## 2016-08-20 NOTE — Progress Notes (Signed)
Subjective: Chief Complaint  Patient presents with  . congestion , coughing ,sob    congestion, coughing, sob    Here for cough, congestion, but last night problems breathing.   Slept sitting up last night.  She notes having ongoing cough since 05/2016 when she was here for eval.  Been mostly congestion more than anything.  Cough is not her main concern.   However, felt SOB on a plane yesterday.   She is currently taking amoxicillin given by gynecology.   Has few more days of this left.   Has sinus pressure, head and chest congestion, stopped up nose, using afrin some, mucinex DM.   No sore throat, no ear pain, no NVD, no wheezing.  No GERD, no belching, no abdominal pain.   No hx/o asthma.  Not on birth control, no recent trauma surgery or bed ridden recently.  No hx/o PE/DVT.  No calve pain or swelling. Nonsmoker.  No other aggravating or relieving factors. No other complaint.   Past Medical History:  Diagnosis Date  . Dysrhythmia    PT STATES IRREGULAR HEART BEAT YEARS AGO-HOLTER MONITOR NORMAL-PT HAS HAD NO PROBLEMS SINCE  . H. pylori infection 10/2010  . Iron deficiency anemia, unspecified   . Seasonal allergies    ragweed, tree pollen, mild and dust per testing 08/2012  . Unspecified vitamin D deficiency    Current Outpatient Prescriptions on File Prior to Visit  Medication Sig Dispense Refill  . Vitamin D, Ergocalciferol, (DRISDOL) 50000 units CAPS capsule Take 1 capsule (50,000 Units total) by mouth every 7 (seven) days. 12 capsule 0   No current facility-administered medications on file prior to visit.    Family History  Problem Relation Age of Onset  . Heart disease Mother 49    stent, CAD  . Cancer Mother 56    breast, with recurrence  . Cancer Father     esophageal cancer  . Heart disease Father 17    died of MI  . Alcohol abuse Father   . Hypertension Sister   . Hypertension Brother   . Hypertension Brother   . Cancer Maternal Grandmother     breast  . Diabetes Neg  Hx   . Stroke Neg Hx     ROS as in subjective   Objective: BP 114/70   Pulse 85   Temp 98.1 F (36.7 C)   Wt 214 lb 9.6 oz (97.3 kg)   SpO2 98%   BMI 35.71 kg/m   Wt Readings from Last 3 Encounters:  08/20/16 214 lb 9.6 oz (97.3 kg)  06/20/16 221 lb (100.2 kg)  05/23/16 215 lb (97.5 kg)   General appearance: alert, no distress, WD/WN,  HEENT: normocephalic, sclerae anicteric, TMs pearly, nares patent, no discharge or erythema, pharynx normal Oral cavity: MMM, no lesions Neck: supple, no lymphadenopathy, no thyromegaly, no masses Heart: RRR, normal S1, S2, no murmurs Lungs: CTA bilaterally, no wheezes, rhonchi, or rales Abdomen: +bs, soft, non tender, non distended, no masses, no hepatomegaly, no splenomegaly Pulses: 2+ symmetric, upper and lower extremities, normal cap refill No edema No calve tenderness, no swelling, -homans   Assessment: Encounter Diagnoses  Name Primary?  . Chronic cough Yes  . Head congestion   . Chest congestion     Plan: discussed differential, possible causes.   Begin medications below.  Rest, hydrate well, begin Benadryl QHS, finish amoxicillin.  She declines CXR, labs today.   If not seeing significant improvement within 3 days, then call back and  let me know.   Caution with afrin, avoid overuse or prolonged use.  Kristy [name] was seen today for congestion , coughing ,sob.  Diagnoses and all orders for this visit:  Chronic cough  Head congestion  Chest congestion  Other orders -     predniSONE (DELTASONE) 10 MG tablet; 6/5/4/3/2/1 -     chlorpheniramine-HYDROcodone (TUSSIONEX PENNKINETIC ER) 10-8 MG/5ML SUER; Take 5 mLs by mouth 2 (two) times daily. -     albuterol (PROVENTIL HFA;VENTOLIN HFA) 108 (90 Base) MCG/ACT inhaler; Inhale 2 puffs into the lungs every 6 (six) hours as needed for wheezing or shortness of breath.

## 2016-08-27 ENCOUNTER — Ambulatory Visit (INDEPENDENT_AMBULATORY_CARE_PROVIDER_SITE_OTHER): Payer: BLUE CROSS/BLUE SHIELD | Admitting: Medical

## 2016-08-27 ENCOUNTER — Encounter: Payer: Self-pay | Admitting: Medical

## 2016-08-27 ENCOUNTER — Ambulatory Visit
Admission: RE | Admit: 2016-08-27 | Discharge: 2016-08-27 | Disposition: A | Discharge status: Home / Self Care | Payer: BLUE CROSS/BLUE SHIELD | Source: Ambulatory Visit | Attending: Medical | Admitting: Medical

## 2016-08-27 VITALS — BP 122/70 | HR 99 | Temp 99.3°F | Wt 216.0 lb

## 2016-08-27 DIAGNOSIS — R053 Chronic cough: Secondary | ICD-10-CM

## 2016-08-27 DIAGNOSIS — R05 Cough: Secondary | ICD-10-CM

## 2016-08-27 DIAGNOSIS — R52 Pain, unspecified: Secondary | ICD-10-CM | POA: Diagnosis not present

## 2016-08-27 DIAGNOSIS — R6883 Chills (without fever): Secondary | ICD-10-CM

## 2016-08-27 LAB — POC INFLUENZA A&B (BINAX/QUICKVUE)
INFLUENZA A, POC: NEGATIVE
Influenza B, POC: NEGATIVE

## 2016-08-27 LAB — D-DIMER, QUANTITATIVE: D-Dimer, Quant: 0.56 mcg/mL FEU — ABNORMAL HIGH (ref <0.50)

## 2016-08-27 MED ORDER — PROMETHAZINE-DM 6.25-15 MG/5ML PO SYRP: 5.0000 mL | ORAL_SOLUTION | Freq: Four times a day (QID) | ORAL | 0 refills | Status: DC | PRN

## 2016-08-27 NOTE — Progress Notes (Signed)
Subjective: Chief Complaint  Patient presents with  . possible flu    body aches, chills  fever    Here for recheck on cough.   I saw her 08/20/16 for same.  The cough is not improved.  However, she developed new symptom sin the last few days including vomiting, fever, chills.  Still has sob, wheezing, cough nonstop.  The steroid, tussionex and albuterol from last visit didn't help.     She notes having ongoing cough since 05/2016 when she was here for eval.  Been mostly congestion more than anything.  No sore throat, no ear pain, no NVD, no wheezing.  No GERD, no belching, no abdominal pain.   No hx/o asthma.  Not on birth control, no recent trauma surgery or bed ridden recently.  No hx/o PE/DVT.  No calve pain or swelling. Nonsmoker.  No other aggravating or relieving factors. No other complaint.   Past Medical History:  Diagnosis Date  . Dysrhythmia    PT STATES IRREGULAR HEART BEAT YEARS AGO-HOLTER MONITOR NORMAL-PT HAS HAD NO PROBLEMS SINCE  . H. pylori infection 10/2010  . Iron deficiency anemia, unspecified   . Seasonal allergies    ragweed, tree pollen, mild and dust per testing 08/2012  . Unspecified vitamin D deficiency    Current Outpatient Prescriptions on File Prior to Visit  Medication Sig Dispense Refill  . chlorpheniramine-HYDROcodone (TUSSIONEX PENNKINETIC ER) 10-8 MG/5ML SUER Take 5 mLs by mouth 2 (two) times daily. 140 mL 0  . predniSONE (DELTASONE) 10 MG tablet 6/5/4/3/2/1 21 tablet 0  . Vitamin D, Ergocalciferol, (DRISDOL) 50000 units CAPS capsule Take 1 capsule (50,000 Units total) by mouth every 7 (seven) days. 12 capsule 0  . albuterol (PROVENTIL HFA;VENTOLIN HFA) 108 (90 Base) MCG/ACT inhaler Inhale 2 puffs into the lungs every 6 (six) hours as needed for wheezing or shortness of breath. (Patient not taking: Reported on 08/27/2016) 1 Inhaler 0   No current facility-administered medications on file prior to visit.    Family History  Problem Relation Age of Onset  .  Heart disease Mother 50    stent, CAD  . Cancer Mother 41    breast, with recurrence  . Cancer Father     esophageal cancer  . Heart disease Father 60    died of MI  . Alcohol abuse Father   . Hypertension Sister   . Hypertension Brother   . Hypertension Brother   . Cancer Maternal Grandmother     breast  . Diabetes Neg Hx   . Stroke Neg Hx     ROS as in subjective   Objective: BP 122/70   Pulse 99   Temp 99.3 F (37.4 C)   Wt 216 lb (98 kg)   SpO2 99%   BMI 35.94 kg/m   Wt Readings from Last 3 Encounters:  08/27/16 216 lb (98 kg)  08/20/16 214 lb 9.6 oz (97.3 kg)  06/20/16 221 lb (100.2 kg)   General appearance: alert, no distress, WD/WN,  HEENT: normocephalic, sclerae anicteric, TMs pearly, nares patent, no discharge or erythema, pharynx normal Oral cavity: MMM, no lesions Neck: supple, no lymphadenopathy, no thyromegaly, no masses, no JVD Heart: RRR, normal S1, S2, no murmurs Lungs: CTA bilaterally, no wheezes, rhonchi, or rales Abdomen: +bs, soft, non tender, non distended, no masses, no hepatomegaly, no splenomegaly Pulses: 2+ symmetric, upper and lower extremities, normal cap refill No edema No calve tenderness, no swelling, -homans   Assessment: Encounter Diagnoses  Name Primary?  Marland Kitchen  Chronic cough Yes  . Body aches   . Chills     Plan: discussed differential, possible causes.  Etiology unclear.  Need to rule out PE.   PFT abnormal today  Will send for CXR, D-dimer lab, possible CT chest.     Kristy [name] was seen today for possible flu.  Diagnoses and all orders for this visit:  Chronic cough -     D-dimer, quantitative (not at [facility]) -     DG Chest 2 View; Future -     Spirometry with Graph -     POC Influenza A&B(BINAX/QUICKVUE)  Body aches -     D-dimer, quantitative (not at [facility]) -     DG Chest 2 View; Future -     POC Influenza A&B(BINAX/QUICKVUE)  Chills -     D-dimer, quantitative (not at [facility]) -     DG Chest 2 View; Future -      POC Influenza A&B(BINAX/QUICKVUE)  Other orders -     promethazine-dextromethorphan (PROMETHAZINE-DM) 6.25-15 MG/5ML syrup; Take 5 mLs by mouth 4 (four) times daily as needed for cough.

## 2016-08-28 ENCOUNTER — Other Ambulatory Visit: Payer: Self-pay | Admitting: Internal Medicine

## 2016-08-28 ENCOUNTER — Ambulatory Visit
Admission: RE | Admit: 2016-08-28 | Discharge: 2016-08-28 | Disposition: A | Discharge status: Home / Self Care | Payer: BLUE CROSS/BLUE SHIELD | Source: Ambulatory Visit | Attending: Medical | Admitting: Medical

## 2016-08-28 DIAGNOSIS — R053 Chronic cough: Secondary | ICD-10-CM

## 2016-08-28 DIAGNOSIS — R0602 Shortness of breath: Secondary | ICD-10-CM

## 2016-08-28 DIAGNOSIS — R7989 Other specified abnormal findings of blood chemistry: Secondary | ICD-10-CM

## 2016-08-28 DIAGNOSIS — R05 Cough: Secondary | ICD-10-CM

## 2016-08-29 ENCOUNTER — Other Ambulatory Visit: Payer: Self-pay | Admitting: Medical

## 2016-08-29 ENCOUNTER — Telehealth: Payer: Self-pay

## 2016-08-29 ENCOUNTER — Ambulatory Visit
Admission: RE | Admit: 2016-08-29 | Discharge: 2016-08-29 | Disposition: A | Discharge status: Home / Self Care | Payer: BLUE CROSS/BLUE SHIELD | Source: Ambulatory Visit | Attending: Medical | Admitting: Medical

## 2016-08-29 DIAGNOSIS — R0602 Shortness of breath: Secondary | ICD-10-CM | POA: Diagnosis not present

## 2016-08-29 DIAGNOSIS — R05 Cough: Secondary | ICD-10-CM | POA: Diagnosis not present

## 2016-08-29 MED ORDER — IOPAMIDOL (ISOVUE-370) INJECTION 76%
80.0000 mL | Freq: Once | INTRAVENOUS | Status: AC | PRN
  Administered 2016-08-29: 80 mL via INTRAVENOUS

## 2016-08-29 MED ORDER — OMEPRAZOLE 40 MG PO CPDR: 40.0000 mg | DELAYED_RELEASE_CAPSULE | Freq: Every day | ORAL | 0 refills | Status: DC

## 2016-08-29 NOTE — Telephone Encounter (Signed)
[facility] called to let you know that pt's CTA came back negative for PE. Victorino December

## 2016-08-30 ENCOUNTER — Other Ambulatory Visit: Payer: Self-pay

## 2016-08-30 DIAGNOSIS — R0989 Other specified symptoms and signs involving the circulatory and respiratory systems: Secondary | ICD-10-CM

## 2016-08-30 DIAGNOSIS — R0789 Other chest pain: Secondary | ICD-10-CM

## 2016-09-04 ENCOUNTER — Ambulatory Visit (INDEPENDENT_AMBULATORY_CARE_PROVIDER_SITE_OTHER): Payer: BLUE CROSS/BLUE SHIELD | Admitting: Internal Medicine

## 2016-09-04 ENCOUNTER — Encounter: Payer: Self-pay | Admitting: Internal Medicine

## 2016-09-04 VITALS — BP 126/74 | HR 85 | Ht 65.0 in | Wt 209.0 lb

## 2016-09-04 DIAGNOSIS — R05 Cough: Secondary | ICD-10-CM | POA: Diagnosis not present

## 2016-09-04 DIAGNOSIS — R058 Other specified cough: Secondary | ICD-10-CM

## 2016-09-04 DIAGNOSIS — J309 Allergic rhinitis, unspecified: Secondary | ICD-10-CM | POA: Diagnosis not present

## 2016-09-04 DIAGNOSIS — R0609 Other forms of dyspnea: Secondary | ICD-10-CM | POA: Diagnosis not present

## 2016-09-04 DIAGNOSIS — R0600 Dyspnea, unspecified: Secondary | ICD-10-CM

## 2016-09-04 MED ORDER — FAMOTIDINE 20 MG PO TABS: ORAL_TABLET | ORAL | 11 refills | Status: DC

## 2016-09-04 MED ORDER — MONTELUKAST SODIUM 10 MG PO TABS: 10.0000 mg | ORAL_TABLET | Freq: Every day | ORAL | 11 refills | Status: DC

## 2016-09-04 NOTE — Progress Notes (Signed)
Subjective:     Patient ID: Kristy [name], female   DOB: [birth year]    MRN: [id number]  HPI  50 yobf never smoker works as Interior and spatial designer  with sinus issues around ? 2000 esp in fall better while she could get T[facility] then 2016 eval by allergy  ? [facility] pos dust/ ragweed didn't rec any shots and no f/u then variable sob assoc sense of congestion referred to pulmonary clinic 09/04/2016 by Boone Master.   09/04/2016 1st [facility] office visit/ Jerrilyn Messinger   Chief Complaint  Patient presents with  . Pulmonary Consult    Referred by Dr. Aleen Campi.  Pt c/o SOB for the past several months. She states also having some chest congestion. She has had cough x 2 wks- non prod and sometimes to the point of vomitting. Sometimes cough wakes her up at night.  She states she gets completely out of breath just walking one flight of stairs.   indolent onset 12/2015 of chest "congestion" and need to clear throat  present every day since onset onset   and unable to consistently lie flat due to choking sensation assoc hoarseness and dysphagia/  but s excess/ purulent sputum or mucus plugs. Cough so bad vomits / worse with albuterol   Better since started prilosec but not taking consistently Last pred x 6 days not recently ? Helped some    No obvious day to day or daytime variability or assoc excess/ purulent sputum or mucus plugs or hemoptysis or cp or chest tightness, subjective wheeze or overt sinus or hb symptoms. No unusual exp hx or h/o childhood pna/ asthma or knowledge of premature birth.   Also denies any obvious fluctuation of symptoms with weather or environmental changes or other aggravating or alleviating factors except as outlined above   Current Medications, Allergies, Complete Past Medical History, Past Surgical History, Family History, and Social History were reviewed in [employer] record.  ROS  The following are not active complaints unless bolded sore throat,  dysphagia, dental problems, itching, sneezing,  nasal congestion or excess/ purulent secretions, ear ache,   fever, chills, sweats, unintended wt loss, classically pleuritic or exertional cp,  orthopnea pnd or leg swelling, presyncope, palpitations, abdominal pain, anorexia, nausea, vomiting, diarrhea  or change in bowel or bladder habits, change in stools or urine, dysuria,hematuria,  rash, arthralgias, visual complaints, headache, numbness, weakness or ataxia or problems with walking or coordination,  change in mood/affect or memory.            Review of Systems     Objective:   Physical Exam   amb bf nad/ no cough at all during interview / exam    Wt Readings from Last 3 Encounters:  09/04/16 209 lb (94.8 kg)  08/27/16 216 lb (98 kg)  08/20/16 214 lb 9.6 oz (97.3 kg)    Vital signs reviewed - Note on arrival 02 sats  99% on RA       HEENT: nl dentition, turbinates bilaterally, and oropharynx (which is pristine) . Nl external ear canals without cough reflex   NECK :  without JVD/Nodes/TM/ nl carotid upstrokes bilaterally   LUNGS: no acc muscle use,  Nl contour chest which is clear to A and P bilaterally without cough on insp or exp maneuvers   CV:  RRR  no s3 or murmur or increase in P2, and no edema   ABD:  soft and nontender with nl inspiratory excursion in the supine position. No bruits or organomegaly  appreciated, bowel sounds nl  MS:  Nl gait/ ext warm without deformities, calf tenderness, cyanosis or clubbing No obvious joint restrictions   SKIN: warm and dry without lesions    NEURO:  alert, approp, nl sensorium with  no motor or cerebellar deficits apparent.    I personally reviewed images and agree with radiology impression as follows:  CTa Chest  08/29/16 No evidence of acute cardia pulmonary disease. No evidence of pulmonary thromboembolism.      Assessment:

## 2016-09-04 NOTE — Assessment & Plan Note (Signed)
Dates back several years prior to onset of "congestion" so reasonable to try singulair before nasal ics or dysmista (vs refer back to allergy for re-testing

## 2016-09-04 NOTE — Patient Instructions (Addendum)
Start singulair 10 mg every evening   Change Omeprazole 40 mg   Take  30-60 min before first meal of the day and Pepcid (famotidine)  20 mg one @  bedtime until return to office - this is the best way to tell whether stomach acid is contributing to your problem.   GERD (REFLUX)  is an extremely common cause of respiratory symptoms just like yours , many times with no obvious heartburn at all.    It can be treated with medication, but also with lifestyle changes including elevation of the head of your bed (ideally with 6 inch  bed blocks),  Smoking cessation, avoidance of late meals, excessive alcohol, and avoid fatty foods, chocolate, peppermint, colas, red wine, and acidic juices such as orange juice.  NO MINT OR MENTHOL PRODUCTS SO NO COUGH DROPS   USE SUGARLESS CANDY INSTEAD (Jolley ranchers or Stover's or Life Savers) or even ice chips will also do - the key is to swallow to prevent all throat clearing. NO OIL BASED VITAMINS - use powdered substitutes.   Please schedule a follow up office visit in 4 weeks, sooner if needed  with all medications so we can verify exactly what you are taking. This includes all medications from all doctors and over the counters

## 2016-09-04 NOTE — Assessment & Plan Note (Addendum)
09/04/2016  Walked RA x 3 laps @ 185 ft each stopped due to  End of study, nl pace, no sob or desat     Unable to reproduce this here, will focus therefore first on rx of uacs

## 2016-09-04 NOTE — Assessment & Plan Note (Signed)
Upper airway cough syndrome (previously labeled PNDS) , is  so named because it's frequently impossible to sort out how much is  CR/sinusitis with freq throat clearing (which can be related to primary GERD)   vs  causing  secondary (" extra esophageal")  GERD from wide swings in gastric pressure that occur with throat clearing, often  promoting self use of mint and menthol lozenges that reduce the lower esophageal sphincter tone and exacerbate the problem further in a cyclical fashion.   These are the same pts (now being labeled as having "irritable larynx syndrome" by some cough centers) who not infrequently have a history of having failed to tolerate ace inhibitors,  dry powder inhalers or biphosphonates or report having atypical/extraesophageal reflux symptoms that don't respond to standard doses of PPI  and are easily confused as having aecopd or asthma flares by even experienced allergists/ pulmonologists (myself included).   Of the three most common causes of chronic cough, only one (GERD)  can actually cause the other two (asthma and post nasal drip syndrome)  and perpetuate the cylce of cough inducing airway trauma, inflammation, heightened sensitivity to reflux which is prompted by the cough itself via a cyclical mechanism.    This may partially respond to steroids and look like asthma and post nasal drainage but never erradicated completely unless the cough and the secondary reflux are eliminated, preferably both at the same time.  While not intuitively obvious, many patients with chronic low grade reflux do not cough until there is a secondary insult that disturbs the protective epithelial barrier and exposes sensitive nerve endings.  This can be viral as may have been the case here  or direct physical injury such as with an endotracheal tube.   The point is that once this occurs, it is difficult to eliminate using anything but a maximally effective acid suppression regimen at least in the short  run, accompanied by an appropriate diet to address non acid GERD.   rec max gerd rx / trial of singulair/ f/u in 4 weeks  Total time devoted to counseling  > 50 % of initial 60 min office visit:  review case with pt/ discussion of options/alternatives/ personally creating written customized instructions  in presence of pt  then going over those specific  Instructions directly with the pt including how to use all of the meds but in particular covering each new medication in detail and the difference between the maintenance= "automatic" meds and the prns using an action plan format for the latter (If this problem/symptom => do that organization reading Left to right).  Please see AVS from this visit for a full list of these instructions which I personally wrote for this pt and  are unique to this visit.

## 2016-09-04 NOTE — Assessment & Plan Note (Signed)
Body mass index is 34.78  trending down, encouraged Lab Results  Component Value Date   TSH 1.14 12/13/2015     Contributing to gerd risk/ doe/reviewed the need and the process to achieve and maintain neg calorie balance > defer f/u primary care including intermittently monitoring thyroid status

## 2016-09-07 ENCOUNTER — Other Ambulatory Visit: Payer: Self-pay | Admitting: Medical

## 2016-10-02 ENCOUNTER — Encounter: Payer: Self-pay | Admitting: Internal Medicine

## 2016-10-02 ENCOUNTER — Ambulatory Visit (INDEPENDENT_AMBULATORY_CARE_PROVIDER_SITE_OTHER): Payer: BLUE CROSS/BLUE SHIELD | Admitting: Internal Medicine

## 2016-10-02 VITALS — BP 130/74 | HR 83 | Ht 65.0 in | Wt 213.2 lb

## 2016-10-02 DIAGNOSIS — R05 Cough: Secondary | ICD-10-CM

## 2016-10-02 DIAGNOSIS — R058 Other specified cough: Secondary | ICD-10-CM

## 2016-10-02 NOTE — Progress Notes (Signed)
Subjective:     Patient ID: Kristy [name], female   DOB: May 15, 1971    MRN: [id number]    Brief patient profile:  63 yobf never smoker works as Theme park manager  with sinus issues around ? 2000 esp in fall better while she could get Tavist then 2016 eval by allergy  ? [facility] office pos dust/ ragweed didn't rec any shots and no f/u then variable sob assoc sense of congestion referred to pulmonary clinic 09/04/2016 by Elray Mcgregor.   History of Present Illness  09/04/2016 1st [facility] office visit/ [facility]   Chief Complaint  Patient presents with  . Pulmonary Consult    Referred by Dr. Glade Lloyd.  Pt c/o SOB for the past several months. She states also having some chest congestion. She has had cough x 2 wks- non prod and sometimes to the point of vomitting. Sometimes cough wakes her up at night.  She states she gets completely out of breath just walking one flight of stairs.   indolent onset 12/2015 of chest "congestion" and need to clear throat  present every day since onset onset   and unable to consistently lie flat due to choking sensation assoc hoarseness and dysphagia/  but s excess/ purulent sputum or mucus plugs. Cough so bad vomits / worse with albuterol  Better since started prilosec but not taking consistently Last pred x 6 days not recently ? Helped some  rec Start singulair 10 mg every evening  Change Omeprazole 40 mg   Take  30-60 min before first meal of the day and Pepcid (famotidine)  20 mg one @  bedtime until return to office - this is the best way to tell whether stomach acid is contributing to your problem.  GERD  Diet    10/02/2016  f/u ov/[facility] re: cough since 12/2015 finally better  Chief Complaint  Patient presents with  . Follow-up    cough is much better  maint on ppi/h2hs and singulair with min daytime > noct cough and no sob  No obvious day to day or daytime variability or assoc  r cp or chest tightness, subjective wheeze or overt sinus or hb symptoms. No  unusual exp hx or h/o childhood pna/ asthma or knowledge of premature birth.  Sleeping ok without nocturnal  or early am exacerbation  of respiratory  c/o's or need for noct saba. Also denies any obvious fluctuation of symptoms with weather or environmental changes or other aggravating or alleviating factors except as outlined above   Current Medications, Allergies, Complete Past Medical History, Past Surgical History, Family History, and Social History were reviewed in [employer] record.  ROS  The following are not active complaints unless bolded sore throat, dysphagia, dental problems, itching, sneezing,  nasal congestion or excess/ purulent secretions, ear ache,   fever, chills, sweats, unintended wt loss, classically pleuritic or exertional cp, hemoptysis,  orthopnea pnd or leg swelling, presyncope, palpitations, abdominal pain, anorexia, nausea, vomiting, diarrhea  or change in bowel or bladder habits, change in stools or urine, dysuria,hematuria,  rash, arthralgias, visual complaints, headache, numbness, weakness or ataxia or problems with walking or coordination,  change in mood/affect or memory.                   Objective:   Physical Exam   amb bf nad/ mint gum in mouth    10/02/2016      213   09/04/16 209 lb (94.8 kg)  08/27/16 216 lb (98 kg)  08/20/16 214 lb 9.6 oz (97.3 kg)    Vital signs reviewed - Note on arrival 02 sats  100% on RA       HEENT: nl dentition, turbinates bilaterally, and oropharynx (which is pristine) . Nl external ear canals without cough reflex   NECK :  without JVD/Nodes/TM/ nl carotid upstrokes bilaterally   LUNGS: no acc muscle use,  Nl contour chest which is clear to A and P bilaterally without cough on insp or exp maneuvers   CV:  RRR  no s3 or murmur or increase in P2, and no edema   ABD:  soft and nontender with nl inspiratory excursion in the supine position. No bruits or organomegaly appreciated, bowel sounds  nl  MS:  Nl gait/ ext warm without deformities, calf tenderness, cyanosis or clubbing No obvious joint restrictions   SKIN: warm and dry without lesions              Assessment:

## 2016-10-02 NOTE — Patient Instructions (Signed)
Stay on the same medication x 2 months then try off prilosec but take pepcid 20 mg after bfast and supper an if your symptoms flare off prilosec then you need to see a GI and I will be happy to refer  GERD (REFLUX)  is an extremely common cause of respiratory symptoms just like yours , many times with no obvious heartburn at all.    It can be treated with medication, but also with lifestyle changes including elevation of the head of your bed (ideally with 6 inch  bed blocks),  Smoking cessation, avoidance of late meals, excessive alcohol, and avoid fatty foods, chocolate, peppermint, colas, red wine, and acidic juices such as orange juice.  NO MINT OR MENTHOL PRODUCTS SO NO COUGH DROPS  USE SUGARLESS CANDY INSTEAD (Jolley ranchers or Stover's or Life Savers) or even ice chips will also do - the key is to swallow to prevent all throat clearing. NO OIL BASED VITAMINS - use powdered substitutes.     Please schedule a follow up visit in 3 months but call sooner if needed

## 2016-10-03 NOTE — Assessment & Plan Note (Signed)
Body mass index is 35.48 kg/m.  trending up Lab Results  Component Value Date   TSH 1.14 12/13/2015     Contributing to gerd risk/ doe/reviewed the need and the process to achieve and maintain neg calorie balance > defer f/u primary care including intermittently monitoring thyroid status

## 2016-10-03 NOTE — Assessment & Plan Note (Signed)
Trial of max gerd rx/ singulair 09/04/2016 >> improved 10/02/2016   I had an extended final summary discussion with the patient reviewing all relevant studies completed to date and  lasting 15 to 20 minutes of a 25 minute visit on the following issues:    1) clearly better on singulair and max gerd meds but not still using mint/advised  2) Discussed the recent press about ppi's in the context of a statistically significant (but questionably clinically relevant) increase in CRI in pts on ppi vs h2's > bottom line is the lowest dose of ppi that controls   gerd is the right dose and if that dose is zero that's fine esp since h2's are cheaper but would only transition p 3 months of convincing response then if flares off ppi > gi eval  3) Each maintenance medication was reviewed in detail including most importantly the difference between maintenance and as needed and under what circumstances the prns are to be used.  Please see AVS for specific  Instructions which are unique to this visit and I personally typed out  which were reviewed in detail in writing with the patient and a copy provided.

## 2016-10-15 ENCOUNTER — Ambulatory Visit (INDEPENDENT_AMBULATORY_CARE_PROVIDER_SITE_OTHER): Payer: BLUE CROSS/BLUE SHIELD | Admitting: Family Medicine

## 2016-10-15 ENCOUNTER — Encounter: Payer: Self-pay | Admitting: Family Medicine

## 2016-10-15 VITALS — BP 120/80 | HR 70 | Temp 98.1°F | Wt 211.4 lb

## 2016-10-15 DIAGNOSIS — N898 Other specified noninflammatory disorders of vagina: Secondary | ICD-10-CM | POA: Diagnosis not present

## 2016-10-15 DIAGNOSIS — N760 Acute vaginitis: Secondary | ICD-10-CM

## 2016-10-15 DIAGNOSIS — R350 Frequency of micturition: Secondary | ICD-10-CM

## 2016-10-15 DIAGNOSIS — B9689 Other specified bacterial agents as the cause of diseases classified elsewhere: Secondary | ICD-10-CM

## 2016-10-15 LAB — POCT URINALYSIS DIPSTICK
BILIRUBIN UA: NEGATIVE
GLUCOSE UA: NEGATIVE
Ketones, UA: NEGATIVE
Leukocytes, UA: NEGATIVE
NITRITE UA: NEGATIVE
Protein, UA: NEGATIVE
Spec Grav, UA: 1.02 (ref 1.030–1.035)
UROBILINOGEN UA: NEGATIVE (ref ?–2.0)
pH, UA: 6.5 (ref 5.0–8.0)

## 2016-10-15 LAB — POCT WET PREP (WET MOUNT)
CLUE CELLS WET PREP WHIFF POC: POSITIVE
KOH Wet Prep POC: POSITIVE
TRICHOMONAS WET PREP HPF POC: ABSENT

## 2016-10-15 MED ORDER — FLUCONAZOLE 150 MG PO TABS: 150.0000 mg | ORAL_TABLET | Freq: Once | ORAL | 0 refills | Status: AC

## 2016-10-15 MED ORDER — METRONIDAZOLE 500 MG PO TABS: 500.0000 mg | ORAL_TABLET | Freq: Two times a day (BID) | ORAL | 0 refills | Status: DC

## 2016-10-15 NOTE — Progress Notes (Signed)
   Subjective:    Patient ID: Kristy [name], female    DOB: 18-Jul-1970, 46 y.o.   MRN: [id number]  HPI Chief Complaint  Patient presents with  . vaginal discharge    vaginal discharge, started tuesday of last week, itching, irriation, odor   She is here with complaints of a 7 day history of vaginal discharge, itching and bad odor. States she thinks she may have a yeast infection. Also complains of urinary frequency without dysuria, urgency or abdominal pain.   States her OB/GYN is at [facility] in [place]. States her OB/GYN has treated her in the past for BV and she had some "leftover Metrogel" so she took 2 doses of metrogel. States she does not typically have recurrent BV or infections.   Recent antibiotic use 4-6 weeks ago.   STI history: denies   LMP: ablation  States she is in a monogamous relationship and no new sexual partners.   Denies fever, chills, abdominal pain, back pain, N/V/D, urinary symptoms.   Reviewed allergies, medications, past medical, surgical,  and social history.    Review of Systems Pertinent positives and negatives in the history of present illness.     Objective:   Physical Exam  Constitutional: She appears well-developed and well-nourished. No distress.  Abdominal: Soft. She exhibits no distension. There is no tenderness.  Genitourinary: There is no rash, tenderness or lesion on the right labia. There is no rash, tenderness or lesion on the left labia. Vaginal discharge found.  Genitourinary Comments: Thin white adherent discharge  Skin: Skin is warm and dry. No rash noted. No pallor.   BP 120/80   Pulse 70   Temp 98.1 F (36.7 C) (Oral)   Wt 211 lb 6.4 oz (95.9 kg)   SpO2 98%   BMI 35.18 kg/m       Assessment & Plan:  Vaginal discharge - Plan: POCT Wet Prep [facility]), GC/Chlamydia Probe Amp  Urinary frequency - Plan: POCT urinalysis dipstick  BV (bacterial vaginosis) - Plan: metroNIDAZOLE (FLAGYL) 500 MG tablet  Urinalysis  dipstick: spec grav 1.020, blood 1+ (patient reports chronic hematuria and being worked up by urologist).  Wet prep +BV and few yeast. No trich.  Will treat with oral metronidazole, counseled on avoiding alcohol, and if symptoms have not resolved she will then take Diflucan. Advised to follow up with OB/GYN or PCP if having recurrent BV  Will check for GC/CT. Declines HIV, RPR and reports having recent neg test for this. f/u if not back to baseline.

## 2016-10-15 NOTE — Patient Instructions (Addendum)
Do not drink alcohol while taking metronidazole.   If your symptoms do not clear up on the metronidazole then take the one time dose of diflucan.   We will call with lab results.   Return for a repeat urine check in 2 weeks to make sure you no longer have blood in your urine.

## 2016-10-16 LAB — GC/CHLAMYDIA PROBE AMP
CT PROBE, AMP APTIMA: NOT DETECTED
GC Probe RNA: NOT DETECTED

## 2016-10-18 ENCOUNTER — Other Ambulatory Visit: Payer: Self-pay | Admitting: Medical

## 2016-11-21 ENCOUNTER — Encounter: Payer: Self-pay | Admitting: Obstetrics and Gynecology

## 2016-11-25 ENCOUNTER — Encounter: Payer: Self-pay | Admitting: Obstetrics and Gynecology

## 2016-11-25 ENCOUNTER — Ambulatory Visit (INDEPENDENT_AMBULATORY_CARE_PROVIDER_SITE_OTHER): Payer: BLUE CROSS/BLUE SHIELD | Admitting: Obstetrics and Gynecology

## 2016-11-25 VITALS — BP 120/86 | HR 72 | Ht 65.0 in | Wt 211.0 lb

## 2016-11-25 DIAGNOSIS — Z1231 Encounter for screening mammogram for malignant neoplasm of breast: Secondary | ICD-10-CM

## 2016-11-25 DIAGNOSIS — N898 Other specified noninflammatory disorders of vagina: Secondary | ICD-10-CM | POA: Diagnosis not present

## 2016-11-25 DIAGNOSIS — Z803 Family history of malignant neoplasm of breast: Secondary | ICD-10-CM

## 2016-11-25 DIAGNOSIS — N644 Mastodynia: Secondary | ICD-10-CM

## 2016-11-25 DIAGNOSIS — Z01419 Encounter for gynecological examination (general) (routine) without abnormal findings: Secondary | ICD-10-CM

## 2016-11-25 DIAGNOSIS — R319 Hematuria, unspecified: Secondary | ICD-10-CM | POA: Diagnosis not present

## 2016-11-25 DIAGNOSIS — Z1239 Encounter for other screening for malignant neoplasm of breast: Secondary | ICD-10-CM

## 2016-11-25 LAB — POCT WET PREP WITH KOH
Clue Cells Wet Prep HPF POC: NEGATIVE
KOH PREP POC: NEGATIVE
TRICHOMONAS UA: NEGATIVE
Yeast Wet Prep HPF POC: NEGATIVE

## 2016-11-25 LAB — POCT URINALYSIS DIPSTICK
Bilirubin, UA: NEGATIVE
GLUCOSE UA: NEGATIVE
Ketones, UA: NEGATIVE
Leukocytes, UA: NEGATIVE
NITRITE UA: NEGATIVE
PH UA: 5 (ref 5.0–8.0)
PROTEIN UA: NEGATIVE

## 2016-11-25 MED ORDER — AMPICILLIN 500 MG PO CAPS: 500.0000 mg | ORAL_CAPSULE | Freq: Four times a day (QID) | ORAL | 0 refills | Status: DC

## 2016-11-25 NOTE — Progress Notes (Signed)
Chief Complaint  Patient presents with  . Gynecologic Exam    still having that same d/c     HPI:      [name] is a 46 y.o. [id number] who LMP was No LMP recorded. Patient has had an ablation., presents today for her annual examination.  Her menses are absent after novasure ablation. She does not have intermenstrual bleeding.  Sex activity: single partner, contraception - tubal ligation.  Last Pap: April 06, 2015  Results were: no abnormalities /neg HPV DNA  Hx of STDs: none  She has a hx of BV and AV by One Swab 1/18. She had had recurrent BV sx all last yr. Pt did cleocin tx weekly for 3 months 1/18 with sx relief. Rx ran out and pt noticed increased d/c, irritation, and odor about 5 wks ago. She treated with 2 nights of leftover metrogel, and then with flagyl with her PCP. She was on abx before sx restarted. Pt still has sx.   Last mammogram: April 06, 2015  Results were: normal--routine follow-up in 12 months There is a FH of breast cancer in her mom/MGM. There is no FH of ovarian cancer. The patient does do self-breast exams. Pt was BRCA neg 7/14. Pt has noticed a LT breast lump for several yrs but not seen on last mammogram. Lump is tender at times; she drinks caffeine.   Tobacco use: The patient denies current or previous tobacco use. Alcohol use: social drinker Exercise: moderately active  She does get adequate calcium and Vitamin D in her diet.  She had hematuria with her PCP recently and wants urine checked today.   Past Medical History:  Diagnosis Date  . BRCA negative 01/2013  . Dysrhythmia    PT STATES IRREGULAR HEART BEAT YEARS AGO-HOLTER MONITOR NORMAL-PT HAS HAD NO PROBLEMS SINCE  . H. pylori infection 10/2010  . Iron deficiency anemia, unspecified   . Obesity   . Seasonal allergies    ragweed, tree pollen, mild and dust per testing 08/2012  . Unspecified vitamin D deficiency     Past Surgical History:  Procedure Laterality Date  .  CESAREAN SECTION    . [place] N/A 09/14/2015   Procedure: DILATATION & CURETTAGE/HYSTEROSCOPY WITH NOVASURE ABLATION;  Surgeon: Gae Dry, MD;  Location: [facility];  Service: Gynecology;  Laterality: N/A;  . IUD REMOVAL N/A 09/14/2015   Procedure: INTRAUTERINE DEVICE (IUD) REMOVAL;  Surgeon: Gae Dry, MD;  Location: [facility];  Service: Gynecology;  Laterality: N/A;  . LAPAROSCOPIC BILATERAL SALPINGECTOMY Bilateral 09/14/2015   Procedure: LAPAROSCOPIC BILATERAL SALPINGECTOMY;  Surgeon: Gae Dry, MD;  Location: [facility];  Service: Gynecology;  Laterality: Bilateral;  . TUBAL LIGATION Bilateral 09/14/2015   Procedure: BILATERAL TUBAL LIGATION;  Surgeon: Gae Dry, MD;  Location: [facility];  Service: Gynecology;  Laterality: Bilateral;  . VULVAR LESION REMOVAL N/A 09/14/2015   Procedure: VULVAR LESION;  Surgeon: Gae Dry, MD;  Location: [facility];  Service: Gynecology;  Laterality: N/A;  . WISDOM TOOTH EXTRACTION      Family History  Problem Relation Age of Onset  . Heart disease Mother 47       stent, CAD  . Cancer Mother 78       breast, with recurrence  . Cancer Father        esophageal cancer  . Heart disease Father 76       died of MI  . Alcohol abuse Father   .  Hypertension Sister   . Hypertension Brother   . Hypertension Brother   . Cancer Maternal Grandmother        breast  . Diabetes Neg Hx   . Stroke Neg Hx     Social History   Social History  . Marital status: Single    Spouse name: N/A  . Number of children: N/A  . Years of education: N/A   Occupational History  . Not on file.   Social History Main Topics  . Smoking status: Never Smoker  . Smokeless tobacco: Never Used  . Alcohol use No     Comment: maybe one drink a year  . Drug use: No  . Sexual activity: Yes     Comment: reports ablation and partner had vasectomy.    Other Topics Concern  . Not on file   Social History Narrative     In relationship x 6 mo, divorced, 1 child age 12 yo, exercise with classes, running, works as a Probation officer     Current Outpatient Prescriptions:  .  famotidine (PEPCID) 20 MG tablet, One at bedtime, Disp: 30 tablet, Rfl: 11 .  montelukast (SINGULAIR) 10 MG tablet, Take 1 tablet (10 mg total) by mouth at bedtime., Disp: 30 tablet, Rfl: 11 .  omeprazole (PRILOSEC) 40 MG capsule, TAKE ONE CAPSULE BY MOUTH EVERY DAY, Disp: 30 capsule, Rfl: 0 .  ampicillin (PRINCIPEN) 500 MG capsule, Take 1 capsule (500 mg total) by mouth 4 (four) times daily., Disp: 28 capsule, Rfl: 0 .  metroNIDAZOLE (FLAGYL) 500 MG tablet, Take 1 tablet (500 mg total) by mouth 2 (two) times daily. (Patient not taking: Reported on 11/25/2016), Disp: 14 tablet, Rfl: 0 .  Vitamin D, Ergocalciferol, (DRISDOL) 50000 units CAPS capsule, TAKE 1 CAPSULE (50,000 UNITS TOTAL) BY MOUTH EVERY 7 (SEVEN) DAYS. (Patient not taking: Reported on 11/25/2016), Disp: 4 capsule, Rfl: 3  ROS:  Review of Systems  Constitutional: Negative for fatigue, fever and unexpected weight change.  Respiratory: Negative for cough, shortness of breath and wheezing.   Cardiovascular: Negative for chest pain, palpitations and leg swelling.  Gastrointestinal: Negative for blood in stool, constipation, diarrhea, nausea and vomiting.  Endocrine: Negative for cold intolerance, heat intolerance and polyuria.  Genitourinary: Positive for hematuria and vaginal discharge. Negative for dyspareunia, dysuria, flank pain, frequency, genital sores, menstrual problem, pelvic pain, urgency, vaginal bleeding and vaginal pain.  Musculoskeletal: Negative for back pain, joint swelling and myalgias.  Skin: Negative for rash.  Neurological: Negative for dizziness, syncope, light-headedness, numbness and headaches.  Hematological: Negative for adenopathy.  Psychiatric/Behavioral: Negative for agitation, confusion, sleep disturbance and suicidal ideas. The patient is not  nervous/anxious.      Objective: BP 120/86 (Patient Position: Sitting)   Pulse 72   Ht _0  (1.651 m)   Wt 211 lb (95.7 kg)   BMI 35.11 kg/m    Physical Exam  Constitutional: She is oriented to person, place, and time. She appears well-developed and well-nourished.  Genitourinary: Uterus normal. There is no rash or tenderness on the right labia. There is no rash or tenderness on the left labia. No erythema or tenderness in the vagina. Vaginal discharge found. Right adnexum does not display mass and does not display tenderness. Left adnexum does not display mass and does not display tenderness. Cervix does not exhibit motion tenderness or polyp. Uterus is not enlarged or tender.  Neck: Normal range of motion. No thyromegaly present.  Cardiovascular: Normal rate, regular rhythm and normal heart sounds.  No murmur heard. Pulmonary/Chest: Effort normal and breath sounds normal. Right breast exhibits no mass, no nipple discharge, no skin change and no tenderness. Left breast exhibits tenderness. Left breast exhibits no mass, no nipple discharge and no skin change.    LT BREAST WITH PROMINENT RIDGE OF TISSUE WITHOUT DISCRETE MASS  Abdominal: Soft. There is no tenderness. There is no guarding.  Musculoskeletal: Normal range of motion.  Neurological: She is alert and oriented to person, place, and time. No cranial nerve deficit.  Psychiatric: She has a normal mood and affect. Her behavior is normal.  Vitals reviewed.   Results: Results for orders placed or performed in visit on 11/25/16 (from the past 24 hour(s))  POCT Urinalysis Dipstick     Status: Abnormal   Collection Time: 11/25/16  3:37 PM  Result Value Ref Range   Color, UA yellow    Clarity, UA clear    Glucose, UA neg    Bilirubin, UA neg    Ketones, UA neg    Spec Grav, UA >=1.030 (A) 1.010 - 1.025   Blood, UA 1+    pH, UA 5.0 5.0 - 8.0   Protein, UA neg    Urobilinogen, UA  0.2 or 1.0 E.U./dL   Nitrite, UA neg     Leukocytes, UA Negative Negative  POCT Wet Prep with KOH     Status: Normal   Collection Time: 11/25/16  3:37 PM  Result Value Ref Range   Trichomonas, UA Negative    Clue Cells Wet Prep HPF POC neg    Epithelial Wet Prep HPF POC  Few, Moderate, Many, Too numerous to count   Yeast Wet Prep HPF POC neg    Bacteria Wet Prep HPF POC  Few   RBC Wet Prep HPF POC     WBC Wet Prep HPF POC     KOH Prep POC Negative Negative    Assessment/Plan: Encounter for annual routine gynecological examination  Screening for breast cancer - Pt to sched mammo. - Plan: MM SCREENING BREAST TOMO BILATERAL  Family history of breast cancer - My Risk update testing discussed and handout given to pt. Declines for now. F/u prn. - Plan: MM SCREENING BREAST TOMO BILATERAL  Vaginal discharge - Neg wet prep/pos sx and exam. Treat for AV with amp 500 mg QID for 7 days. If sx persist, will treat for BV. - Plan: POCT Wet Prep with KOH, ampicillin (PRINCIPEN) 500 MG capsule  Hematuria, unspecified type - Pos dip. Pt to f/u with PCP. Notes sent to him. - Plan: POCT Urinalysis Dipstick  Breast tenderness - LT breast. No discrete mass on exam. Check scr mammo. D/C caffeine. F/u prn.             GYN counsel breast self exam, mammography screening, adequate intake of calcium and vitamin D, diet and exercise     F/U  Return in about 1 year (around 5/95/6387) for annual.  Alicia B. Copland, PA-C 11/25/2016 3:42 PM

## 2016-11-27 ENCOUNTER — Telehealth: Payer: Self-pay | Admitting: Obstetrics and Gynecology

## 2016-11-27 NOTE — Telephone Encounter (Signed)
Rx ampicillin 500 mg QID for 7 days prescribed for AV vaginitis sx. [facility] can't get Rx due to manufacturing problem. I spoke with [facility], and they have a few left or can order 125 mg dose. Pt lives in [place], so suggested pt call local pharmacies there to see if she can get Rx.  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 They are supposed to call me back in 1-2 days.

## 2016-11-28 ENCOUNTER — Other Ambulatory Visit: Payer: Self-pay | Admitting: Obstetrics and Gynecology

## 2016-11-28 MED ORDER — MOXIFLOXACIN HCL 400 MG PO TABS: 400.0000 mg | ORAL_TABLET | Freq: Every day | ORAL | 0 refills | Status: AC

## 2016-11-28 NOTE — Progress Notes (Signed)
Pt with AV sx. Rx ampicillin not available. Changed to avelox 400mg  daily for 6 days per [facility].  Rx eRxd. F/u prn.

## 2016-12-02 ENCOUNTER — Other Ambulatory Visit: Payer: Self-pay | Admitting: Obstetrics and Gynecology

## 2016-12-02 MED ORDER — FLUCONAZOLE 150 MG PO TABS: 150.0000 mg | ORAL_TABLET | Freq: Once | ORAL | 0 refills | Status: AC

## 2016-12-02 NOTE — Telephone Encounter (Signed)
Rx diflucan eRxd. RN to notify pt.

## 2016-12-02 NOTE — Telephone Encounter (Signed)
Pt called - antibx has caused a yeast inf.  Pt would like diflucan called in.  [phone]

## 2016-12-02 NOTE — Telephone Encounter (Signed)
Pt aware.

## 2016-12-04 ENCOUNTER — Telehealth: Payer: Self-pay

## 2016-12-04 NOTE — Telephone Encounter (Signed)
Spoke with pt. Pt took diflcuan 12/02/16. Needs to give it 24-36 hrs to work. Try OTC hydrocortisone crm ext prn itch. If sx persist, will send in terazol Rx. F/u prn. No longer using clindamycin.

## 2016-12-04 NOTE — Telephone Encounter (Signed)
Pt called.  Has been using some clindamycin cream b/c she thought it was for yeast inf until she read that it was form bv.  Has taken the diflucan.  It now itching more.  She thinks it's b/c she used the clindamycin.  Does she need another diflucan or what to do?  [phone]

## 2016-12-25 ENCOUNTER — Encounter: Payer: Self-pay | Admitting: Medical

## 2016-12-25 ENCOUNTER — Ambulatory Visit (INDEPENDENT_AMBULATORY_CARE_PROVIDER_SITE_OTHER): Payer: BLUE CROSS/BLUE SHIELD | Admitting: Medical

## 2016-12-25 VITALS — BP 110/74 | HR 74 | Wt 209.2 lb

## 2016-12-25 DIAGNOSIS — M79645 Pain in left finger(s): Secondary | ICD-10-CM | POA: Diagnosis not present

## 2016-12-25 DIAGNOSIS — M7989 Other specified soft tissue disorders: Secondary | ICD-10-CM | POA: Diagnosis not present

## 2016-12-25 MED ORDER — PHENTERMINE HCL 37.5 MG PO CAPS: 37.5000 mg | ORAL_CAPSULE | ORAL | 0 refills | Status: DC

## 2016-12-25 MED ORDER — PHENTERMINE HCL 37.5 MG PO TABS: 37.5000 mg | ORAL_TABLET | Freq: Every day | ORAL | 0 refills | Status: DC

## 2016-12-25 NOTE — Progress Notes (Signed)
Subjective: Chief Complaint  Patient presents with  . Rash    rash on finger x2 month , and wants a refill on diet    Here for 2 issues  She notes for the past 2 months has had unusual coloration and swelling of left index finger.   No trauma,no injury, no rash, no other similar findings.   It sometimes aches, and sometimes is itchy.  Denies fever, other joint aches, joint swelling, paresthesia.   She wants refill on Adipex.  Hasn't had this in months but it does help cravings.  She notes that sometimes she gives in to cravings, can't seem to control it.  Is exercising 2 days per week on her own,and a few days per week 30 min each with a trainer.   Tries to eat healthy.   Saw a nutritionist year ago.  She notes not doing as well on Qsymia prior.     Past Medical History:  Diagnosis Date  . BRCA negative 01/2013  . Dysrhythmia    PT STATES IRREGULAR HEART BEAT YEARS AGO-HOLTER MONITOR NORMAL-PT HAS HAD NO PROBLEMS SINCE  . H. pylori infection 10/2010  . Iron deficiency anemia, unspecified   . Obesity   . Seasonal allergies    ragweed, tree pollen, mild and dust per testing 08/2012  . Unspecified vitamin D deficiency    Current Outpatient Prescriptions on File Prior to Visit  Medication Sig Dispense Refill  . metroNIDAZOLE (FLAGYL) 500 MG tablet Take 1 tablet (500 mg total) by mouth 2 (two) times daily. (Patient not taking: Reported on 11/25/2016) 14 tablet 0  . montelukast (SINGULAIR) 10 MG tablet Take 1 tablet (10 mg total) by mouth at bedtime. (Patient not taking: Reported on 12/25/2016) 30 tablet 11  . omeprazole (PRILOSEC) 40 MG capsule TAKE ONE CAPSULE BY MOUTH EVERY DAY (Patient not taking: Reported on 12/25/2016) 30 capsule 0  . Vitamin D, Ergocalciferol, (DRISDOL) 50000 units CAPS capsule TAKE 1 CAPSULE (50,000 UNITS TOTAL) BY MOUTH EVERY 7 (SEVEN) DAYS. (Patient not taking: Reported on 11/25/2016) 4 capsule 3   No current facility-administered medications on file prior to visit.       ROS as in subjective   Objective: BP 110/74   Pulse 74   Wt 209 lb 3.2 oz (94.9 kg)   SpO2 94%   BMI 34.81 kg/m   General appearance: alert, no distress, WD/WN,  Neck: supple, no lymphadenopathy, no thyromegaly, no masses Heart: RRR, normal S1, S2, no murmurs Lungs: CTA bilaterally, no wheezes, rhonchi, or rales Abdomen: +bs, soft, non tender, non distended, no masses, no hepatomegaly, no splenomegaly Pulses: 2+ symmetric, upper and lower extremities, normal cap refill Left 2nd finger distal phalanx with somewhat of a bony fullness or subtle deformity in general, there is faint brown coloration of the distal phalanx dorsal side.  otherwise finger nontender, no swelling Hands and fingers neurovascularly intact Rest of hands/fingers unremarkable    Assessment: Encounter Diagnoses  Name Primary?  . Finger pain, left Yes  . Finger swelling   . Morbid obesity due to excess calories ([place])     Plan: Finger pain and swelling - go for xray.   Consider dermatology or ortho referral.  Etiology not clear  obesity - begin back short term Adipex, but check insurance coverage for long term weight management medications as discussed.  Discussed diet recommendations, exercise recommendations, goal setting, and f/u in 4-6 weeks.  Zuriah was seen today for rash.  Diagnoses and all orders for this visit:  Finger pain, left -     DG Finger Index Left; Future  Finger swelling -     DG Finger Index Left; Future  Morbid obesity due to excess calories ([place])  Other orders -     Discontinue: phentermine 37.5 MG capsule; Take 1 capsule (37.5 mg total) by mouth every morning. -     phentermine (ADIPEX-P) 37.5 MG tablet; Take 1 tablet (37.5 mg total) by mouth daily before breakfast.

## 2016-12-25 NOTE — Patient Instructions (Signed)
Check insurance cover for the following: Saxenda Contrave  Qsymia

## 2017-02-06 ENCOUNTER — Ambulatory Visit (INDEPENDENT_AMBULATORY_CARE_PROVIDER_SITE_OTHER): Payer: BLUE CROSS/BLUE SHIELD | Admitting: Medical

## 2017-02-06 ENCOUNTER — Encounter: Payer: Self-pay | Admitting: Medical

## 2017-02-06 VITALS — BP 144/88 | HR 74 | Wt 213.6 lb

## 2017-02-06 DIAGNOSIS — R030 Elevated blood-pressure reading, without diagnosis of hypertension: Secondary | ICD-10-CM | POA: Diagnosis not present

## 2017-02-06 DIAGNOSIS — H538 Other visual disturbances: Secondary | ICD-10-CM

## 2017-02-06 DIAGNOSIS — G4489 Other headache syndrome: Secondary | ICD-10-CM

## 2017-02-06 DIAGNOSIS — R002 Palpitations: Secondary | ICD-10-CM

## 2017-02-06 DIAGNOSIS — R5383 Other fatigue: Secondary | ICD-10-CM | POA: Diagnosis not present

## 2017-02-06 DIAGNOSIS — Z8249 Family history of ischemic heart disease and other diseases of the circulatory system: Secondary | ICD-10-CM

## 2017-02-06 DIAGNOSIS — R400 Somnolence: Secondary | ICD-10-CM

## 2017-02-06 LAB — BASIC METABOLIC PANEL
BUN: 8 mg/dL (ref 7–25)
CO2: 24 mmol/L (ref 20–31)
CREATININE: 0.86 mg/dL (ref 0.50–1.10)
Calcium: 8.9 mg/dL (ref 8.6–10.2)
Chloride: 103 mmol/L (ref 98–110)
Glucose, Bld: 86 mg/dL (ref 65–99)
Potassium: 4.1 mmol/L (ref 3.5–5.3)
Sodium: 137 mmol/L (ref 135–146)

## 2017-02-06 LAB — CBC
HCT: 38.4 % (ref 35.0–45.0)
Hemoglobin: 12.6 g/dL (ref 11.7–15.5)
MCH: 28.9 pg (ref 27.0–33.0)
MCHC: 32.8 g/dL (ref 32.0–36.0)
MCV: 88.1 fL (ref 80.0–100.0)
MPV: 10 fL (ref 7.5–12.5)
PLATELETS: 271 10*3/uL (ref 140–400)
RBC: 4.36 MIL/uL (ref 3.80–5.10)
RDW: 14.1 % (ref 11.0–15.0)
WBC: 5.4 10*3/uL (ref 4.0–10.5)

## 2017-02-06 LAB — LIPID PANEL
CHOL/HDL RATIO: 3 ratio (ref <5.0)
CHOLESTEROL: 209 mg/dL — AB (ref <200)
HDL: 69 mg/dL (ref >50)
LDL Cholesterol: 126 mg/dL — ABNORMAL HIGH (ref <100)
TRIGLYCERIDES: 72 mg/dL (ref <150)
VLDL: 14 mg/dL (ref <30)

## 2017-02-06 LAB — TSH: TSH: 1.54 mIU/L

## 2017-02-06 MED ORDER — TRAMADOL HCL 50 MG PO TABS: 50.0000 mg | ORAL_TABLET | Freq: Three times a day (TID) | ORAL | 0 refills | Status: DC | PRN

## 2017-02-06 NOTE — Progress Notes (Signed)
Subjective: Chief Complaint  Patient presents with  . high b/p and blurred vision    high b/p and blurred  vision headaches, started on sunday    Here for c/o elevated BP, headache, nausea, palpitations.    Last night 144/96 BP at pharmacy.  Later in the evening was normal reading.   Been having some headaches since Sunday, having intermittent palpitations, but they last 10+ minutes sometimes maybe even 30 minutes, up to 3 palpation episodes daily.  No recent changes in caffeine.  She is only getting about 5 hours of sleep but this isn't new.    Has had increased stress.   She has a foster child 27yo in the house, having some issues with her behavior.   This past weekend there was behavior issues, and the child is in the hospital currently.   Denies eating more salt than usual.   No hx/o high blood pressure.  She does have significant family hx/o heart disease in both parents.    She restarted phentermine in June, but has taken this not very often.  Has taken on three 1/2 tablet of Phentermine since 01/12/17.  Not taking even weekly.   No other aggravating or relieving factors. No other complaint.  Past Medical History:  Diagnosis Date  . BRCA negative 01/2013  . Dysrhythmia    PT STATES IRREGULAR HEART BEAT YEARS AGO-HOLTER MONITOR NORMAL-PT HAS HAD NO PROBLEMS SINCE  . H. pylori infection 10/2010  . Iron deficiency anemia, unspecified   . Obesity   . Seasonal allergies    ragweed, tree pollen, mild and dust per testing 08/2012  . Unspecified vitamin D deficiency    Current Outpatient Prescriptions on File Prior to Visit  Medication Sig Dispense Refill  . montelukast (SINGULAIR) 10 MG tablet Take 1 tablet (10 mg total) by mouth at bedtime. 30 tablet 11  . omeprazole (PRILOSEC) 40 MG capsule TAKE ONE CAPSULE BY MOUTH EVERY DAY 30 capsule 0  . phentermine (ADIPEX-P) 37.5 MG tablet Take 1 tablet (37.5 mg total) by mouth daily before breakfast. (Patient not taking: Reported on 02/06/2017) 30  tablet 0  . Vitamin D, Ergocalciferol, (DRISDOL) 50000 units CAPS capsule TAKE 1 CAPSULE (50,000 UNITS TOTAL) BY MOUTH EVERY 7 (SEVEN) DAYS. (Patient not taking: Reported on 11/25/2016) 4 capsule 3   No current facility-administered medications on file prior to visit.    Family History  Problem Relation Age of Onset  . Heart disease Mother 28       stent, CAD  . Breast cancer Mother 93       with recurrence about 20  . Cancer Father        esophageal cancer  . Heart disease Father 26       died of MI  . Alcohol abuse Father   . Hypertension Sister   . Hypertension Brother   . Hypertension Brother   . Breast cancer Maternal Grandmother 4  . Diabetes Neg Hx   . Stroke Neg Hx     ROS as in subjective   Objective: BP (!) 144/88   Pulse 74   Wt 213 lb 9.6 oz (96.9 kg)   SpO2 95%   BMI 35.54 kg/m   BP Readings from Last 3 Encounters:  02/06/17 (!) 144/88  12/25/16 110/74  11/25/16 120/86   Wt Readings from Last 3 Encounters:  02/06/17 213 lb 9.6 oz (96.9 kg)  12/25/16 209 lb 3.2 oz (94.9 kg)  11/25/16 211 lb (95.7 kg)  General appearance: alert, no distress, WD/WN, AA female HEENT: normocephalic, sclerae anicteric, PERRLA, EOMi, nares patent, no discharge or erythema, pharynx normal Oral cavity: MMM, no lesions Neck: supple, no lymphadenopathy, no thyromegaly, no masses, no bruits Heart: RRR, normal S1, S2, no murmurs Lungs: CTA bilaterally, no wheezes, rhonchi, or rales Extremities: no edema, no cyanosis, no clubbing Pulses: 2+ symmetric, upper and lower extremities, normal cap refill Neurological: alert, oriented x 3, CN2-12 intact, strength normal upper extremities and lower extremities, sensation normal throughout, DTRs 2+ throughout, no cerebellar signs, gait normal Psychiatric: normal affect, behavior normal, pleasant    Adult ECG Report  Indication: palpitations  Rate: 64 bpm  Rhythm: normal sinus rhythm  QRS Axis: 71 degrees  PR Interval: 162m  QRS  Duration: 938m QTc: 41683mConduction Disturbances: none  Other Abnormalities: none  Patient's cardiac risk factors are: family history of premature cardiovascular disease and obesity (BMI >= 30 kg/m2).  EKG comparison: none  Narrative Interpretation: normal EKG     Assessment: Encounter Diagnoses  Name Primary?  . Palpitation Yes  . Elevated blood-pressure reading without diagnosis of hypertension   . Blurred vision   . Family history of heart disease   . Headache syndrome   . Other fatigue   . Daytime somnolence      Plan: Discussed symptoms, concerns.   She has had some recent stressors, sleep is not that great in general.  Counseled on sleep hygiene.   Consider sleep study.   Labs today.   EKG reviewed.  Stop phentermine.  Medication below for worse headache, can use OTC Tyelnol.  Bayler was seen today for high b/p and blurred vision.  Diagnoses and all orders for this visit:  Palpitation -     EKG 12-Lead -     Basic metabolic panel -     CBC -     TSH -     Lipid panel  Elevated blood-pressure reading without diagnosis of hypertension -     EKG 12-Lead -     Basic metabolic panel -     CBC -     TSH -     Lipid panel  Blurred vision -     EKG 12-Lead -     Basic metabolic panel -     CBC -     TSH -     Lipid panel  Family history of heart disease -     EKG 12-Lead -     Basic metabolic panel -     CBC -     TSH -     Lipid panel  Headache syndrome  Other fatigue  Daytime somnolence  Other orders -     traMADol (ULTRAM) 50 MG tablet; Take 1 tablet (50 mg total) by mouth every 8 (eight) hours as needed.

## 2017-02-10 ENCOUNTER — Other Ambulatory Visit: Payer: Self-pay

## 2017-02-10 DIAGNOSIS — Z8249 Family history of ischemic heart disease and other diseases of the circulatory system: Secondary | ICD-10-CM

## 2017-02-10 DIAGNOSIS — R002 Palpitations: Secondary | ICD-10-CM

## 2017-02-11 ENCOUNTER — Ambulatory Visit (INDEPENDENT_AMBULATORY_CARE_PROVIDER_SITE_OTHER): Payer: BLUE CROSS/BLUE SHIELD | Admitting: Obstetrics and Gynecology

## 2017-02-11 ENCOUNTER — Encounter: Payer: Self-pay | Admitting: Obstetrics and Gynecology

## 2017-02-11 VITALS — BP 122/82 | HR 72 | Ht 65.0 in | Wt 217.0 lb

## 2017-02-11 DIAGNOSIS — N760 Acute vaginitis: Secondary | ICD-10-CM | POA: Diagnosis not present

## 2017-02-11 DIAGNOSIS — N898 Other specified noninflammatory disorders of vagina: Secondary | ICD-10-CM

## 2017-02-11 DIAGNOSIS — B9689 Other specified bacterial agents as the cause of diseases classified elsewhere: Secondary | ICD-10-CM

## 2017-02-11 LAB — POCT WET PREP WITH KOH
Clue Cells Wet Prep HPF POC: POSITIVE
KOH Prep POC: POSITIVE — AB
Trichomonas, UA: NEGATIVE
YEAST WET PREP PER HPF POC: NEGATIVE

## 2017-02-11 MED ORDER — FLUCONAZOLE 150 MG PO TABS: 150.0000 mg | ORAL_TABLET | Freq: Once | ORAL | 0 refills | Status: AC

## 2017-02-11 MED ORDER — CLINDAMYCIN HCL 300 MG PO CAPS: 300.0000 mg | ORAL_CAPSULE | Freq: Two times a day (BID) | ORAL | 0 refills | Status: AC

## 2017-02-11 NOTE — Progress Notes (Signed)
Chief Complaint  Patient presents with  . Vaginal Discharge    HPI:      [name] is a 46 y.o. [id number] who LMP was No LMP recorded. Patient has had an ablation., presents today for BV sx again. She has noticed increased vag d/c with odor and vaginal itching ext and internally. Sx for the past 3 days. She has not used any meds to treat. She has a hx of recurrent BV and yeast. She has been treated several times recently with flagyl. She has used cleocin in the past as maintenance tx with sx relief. She is sex active, sometimes using condoms. She uses dove regular soap or caress, no dryer sheets.      Past Medical History:  Diagnosis Date  . BRCA negative 01/2013  . Dysrhythmia    PT STATES IRREGULAR HEART BEAT YEARS AGO-HOLTER MONITOR NORMAL-PT HAS HAD NO PROBLEMS SINCE  . H. pylori infection 10/2010  . Iron deficiency anemia, unspecified   . Obesity   . Seasonal allergies    ragweed, tree pollen, mild and dust per testing 08/2012  . Unspecified vitamin D deficiency     Past Surgical History:  Procedure Laterality Date  . CESAREAN SECTION    . [place] N/A 09/14/2015   Procedure: DILATATION & CURETTAGE/HYSTEROSCOPY WITH NOVASURE ABLATION;  Surgeon: Gae Dry, MD;  Location: [facility];  Service: Gynecology;  Laterality: N/A;  . IUD REMOVAL N/A 09/14/2015   Procedure: INTRAUTERINE DEVICE (IUD) REMOVAL;  Surgeon: Gae Dry, MD;  Location: [facility];  Service: Gynecology;  Laterality: N/A;  . LAPAROSCOPIC BILATERAL SALPINGECTOMY Bilateral 09/14/2015   Procedure: LAPAROSCOPIC BILATERAL SALPINGECTOMY;  Surgeon: Gae Dry, MD;  Location: [facility];  Service: Gynecology;  Laterality: Bilateral;  . TUBAL LIGATION Bilateral 09/14/2015   Procedure: BILATERAL TUBAL LIGATION;  Surgeon: Gae Dry, MD;  Location: [facility];  Service: Gynecology;  Laterality: Bilateral;  . VULVAR LESION REMOVAL N/A 09/14/2015   Procedure:  VULVAR LESION;  Surgeon: Gae Dry, MD;  Location: [facility];  Service: Gynecology;  Laterality: N/A;  . WISDOM TOOTH EXTRACTION      Family History  Problem Relation Age of Onset  . Heart disease Mother 25       stent, CAD  . Breast cancer Mother 32       with recurrence about 56  . Cancer Father        esophageal cancer  . Heart disease Father 3       died of MI  . Alcohol abuse Father   . Hypertension Sister   . Hypertension Brother   . Hypertension Brother   . Breast cancer Maternal Grandmother 46  . Diabetes Neg Hx   . Stroke Neg Hx     Social History   Social History  . Marital status: Single    Spouse name: N/A  . Number of children: N/A  . Years of education: N/A   Occupational History  . Not on file.   Social History Main Topics  . Smoking status: Never Smoker  . Smokeless tobacco: Never Used  . Alcohol use No     Comment: maybe one drink a year  . Drug use: No  . Sexual activity: Yes     Comment: reports ablation and partner had vasectomy.    Other Topics Concern  . Not on file   Social History Narrative   In relationship x 6 mo, divorced, 1 child  age 26 yo, exercise with classes, running, works as a Probation officer     Current Outpatient Prescriptions:  .  clindamycin (CLEOCIN) 300 MG capsule, Take 1 capsule (300 mg total) by mouth 2 (two) times daily., Disp: 14 capsule, Rfl: 0 .  fluconazole (DIFLUCAN) 150 MG tablet, Take 1 tablet (150 mg total) by mouth once., Disp: 1 tablet, Rfl: 0 .  montelukast (SINGULAIR) 10 MG tablet, Take 1 tablet (10 mg total) by mouth at bedtime., Disp: 30 tablet, Rfl: 11 .  omeprazole (PRILOSEC) 40 MG capsule, TAKE ONE CAPSULE BY MOUTH EVERY DAY, Disp: 30 capsule, Rfl: 0 .  phentermine (ADIPEX-P) 37.5 MG tablet, Take 1 tablet (37.5 mg total) by mouth daily before breakfast. (Patient not taking: Reported on 02/06/2017), Disp: 30 tablet, Rfl: 0 .  traMADol (ULTRAM) 50 MG tablet, Take 1 tablet (50 mg total) by mouth every  8 (eight) hours as needed., Disp: 15 tablet, Rfl: 0 .  Vitamin D, Ergocalciferol, (DRISDOL) 50000 units CAPS capsule, TAKE 1 CAPSULE (50,000 UNITS TOTAL) BY MOUTH EVERY 7 (SEVEN) DAYS. (Patient not taking: Reported on 11/25/2016), Disp: 4 capsule, Rfl: 3   ROS:  Review of Systems  Constitutional: Negative for fever.  Gastrointestinal: Negative for blood in stool, constipation, diarrhea, nausea and vomiting.  Genitourinary: Positive for vaginal discharge. Negative for dyspareunia, dysuria, flank pain, frequency, hematuria, urgency, vaginal bleeding and vaginal pain.  Musculoskeletal: Negative for back pain.  Skin: Negative for rash.     OBJECTIVE:   Vitals:  BP 122/82   Pulse 72   Ht 5' 5"  (1.651 m)   Wt 217 lb (98.4 kg)   BMI 36.11 kg/m   Physical Exam  Constitutional: She is oriented to person, place, and time and well-developed, well-nourished, and in no distress. Vital signs are normal.  Genitourinary: Uterus normal, cervix normal, right adnexa normal, left adnexa normal and vulva normal. Uterus is not enlarged. Cervix exhibits no motion tenderness and no tenderness. Right adnexum displays no mass and no tenderness. Left adnexum displays no mass and no tenderness. Vulva exhibits no erythema, no exudate, no lesion, no rash and no tenderness. Vagina exhibits no lesion. Thin  white and vaginal discharge found.  Neurological: She is oriented to person, place, and time.  Vitals reviewed.   Results: Results for orders placed or performed in visit on 02/11/17 (from the past 24 hour(s))  POCT Wet Prep with KOH     Status: Abnormal   Collection Time: 02/11/17  5:14 PM  Result Value Ref Range   Trichomonas, UA Negative    Clue Cells Wet Prep HPF POC pos    Epithelial Wet Prep HPF POC  Few, Moderate, Many, Too numerous to count   Yeast Wet Prep HPF POC neg    Bacteria Wet Prep HPF POC  Few   RBC Wet Prep HPF POC     WBC Wet Prep HPF POC     KOH Prep POC Positive (A) Negative      Assessment/Plan: Bacterial vaginosis - Rx clindamycin eRxd. Will RF if sx recur. May need maintenance again. Use condoms/Dove sens skin soap. Rx diflucan prn itch/abx tx. F/u prn.  - Plan: clindamycin (CLEOCIN) 300 MG capsule, POCT Wet Prep with KOH  Vaginal discharge - Plan: fluconazole (DIFLUCAN) 150 MG tablet, POCT Wet Prep with KOH    Meds ordered this encounter  Medications  . clindamycin (CLEOCIN) 300 MG capsule    Sig: Take 1 capsule (300 mg total) by mouth 2 (two) times daily.  Dispense:  14 capsule    Refill:  0  . fluconazole (DIFLUCAN) 150 MG tablet    Sig: Take 1 tablet (150 mg total) by mouth once.    Dispense:  1 tablet    Refill:  0      Return if symptoms worsen or fail to improve.  Ferlin [place] B. Dmarion Perfect, PA-C 02/11/2017 5:16 PM

## 2017-03-03 NOTE — Progress Notes (Signed)
Cardiology Office Note    Date:  03/05/2017   ID:  Kristy [name], DOB 09-Mar-1971, MRN [id number]  PCP:  Carlena Hurl, PA-C  Cardiologist:  Previously Seen by Dr. Wynonia Lawman in 2014  --> Now Dr. Debara Pickett  Chief Complaint  Patient presents with  . New Patient (Initial Visit)    Palpitations    History of Present Illness:    Kristy [name] is a 46 y.o. female with past medical history of PVC's (documented by event monitor in 2014), GERD, and allergies who presents to the office today as a new patient referral for evaluation of palpitations at the request of Kristy Bode, PA-C.   She was followed by Dr. Wynonia Lawman in 2014 for palpitations and an event monitor at that time showed bigeminal PVC's. An echo was also obtained and showed an EF of 55% by review of notes but the official report is not available.   She was evaluated by her PCP on 02/06/2017 and reported having intermittent palpitations lasting 10-30 minutes and occurring multiple times per day. She reported only getting approximately 5 hours sleep as she was under increased social stress. CBC and BMET were without acute abnormalities. TSH at 1.54. Lipid Panel showed total cholesterol of 209, HDL 69, and LDL 126. EKG showed NSR, HR 64, with no acute ST or T-wave changes.   In talking with the patient today, she reports having palpitations ever since 2014 and denies any acute worsening of her symptoms since then. Her palpitations typically last for a few seconds at a time but occasionally last for up to 20 minutes. She feels like her heart is "skipping beats" and is unsure if it is going fast. She does note mild fatigue with this. No associated dyspnea, lightheadedness, or presyncope. She does not consume alcohol but is an avid caffeine consumer, as she drinks 2-3 sweet teas per day along with Herbalife tea. She has tried to reduce her caffeine consumption but experiences headaches with this. Her palpitations are sometimes exacerbated by  spicy foods.   She does not exercise regularly but is active at baseline as she is able to perform household chores and yard-work and denies any chest pain with this. Does have dyspnea on exertion. No orthopnea, PND, or lower extremity edema.   She denies any known history of HTN, HLD, Type 2 DM, or personal history of CAD. No prior tobacco use. She does have a strong family history of CAD with her brother having an MI at age 6 and mother and father having MI's in their 59's.    Past Medical History:  Diagnosis Date  . BRCA negative 01/2013  . Dysrhythmia    a. 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 H. pylori infection 10/2010  . Iron deficiency anemia, unspecified   . Obesity   . Seasonal allergies    ragweed, tree pollen, mild and dust per testing 08/2012  . Unspecified vitamin D deficiency     Past Surgical History:  Procedure Laterality Date  . CESAREAN SECTION    . [place] N/A 09/14/2015   Procedure: DILATATION & CURETTAGE/HYSTEROSCOPY WITH NOVASURE ABLATION;  Surgeon: Kristy Dry, MD;  Location: [facility];  Service: Gynecology;  Laterality: N/A;  . IUD REMOVAL N/A 09/14/2015   Procedure: INTRAUTERINE DEVICE (IUD) REMOVAL;  Surgeon: Kristy Dry, MD;  Location: [facility];  Service: Gynecology;  Laterality: N/A;  . LAPAROSCOPIC BILATERAL SALPINGECTOMY Bilateral 09/14/2015   Procedure: LAPAROSCOPIC BILATERAL SALPINGECTOMY;  Surgeon: Kristy Baltimore  Salli Quarry, MD;  Location: [facility];  Service: Gynecology;  Laterality: Bilateral;  . TUBAL LIGATION Bilateral 09/14/2015   Procedure: BILATERAL TUBAL LIGATION;  Surgeon: Kristy Dry, MD;  Location: [facility];  Service: Gynecology;  Laterality: Bilateral;  . VULVAR LESION REMOVAL N/A 09/14/2015   Procedure: VULVAR LESION;  Surgeon: Kristy Dry, MD;  Location: [facility];  Service: Gynecology;  Laterality: N/A;  . WISDOM TOOTH EXTRACTION      Current Medications: Outpatient Medications Prior  to Visit  Medication Sig Dispense Refill  . montelukast (SINGULAIR) 10 MG tablet Take 1 tablet (10 mg total) by mouth at bedtime. 30 tablet 11  . omeprazole (PRILOSEC) 40 MG capsule TAKE ONE CAPSULE BY MOUTH EVERY DAY 30 capsule 0  . phentermine (ADIPEX-P) 37.5 MG tablet Take 1 tablet (37.5 mg total) by mouth daily before breakfast. (Patient not taking: Reported on 02/06/2017) 30 tablet 0  . traMADol (ULTRAM) 50 MG tablet Take 1 tablet (50 mg total) by mouth every 8 (eight) hours as needed. 15 tablet 0  . Vitamin D, Ergocalciferol, (DRISDOL) 50000 units CAPS capsule TAKE 1 CAPSULE (50,000 UNITS TOTAL) BY MOUTH EVERY 7 (SEVEN) DAYS. (Patient not taking: Reported on 11/25/2016) 4 capsule 3   No facility-administered medications prior to visit.      Allergies:   Contrast media [iodinated diagnostic agents]   Social History   Social History  . Marital status: Single    Spouse name: N/A  . Number of children: N/A  . Years of education: N/A   Social History Main Topics  . Smoking status: Never Smoker  . Smokeless tobacco: Never Used  . Alcohol use No     Comment: maybe one drink a year  . Drug use: No  . Sexual activity: Yes     Comment: reports ablation and partner had vasectomy.    Other Topics Concern  . None   Social History Narrative   In relationship x 6 mo, divorced, 1 child age 19 yo, exercise with classes, running, works as a Probation officer     Family History:  The patient's family history includes Alcohol abuse in her father; Breast cancer (age of onset: 19) in her mother; Breast cancer (age of onset: 3) in her maternal grandmother; CAD in her brother; Cancer in her father; Heart disease (age of onset: 34) in her mother; Heart disease (age of onset: 14) in her father; Hypertension in her brother, brother, and sister.   Review of Systems:   Please see the history of present illness.     General:  No chills, fever, night sweats or weight changes.  Cardiovascular:  No chest  pain, edema, orthopnea, paroxysmal nocturnal dyspnea. Positive for palpitations and dyspnea on exertion.  Dermatological: No rash, lesions/masses Respiratory: No cough, dyspnea Urologic: No hematuria, dysuria Abdominal:   No nausea, vomiting, diarrhea, bright red blood per rectum, melena, or hematemesis Neurologic:  No visual changes, wkns, changes in mental status. All other systems reviewed and are otherwise negative except as noted above.   Physical Exam:    VS:  BP 120/84   Pulse 67   Ht 5' 5" (1.651 m)   Wt 213 lb (96.6 kg)   SpO2 98%   BMI 35.45 kg/m    General: Well developed, well nourished [place] American female appearing in no acute distress. Head: Normocephalic, atraumatic, sclera non-icteric, no xanthomas, nares are without discharge.  Neck: No carotid bruits. JVD not elevated.  Lungs: Respirations regular and unlabored, without wheezes  or rales.  Heart: Regular rate and rhythm. No S3 or S4.  No murmur, no rubs, or gallops appreciated. Abdomen: Soft, non-tender, non-distended with normoactive bowel sounds. No hepatomegaly. No rebound/guarding. No obvious abdominal masses. Msk:  Strength and tone appear normal for age. No joint deformities or effusions. Extremities: No clubbing or cyanosis. No lower extremity edema.  Distal pedal pulses are 2+ bilaterally. Neuro: Alert and oriented X 3. Moves all extremities spontaneously. No focal deficits noted. Psych:  Responds to questions appropriately with a normal affect. Skin: No rashes or lesions noted  Wt Readings from Last 3 Encounters:  03/05/17 213 lb (96.6 kg)  02/11/17 217 lb (98.4 kg)  02/06/17 213 lb 9.6 oz (96.9 kg)    Studies/Labs Reviewed:   EKG:  EKG is not ordered today.  Recent Labs: 02/06/2017: BUN 8; Creat 0.86; Hemoglobin 12.6; Platelets 271; Potassium 4.1; Sodium 137; TSH 1.54   Lipid Panel    Component Value Date/Time   CHOL 209 (H) 02/06/2017 0823   TRIG 72 02/06/2017 0823   HDL 69 02/06/2017  0823   CHOLHDL 3.0 02/06/2017 0823   VLDL 14 02/06/2017 0823   LDLCALC 126 (H) 02/06/2017 0823    Additional studies/ records that were reviewed today include:   02/06/2017: EKG showed NSR, HR 64, with no acute ST or T-wave changes.   Assessment:    1. Heart palpitations   2. PVC (premature ventricular contraction)   3. Dyspnea on exertion   4. Family history of coronary artery disease      Plan:   In order of problems listed above:  1. Palpitations/ PVC's - she reports having palpitations for several years which usually lasts for a few seconds at a time but occasionally last for up to 20 minutes. She feels like her heart is "skipping beats" and is unsure if it is going fast. No associated dyspnea, lightheadedness, or presyncope. Exacerbated by caffeine consumption and spicy foods.  - prior event monitor in 2014 showed bigeminal PVC's. Recent EKG was obtained by her PCP and showed NSR with no acute findings. CBC and BMET were without acute abnormalities. TSH at 1.54.  - overall, her symptoms do seem most consistent with previously diagnosed PAC's and PVC's. She denies any acute worsening of her symptoms. We discussed that if these intensify in severity, we could place a repeat cardiac monitor but she wishes to hold off on this for now. Recommended gradually reducing her caffeine intake as this is likely playing a significant role in her ectopy.  - she wishes to avoid taking daily BB therapy but is open to having an Rx for PRN Propranolol. She can take this as needed for palpitations lasting longer than 15 minutes. If she finds she is having to use this multiple times per week, can switch to an extended-release formulation.   2. Dyspnea on Exertion/ Family History of CAD - she does not exercise regularly but does note dyspnea on exertion when carrying out daily activities. No associated chest discomfort. Her biggest cardiac risk factor is family history of CAD as her brother had an MI at  age 69. - will plan for an Exercise Tolerance Test for initial assessment and risk stratification.   The above was discussed with Dr. Debara Pickett (DOD) and he was in agreement with the above assessment and plan.    Medication Adjustments/Labs and Tests Ordered: Current medicines are reviewed at length with the patient today.  Concerns regarding medicines are outlined above.  Medication changes, Labs  and Tests ordered today are listed in the Patient Instructions below. Patient Instructions  Medication Instructions:  TAKE Propanlol 48m Take 1 tablet up to 3 times a day as needed for persistent palpitations.   Labwork: None   Testing/Procedures: Your physician has requested that you have an exercise tolerance test. For further information please visit [email or web address] Please also follow instruction sheet, as given.   Follow-Up: Your physician wants you to follow-up in: 12 months with Dr HDebara Pickett You will receive a reminder letter in the mail two months in advance. If you don't receive a letter, please call our office to schedule the follow-up appointment.  Any Other Special Instructions Will Be Listed Below (If Applicable).  If you need a refill on your cardiac medications before your next appointment, please call your pharmacy.     Signed, BErma Heritage PA-C  03/05/2017 3:23 PM    [facility] [address] [place]  [id number]: [phone] Fax: [phone] [address] [place] [address]Phone: [phone]

## 2017-03-05 ENCOUNTER — Ambulatory Visit (INDEPENDENT_AMBULATORY_CARE_PROVIDER_SITE_OTHER): Payer: BLUE CROSS/BLUE SHIELD | Admitting: Student

## 2017-03-05 ENCOUNTER — Encounter: Payer: Self-pay | Admitting: Student

## 2017-03-05 VITALS — BP 120/84 | HR 67 | Ht 65.0 in | Wt 213.0 lb

## 2017-03-05 DIAGNOSIS — I493 Ventricular premature depolarization: Secondary | ICD-10-CM | POA: Diagnosis not present

## 2017-03-05 DIAGNOSIS — Z8249 Family history of ischemic heart disease and other diseases of the circulatory system: Secondary | ICD-10-CM | POA: Diagnosis not present

## 2017-03-05 DIAGNOSIS — R0609 Other forms of dyspnea: Secondary | ICD-10-CM

## 2017-03-05 DIAGNOSIS — R0600 Dyspnea, unspecified: Secondary | ICD-10-CM

## 2017-03-05 DIAGNOSIS — R002 Palpitations: Secondary | ICD-10-CM

## 2017-03-05 MED ORDER — PROPRANOLOL HCL 20 MG PO TABS: 20.0000 mg | ORAL_TABLET | Freq: Three times a day (TID) | ORAL | 1 refills | Status: DC | PRN

## 2017-03-05 NOTE — Patient Instructions (Signed)
Medication Instructions:  TAKE Propanlol 20mg  Take 1 tablet up to 3 times a day as needed  Labwork: None   Testing/Procedures: Your physician has requested that you have an exercise tolerance test. For further information please visit [email or web address]. Please also follow instruction sheet, as given.   Follow-Up: Your physician wants you to follow-up in: 12 months with Dr Debara Pickett. You will receive a reminder letter in the mail two months in advance. If you don't receive a letter, please call our office to schedule the follow-up appointment.  Any Other Special Instructions Will Be Listed Below (If Applicable).  If you need a refill on your cardiac medications before your next appointment, please call your pharmacy.

## 2017-03-07 ENCOUNTER — Ambulatory Visit (INDEPENDENT_AMBULATORY_CARE_PROVIDER_SITE_OTHER): Payer: BLUE CROSS/BLUE SHIELD | Admitting: Family Medicine

## 2017-03-07 ENCOUNTER — Telehealth (HOSPITAL_COMMUNITY): Payer: Self-pay

## 2017-03-07 ENCOUNTER — Encounter: Payer: Self-pay | Admitting: Family Medicine

## 2017-03-07 VITALS — BP 120/80 | HR 109 | Wt 214.6 lb

## 2017-03-07 DIAGNOSIS — N898 Other specified noninflammatory disorders of vagina: Secondary | ICD-10-CM | POA: Diagnosis not present

## 2017-03-07 DIAGNOSIS — L298 Other pruritus: Secondary | ICD-10-CM | POA: Diagnosis not present

## 2017-03-07 DIAGNOSIS — B373 Candidiasis of vulva and vagina: Secondary | ICD-10-CM

## 2017-03-07 DIAGNOSIS — B3731 Acute candidiasis of vulva and vagina: Secondary | ICD-10-CM

## 2017-03-07 LAB — POCT WET PREP (WET MOUNT)
CLUE CELLS WET PREP WHIFF POC: NEGATIVE
KOH Wet Prep POC: POSITIVE
TRICHOMONAS WET PREP HPF POC: ABSENT

## 2017-03-07 MED ORDER — FLUCONAZOLE 150 MG PO TABS: 150.0000 mg | ORAL_TABLET | Freq: Once | ORAL | 0 refills | Status: AC

## 2017-03-07 NOTE — Telephone Encounter (Signed)
Encounter complete. 

## 2017-03-07 NOTE — Patient Instructions (Signed)

## 2017-03-07 NOTE — Progress Notes (Signed)
   Subjective:    Patient ID: Kristy [name], female    DOB: July 03, 1971, 46 y.o.   MRN: [id number]  HPI Chief Complaint  Patient presents with  . vaginal itching.    vaginal itching since yesterday. discharge 2 days ago   She is here with a 2 day history of white, thick discharge and vaginal itching.   Recent antibiotic use, Clindamycin for BV prescribed by her OB/GYN.    Denies fever, chills, N/V/D, urinary symptoms.   No new sexual partners.   Reviewed allergies, medications, past medical, surgical,  and social history.    Review of Systems Pertinent positives and negatives in the history of present illness.     Objective:   Physical Exam  Constitutional: She appears well-developed and well-nourished. No distress.  Genitourinary: Vaginal discharge found.  Genitourinary Comments: Thick white discharge   BP 120/80   Pulse (!) 109   Wt 214 lb 9.6 oz (97.3 kg)   BMI 35.71 kg/m       Assessment & Plan:  Vaginal itching - Plan: POCT Wet Prep [facility] [place])  Vaginal discharge - Plan: POCT Wet Prep (Wet [place])  Candida vaginitis - Plan: fluconazole (DIFLUCAN) 150 MG tablet  Wet mount +yeast, -BV, -Trich.  Diflucan sent to pharmacy.  She will follow up if not back to baseline with [place] or her OB/GYN.

## 2017-03-11 ENCOUNTER — Telehealth (HOSPITAL_COMMUNITY): Payer: Self-pay

## 2017-03-11 NOTE — Telephone Encounter (Signed)
Encounter complete. 

## 2017-03-12 ENCOUNTER — Ambulatory Visit (HOSPITAL_COMMUNITY)
Admission: RE | Admit: 2017-03-12 | Discharge: 2017-03-12 | Disposition: A | Discharge status: Home / Self Care | Payer: BLUE CROSS/BLUE SHIELD | Source: Ambulatory Visit | Attending: Cardiology | Admitting: Cardiology

## 2017-03-12 DIAGNOSIS — R0609 Other forms of dyspnea: Secondary | ICD-10-CM | POA: Insufficient documentation

## 2017-03-12 DIAGNOSIS — I493 Ventricular premature depolarization: Secondary | ICD-10-CM | POA: Insufficient documentation

## 2017-03-12 LAB — EXERCISE TOLERANCE TEST
CHL CUP MPHR: 176 {beats}/min
CHL CUP RESTING HR STRESS: 68 {beats}/min
CSEPEDS: 40 s
CSEPEW: 11 METS
CSEPPHR: 187 {beats}/min
Exercise duration (min): 9 min
Percent HR: 106 %
RPE: 18

## 2017-03-19 ENCOUNTER — Encounter: Payer: Self-pay | Admitting: Obstetrics and Gynecology

## 2017-03-19 ENCOUNTER — Other Ambulatory Visit: Payer: Self-pay | Admitting: Obstetrics and Gynecology

## 2017-03-19 ENCOUNTER — Encounter (INDEPENDENT_AMBULATORY_CARE_PROVIDER_SITE_OTHER): Payer: BLUE CROSS/BLUE SHIELD | Admitting: Obstetrics and Gynecology

## 2017-03-19 DIAGNOSIS — B9689 Other specified bacterial agents as the cause of diseases classified elsewhere: Secondary | ICD-10-CM

## 2017-03-19 DIAGNOSIS — N760 Acute vaginitis: Principal | ICD-10-CM

## 2017-03-19 MED ORDER — METRONIDAZOLE 500 MG PO TABS: ORAL_TABLET | ORAL | 0 refills | Status: DC

## 2017-03-19 MED ORDER — CLINDAMYCIN PHOSPHATE 2 % VA CREA: TOPICAL_CREAM | VAGINAL | 2 refills | Status: DC

## 2017-03-19 NOTE — Progress Notes (Signed)
This encounter was created in error - please disregard.  This encounter was created in error - please disregard.

## 2017-03-19 NOTE — Progress Notes (Signed)
Pt has recurrent BV sx. Came to office but pt has been treated for recurrent BV many times. I told pt I'd eRx Rx RF prn sx. Same sx as always. Rx flagyl and then cleocin vag crm once wkly as maintenance. Did this in past with sx control. Add probiotics, use condoms. No new sex partners.

## 2017-04-23 ENCOUNTER — Other Ambulatory Visit: Payer: Self-pay | Admitting: Medical

## 2017-04-23 ENCOUNTER — Encounter: Payer: Self-pay | Admitting: Medical

## 2017-04-23 ENCOUNTER — Telehealth: Payer: Self-pay | Admitting: Medical

## 2017-04-23 ENCOUNTER — Ambulatory Visit
Admission: RE | Admit: 2017-04-23 | Discharge: 2017-04-23 | Disposition: A | Discharge status: Home / Self Care | Payer: BLUE CROSS/BLUE SHIELD | Source: Ambulatory Visit | Attending: Medical | Admitting: Medical

## 2017-04-23 ENCOUNTER — Ambulatory Visit (INDEPENDENT_AMBULATORY_CARE_PROVIDER_SITE_OTHER): Payer: BLUE CROSS/BLUE SHIELD | Admitting: Medical

## 2017-04-23 VITALS — BP 130/88 | HR 72 | Wt 221.0 lb

## 2017-04-23 DIAGNOSIS — M25551 Pain in right hip: Secondary | ICD-10-CM | POA: Insufficient documentation

## 2017-04-23 DIAGNOSIS — E669 Obesity, unspecified: Secondary | ICD-10-CM | POA: Diagnosis not present

## 2017-04-23 DIAGNOSIS — R002 Palpitations: Secondary | ICD-10-CM

## 2017-04-23 NOTE — Telephone Encounter (Signed)
Called and notified pt of this, she said that she will call and talk with her insurance and will call us back .

## 2017-04-23 NOTE — Telephone Encounter (Signed)
After reviewing chart, we could try either a medication called Contrave taken 2 tablets BID or we could try a daily injectable small needle pen device called Saxenda.  Both are good options.  I recommend the Saxenda.    Have her call insurance to see if Contrave or Kirke Shaggy is covered and we can call it out.  I wont write medication if she is not exercising.  She needs to fine a way to exercise, so if hips and feet bother her, can use hand bike or lap swimming or water aerobics at the [facility]  Consider Weight Watches program or other nutrition program or we can refer to nutritionist.    Set weight loss goals.     Lets plan f/u on weight in 4-6 wk assuming she starts a medication.

## 2017-04-23 NOTE — Progress Notes (Signed)
Subjective: Chief Complaint  Patient presents with  . Hip Pain    right hip x 1 month  no injury , hurts when sitting for long periods of time    Here for right hip pain x 1-2 months.   No injury, no trauma, no swelling.   No numbness or tingling in legs.  She does have hx/o plantar fasciitis and obesity.   Hurts getting in and out of the car.   Not exercising.    No left hip pain.  No knee pain or ankle pain.   Does get some low back pain, but this was ongoing prior than the hip issue  Also wants to try a different weight loss medication.   She used phentermine but it gave her bad headaches.  Has also used Qsymia prior.  Exercises when possible but the hip pain and plantar fascitis has been limiting exercise.   Trying to eat healthy.   Past Medical History:  Diagnosis Date  . BRCA negative 01/2013  . Dysrhythmia    a. 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 H. pylori infection 10/2010  . Iron deficiency anemia, unspecified   . Obesity   . Seasonal allergies    ragweed, tree pollen, mild and dust per testing 08/2012  . Unspecified vitamin D deficiency    Current Outpatient Prescriptions on File Prior to Visit  Medication Sig Dispense Refill  . clindamycin (CLEOCIN) 2 % vaginal cream 1 applicatorful vaginally once weekly as maintenance for 3 months (Patient not taking: Reported on 04/23/2017) 40 g 2  . propranolol (INDERAL) 20 MG tablet Take 1 tablet (20 mg total) by mouth 3 (three) times daily as needed. TAKE AS NEEDED FOR PALPITATIONS (Patient not taking: Reported on 04/23/2017) 90 tablet 1   No current facility-administered medications on file prior to visit.    ROS as in subjective   Objective: BP 130/88   Pulse 72   Wt 221 lb (100.2 kg)   SpO2 96%   BMI 36.78 kg/m   Wt Readings from Last 3 Encounters:  04/23/17 221 lb (100.2 kg)  03/19/17 213 lb (96.6 kg)  03/07/17 214 lb 9.6 oz (97.3 kg)   Gen: wd, wn, nad Pain noted in right hip with internal ROM which is somewhat  mildly limited.   otherwise nontender, no swelling, no deformity.  Tender over bilat plantar fascia, but relatively good arches, Rest of knee and leg exam unremarkable.      No edema Legs neurovascularly intact Back: nontender, normal ROM.  There is slight asymmetry of creases of mid back, but no obvious leg length discrepancy    Assessment: Encounter Diagnoses  Name Primary?  . Hip pain, right Yes  . Obesity with serious comorbidity, unspecified classification, unspecified obesity type   . Heart palpitations     Plan: Hip pain - go for xray.   Pain seems to be in the join, and not bursitis or other.    Obesity - reviewed cardiology evaluation and stress test from 02/2017, labs from  01/2017.   After reviewed the records we will use trial of either Contrave or Saxenda to help with weight loss efforts.  I will have her check insurance on this.  Work on [employer], exercise, and setting weight loss goals.  Plan f/u in 4-6 wk.   palpations - resolved, not currently having to use propranolol.      Lavanda was seen today for hip pain.  Diagnoses and all orders for this visit:  Hip pain, right -     Cancel: DG HIP UNILAT W OR W/O PELVIS 2-3 VIEWS LEFT; Future  Obesity with serious comorbidity, unspecified classification, unspecified obesity type  Heart palpitations

## 2017-04-28 ENCOUNTER — Telehealth: Payer: Self-pay

## 2017-04-28 DIAGNOSIS — M25551 Pain in right hip: Secondary | ICD-10-CM

## 2017-04-28 NOTE — Telephone Encounter (Signed)
-----   Message from Carlena Hurl, PA-C sent at 04/24/2017  7:13 PM EDT ----- Yes, please refer

## 2017-05-12 ENCOUNTER — Encounter: Payer: Self-pay | Admitting: Physical Therapy

## 2017-05-12 ENCOUNTER — Ambulatory Visit: Payer: BLUE CROSS/BLUE SHIELD | Attending: Medical | Admitting: Physical Therapy

## 2017-05-12 DIAGNOSIS — M25651 Stiffness of right hip, not elsewhere classified: Secondary | ICD-10-CM

## 2017-05-12 DIAGNOSIS — R262 Difficulty in walking, not elsewhere classified: Secondary | ICD-10-CM | POA: Insufficient documentation

## 2017-05-12 DIAGNOSIS — M25551 Pain in right hip: Secondary | ICD-10-CM | POA: Diagnosis not present

## 2017-05-12 NOTE — Therapy (Signed)
[facility] [place], [place], [address] Phone: [phone]   Fax:  [phone]  Physical Therapy Evaluation  Patient Details  Name: Kristy [name] MRN: [id number] Date of Birth: 30-Apr-1971 Referring Provider: Chana Bode PA-C   Encounter Date: 05/12/2017      PT End of Session - 05/12/17 1444    Visit Number 1   Number of Visits 16   Date for PT Re-Evaluation 07/07/17   Authorization Type BCBS    PT Start Time 1022   PT Stop Time 1100   PT Time Calculation (min) 38 min   Activity Tolerance Patient tolerated treatment well   Behavior During Therapy [facility] for tasks assessed/performed      Past Medical History:  Diagnosis Date  . BRCA negative 01/2013  . Dysrhythmia    a. 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 H. pylori infection 10/2010  . Iron deficiency anemia, unspecified   . Obesity   . Seasonal allergies    ragweed, tree pollen, mild and dust per testing 08/2012  . Unspecified vitamin D deficiency     Past Surgical History:  Procedure Laterality Date  . CESAREAN SECTION    . Mier N/A 09/14/2015   Procedure: DILATATION & CURETTAGE/HYSTEROSCOPY WITH NOVASURE ABLATION;  Surgeon: Gae Dry, MD;  Location: [facility];  Service: Gynecology;  Laterality: N/A;  . IUD REMOVAL N/A 09/14/2015   Procedure: INTRAUTERINE DEVICE (IUD) REMOVAL;  Surgeon: Gae Dry, MD;  Location: [facility];  Service: Gynecology;  Laterality: N/A;  . LAPAROSCOPIC BILATERAL SALPINGECTOMY Bilateral 09/14/2015   Procedure: LAPAROSCOPIC BILATERAL SALPINGECTOMY;  Surgeon: Gae Dry, MD;  Location: [facility];  Service: Gynecology;  Laterality: Bilateral;  . TUBAL LIGATION Bilateral 09/14/2015   Procedure: BILATERAL TUBAL LIGATION;  Surgeon: Gae Dry, MD;  Location: [facility];  Service: Gynecology;  Laterality: Bilateral;  . VULVAR LESION REMOVAL N/A 09/14/2015   Procedure: VULVAR LESION;  Surgeon: Gae Dry, MD;  Location: [facility];  Service: Gynecology;  Laterality: N/A;  . WISDOM TOOTH EXTRACTION      There were no vitals filed for this visit.       Subjective Assessment - 05/12/17 1030    Subjective Patients has a 2-3 month history of right hip pain that has increased over the past 3 months. The pain is worse with activiy. She has pain with driving and pain transfering from sit to stand. 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    Pertinent History Cardiac    Limitations Standing;Walking   How long can you sit comfortably? Stiffens when she sits    How long can you stand comfortably? Gets agrevated the longer she stands   How long can you walk comfortably? With community ambualtion    Diagnostic tests X-ray per patient negative    Patient Stated Goals No pain    Currently in Pain? Yes   Pain Score 3   8/10 at worst   Pain Location Hip   Pain Orientation Right   Pain Descriptors / Indicators Aching   Pain Type Acute pain   Pain Onset More than a month ago   Pain Frequency Intermittent  with movement    Aggravating Factors  Movement; standing and walking,    Pain Relieving Factors Pain medication, massage   Effect of Pain on Daily Activities Pain with daily activity    Multiple Pain Sites No  [facility] PT Assessment - 05/12/17 0001      Assessment   Medical Diagnosis Right hip pain    Referring Provider Chana Bode PA-C    Onset Date/Surgical Date --  3 months prior    Hand Dominance Right   Next MD Visit Nothing scheduled    Prior Therapy None      Precautions   Precautions None     Restrictions   Weight Bearing Restrictions No     Balance Screen   Has the patient fallen in the past 6 months No   Has the patient had a decrease in activity level because of a fear of falling?  No   Is the patient reluctant to leave their home because of a fear of falling?  No     Home  Environment   Additional Comments Has a 2 story house. 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    Leisure going to the gym;      Cognition   Overall Cognitive Status Within Functional Limits for tasks assessed   Attention Focused   Focused Attention Appears intact   Memory Appears intact   Awareness Appears intact   Problem Solving Appears intact     Sensation   Light Touch Appears Intact     Coordination   Gross Motor Movements are Fluid and Coordinated Yes   Fine Motor Movements are Fluid and Coordinated Yes     ROM / Strength   AROM / PROM / Strength AROM;PROM;Strength     AROM   Overall AROM Comments left hip pain with hip flexion    AROM Assessment Site Hip;Knee;Ankle   Right/Left Hip Right   Right Hip Flexion 105  pain at end range      PROM   Overall PROM Comments Tighness noted with hip internal and external rotation    PROM Assessment Site Hip;Knee   Right/Left Hip Right     Strength   Strength Assessment Site Hip;Knee;Ankle   Right/Left Hip Right;Left   Right Hip Flexion 3/5   Right Hip ABduction 3+/5   Right Hip ADduction 4+/5   Left Hip Flexion 4/5   Left Hip ABduction 4+/5   Left Hip ADduction 5/5   Right/Left Knee Right   Right Knee Flexion 4+/5   Right Knee Extension 4/5     Flexibility   Soft Tissue Assessment /Muscle Length yes   Hamstrings hamstring 90/90 15 degrees bilateral      Palpation   Palpation comment tenrderness around the trochanter and into anterior hip      Ambulation/Gait   Gait Comments bilateral foot pronation; right toe out improved on the right             Objective measurements completed on examination: See above findings.          [facility] Adult PT Treatment/Exercise - 05/12/17 0001      Knee/Hip Exercises: Stretches   Piriformis Stretch Limitations 2x30sec hold     Other Knee/Hip Stretches lower trunk rotation x10    Other Knee/Hip Stretches Lateral hip roll out x2 minutes with education on pressure      Knee/Hip Exercises: Supine   Straight Leg Raises Limitations 2x10 with cuing for toe out    Other Supine Knee/Hip Exercises supine clam shell yellow 2x10  PT Education - 05/12/17 1443    Education provided Yes   Education Details HEP, symptom mangement; anatomy of her condtion    Person(s) Educated Patient   Methods Explanation;Tactile cues;Demonstration;Verbal cues   Comprehension Verbalized understanding;Returned demonstration;Verbal cues required;Tactile cues required          PT Short Term Goals - 05/12/17 1456      PT SHORT TERM GOAL #1   Title Patient will demsotrate full passive right hip ER/IR    Time 4   Period Weeks   Status New   Target Date 06/09/17     PT SHORT TERM GOAL #2   Title Patient will increase gross right hip strength to 4/5   Time 4   Period Weeks   Status New   Target Date 06/09/17     PT SHORT TERM GOAL #3   Title Patient will be independent with initial HEP    Time 4   Period Weeks   Status New   Target Date 06/09/17           PT Long Term Goals - 05/12/17 1458      PT LONG TERM GOAL #1   Title Patient will ambualte 3000' without increased pain in order to perfrom IADL's    Time 8   Period Weeks   Status New   Target Date 07/07/17     PT LONG TERM GOAL #2   Title Patient will stand for 2 hours without increased pain in order to perfrom work tasks    Time 8   Period Weeks   Status New   Target Date 07/07/17     PT LONG TERM GOAL #3   Title Patient will be independent with strengthening porgram and stretching in order to maintain gains made.    Time 8   Period Weeks   Status New   Target Date 07/07/17                Plan - 05/12/17 1446    Clinical Impression Statement Patient is a 46 year old female with right hip pain. Symptoms and measures are  consitent with daignosis of trochanteric bursitis. She has limited hip ER/IR as well as limited hip flexion strength. She would benefit from skilled therapy in order to improve strength and ability to stand and ambualte.    History and Personal Factors relevant to plan of care: heart arythmias    Clinical Presentation Evolving   Clinical Decision Making Moderate   Rehab Potential Good   PT Frequency 2x / week   PT Duration 8 weeks   PT Treatment/Interventions ADLs/Self Care Home Management;Electrical Stimulation;Cryotherapy;Iontophoresis 49m/ml Dexamethasone;Gait training;Stair training;Therapeutic activities;Therapeutic exercise;Neuromuscular re-education;Patient/family education;Passive range of motion;Dry needling;Splinting;Taping   PT Next Visit Plan consider ionto and ultrasound; add bridging; thomas stretch; Review HEP; Add supine hip flexion;    PT Home Exercise Plan SLR; clam shell; piriformis stretch; LTR; IT band roll out    Consulted and Agree with Plan of Care Patient      Patient will benefit from skilled therapeutic intervention in order to improve the following deficits and impairments:  Abnormal gait, Decreased activity tolerance, Decreased strength, Pain, Decreased range of motion  Visit Diagnosis: Pain in right hip - Plan: PT plan of care cert/re-cert  Stiffness of right hip, not elsewhere classified - Plan: PT plan of care cert/re-cert  Difficulty in walking, not elsewhere classified - Plan: PT plan of care cert/re-cert     Problem List Patient Active Problem List  Diagnosis Date Noted  . Hip pain, right 04/23/2017  . PVC (premature ventricular contraction) 03/05/2017  . Family history of coronary artery disease 02/06/2017  . Blurred vision 02/06/2017  . Elevated blood-pressure reading without diagnosis of hypertension 02/06/2017  . Heart palpitations 02/06/2017  . Headache syndrome 02/06/2017  . Other fatigue 02/06/2017  . Daytime somnolence 02/06/2017  .  Family history of breast cancer 11/25/2016  . Upper airway cough syndrome 09/04/2016  . Dyspnea on exertion 09/04/2016  . Female sterility 09/14/2015  . Menorrhagia with regular cycle 09/14/2015  . Fibroids, submucosal 09/14/2015  . Ovarian cyst, right 09/14/2015  . Vulvar lesion 09/14/2015  . Chronic vaginitis 09/14/2015  . Encounter for health maintenance examination in adult 06/28/2015  . Obesity with serious comorbidity 06/28/2015  . Vitamin D deficiency 06/28/2015  . Anemia, iron deficiency 06/28/2015  . Microscopic hematuria 06/28/2015  . Allergic rhinitis 03/31/2013    Carney Living PTDPT  05/12/2017, 3:04 PM  [facility] [address] [place], [place], [address] Phone: [phone]   Fax:  [phone]  Name: Kristy [name] MRN: [id number] Date of Birth: 06/13/1971

## 2017-05-19 ENCOUNTER — Ambulatory Visit: Payer: BLUE CROSS/BLUE SHIELD | Admitting: Obstetrics and Gynecology

## 2017-05-19 ENCOUNTER — Other Ambulatory Visit: Payer: Self-pay | Admitting: Obstetrics and Gynecology

## 2017-05-19 ENCOUNTER — Telehealth: Payer: Self-pay | Admitting: Physical Therapy

## 2017-05-19 ENCOUNTER — Ambulatory Visit: Payer: BLUE CROSS/BLUE SHIELD | Attending: Medical | Admitting: Physical Therapy

## 2017-05-19 ENCOUNTER — Telehealth: Payer: Self-pay | Admitting: Obstetrics and Gynecology

## 2017-05-19 DIAGNOSIS — R262 Difficulty in walking, not elsewhere classified: Secondary | ICD-10-CM | POA: Insufficient documentation

## 2017-05-19 DIAGNOSIS — M25651 Stiffness of right hip, not elsewhere classified: Secondary | ICD-10-CM | POA: Insufficient documentation

## 2017-05-19 DIAGNOSIS — M25551 Pain in right hip: Secondary | ICD-10-CM | POA: Insufficient documentation

## 2017-05-19 MED ORDER — METRONIDAZOLE 500 MG PO TABS: ORAL_TABLET | ORAL | 0 refills | Status: DC

## 2017-05-19 NOTE — Progress Notes (Signed)
Pt with recurrent BV sx again. Will treat with flagyl. If sx persist, will try clindamycin ORAL. Has done clindamycin crm as maintenance in the past with sx relief. F/u prn.

## 2017-05-19 NOTE — Telephone Encounter (Signed)
Done. Rx flagyl. See note

## 2017-05-19 NOTE — Telephone Encounter (Signed)
Pt is calling wanting to know if Dr. Lorelei Pont will send in prescription for the discharge pt is having. Pt is schedule today 05/19/17 at 4:30 and pt isn't sure she will make it to her schedule appt. Please advise

## 2017-05-19 NOTE — Telephone Encounter (Signed)
Left message regarding no-show appointment day and left reminder of next appointment time. Asked her to call if she cannot attend.

## 2017-05-20 ENCOUNTER — Ambulatory Visit: Payer: BLUE CROSS/BLUE SHIELD | Admitting: Physical Therapy

## 2017-05-20 ENCOUNTER — Encounter: Payer: Self-pay | Admitting: Physical Therapy

## 2017-05-20 DIAGNOSIS — R262 Difficulty in walking, not elsewhere classified: Secondary | ICD-10-CM

## 2017-05-20 DIAGNOSIS — M25651 Stiffness of right hip, not elsewhere classified: Secondary | ICD-10-CM | POA: Diagnosis not present

## 2017-05-20 DIAGNOSIS — M25551 Pain in right hip: Secondary | ICD-10-CM | POA: Diagnosis not present

## 2017-05-20 NOTE — Patient Instructions (Signed)
Hip Flexor Stretch    Lying on back near edge of bed, bend one leg, foot flat. Hang other leg over edge, relaxed, thigh resting entirely on bed for  1____ minutes. Repeat _1___ times. Do __2__ sessions per day. Advanced Exercise: Bend knee back keeping thigh in contact with bed.  Bridge    Lie back, legs bent. Inhale, pressing hips up. Keeping ribs in, lengthen lower back. Exhale, rolling down along spine from top. Repeat ___10-20 _ times. Do __2__ sessions per day.

## 2017-05-20 NOTE — Therapy (Signed)
[place], [place], [address] Phone: [phone]   Fax:  [phone]  Physical Therapy Treatment  Patient Details  Name: Kristy [name] MRN: [id number] Date of Birth: Dec 03, 1970 Referring Provider: Chana Bode PA-C    Encounter Date: 05/20/2017  PT End of Session - 05/20/17 1026    Visit Number  2    Number of Visits  16    Date for PT Re-Evaluation  07/07/17    Authorization Type  BCBS     PT Start Time  1020    PT Stop Time  1058    PT Time Calculation (min)  38 min       Past Medical History:  Diagnosis Date  . BRCA negative 01/2013  . Dysrhythmia    a. 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 H. pylori infection 10/2010  . Iron deficiency anemia, unspecified   . Obesity   . Seasonal allergies    ragweed, tree pollen, mild and dust per testing 08/2012  . Unspecified vitamin D deficiency     Past Surgical History:  Procedure Laterality Date  . CESAREAN SECTION    . WISDOM TOOTH EXTRACTION      There were no vitals filed for this visit.  Subjective Assessment - 05/20/17 1024    Subjective  Just a little irritated.     Currently in Pain?  Yes    Pain Score  5     Pain Location  Hip    Pain Orientation  Right    Pain Descriptors / Indicators  Discomfort;Tightness;Tender                      [facility] Adult PT Treatment/Exercise - 05/20/17 0001      Knee/Hip Exercises: Stretches   Active Hamstring Stretch  2 reps;30 seconds    Hip Flexor Stretch  2 reps;30 seconds    ITB Stretch  2 reps;30 seconds    Piriformis Stretch Limitations  2x30sec hold     Other Knee/Hip Stretches  lower trunk rotation x10       Knee/Hip Exercises: Supine   Bridges  10 reps    Straight Leg Raises Limitations  2x10 with cuing for toe out     Other Supine Knee/Hip Exercises  supine clam shell yellow 2x10       Knee/Hip Exercises: Sidelying   Clams  x 15       Modalities   Modalities   Iontophoresis      Iontophoresis   Type of Iontophoresis  Dexamethasone    Location  Right hip     Dose  1 ml    Time  6 hour wear time       Manual Therapy   Manual therapy comments  massage roller to Right ITB in side lying              PT Education - 05/20/17 1052    Education provided  Yes    Education Details  Brige, hip flexor stretch    Person(s) Educated  Patient    Methods  Explanation;Handout    Comprehension  Verbalized understanding       PT Short Term Goals - 05/12/17 1456      PT SHORT TERM GOAL #1   Title  Patient will demsotrate full passive right hip ER/IR     Time  4    Period  Weeks    Status  New    Target  Date  06/09/17      PT SHORT TERM GOAL #2   Title  Patient will increase gross right hip strength to 4/5    Time  4    Period  Weeks    Status  New    Target Date  06/09/17      PT SHORT TERM GOAL #3   Title  Patient will be independent with initial HEP     Time  4    Period  Weeks    Status  New    Target Date  06/09/17        PT Long Term Goals - 05/12/17 1458      PT LONG TERM GOAL #1   Title  Patient will ambualte 3000' without increased pain in order to perfrom IADL's     Time  8    Period  Weeks    Status  New    Target Date  07/07/17      PT LONG TERM GOAL #2   Title  Patient will stand for 2 hours without increased pain in order to perfrom work tasks     Time  8    Period  Weeks    Status  New    Target Date  07/07/17      PT LONG TERM GOAL #3   Title  Patient will be independent with strengthening porgram and stretching in order to maintain gains made.     Time  8    Period  Weeks    Status  New    Target Date  07/07/17            Plan - 05/20/17 1049    Clinical Impression Statement  Pt reports being busy last week and did not complete much HEP. Progressed exercises and tried ionto patch to hip  today. Pt points to right lower back as an area of pain as well when applying ionto patch. Will assess  benefit next visit. She reports feeling less pain at end of treatment. Updated HEP.     PT Next Visit Plan  assess tolerance to ionto; consider US/tape;  Review HEP; Add supine hip flexion;     PT Home Exercise Plan  SLR; clam shell; piriformis stretch; LTR; IT band roll out , bridge, hip flexor stretch     Consulted and Agree with Plan of Care  Patient       Patient will benefit from skilled therapeutic intervention in order to improve the following deficits and impairments:  Abnormal gait, Decreased activity tolerance, Decreased strength, Pain, Decreased range of motion  Visit Diagnosis: Pain in right hip  Stiffness of right hip, not elsewhere classified  Difficulty in walking, not elsewhere classified     Problem List Patient Active Problem List   Diagnosis Date Noted  . Hip pain, right 04/23/2017  . PVC (premature ventricular contraction) 03/05/2017  . Family history of coronary artery disease 02/06/2017  . Blurred vision 02/06/2017  . Elevated blood-pressure reading without diagnosis of hypertension 02/06/2017  . Heart palpitations 02/06/2017  . Headache syndrome 02/06/2017  . Other fatigue 02/06/2017  . Daytime somnolence 02/06/2017  . Family history of breast cancer 11/25/2016  . Upper airway cough syndrome 09/04/2016  . Dyspnea on exertion 09/04/2016  . Female sterility 09/14/2015  . Menorrhagia with regular cycle 09/14/2015  . Fibroids, submucosal 09/14/2015  . Ovarian cyst, right 09/14/2015  . Vulvar lesion 09/14/2015  . Chronic vaginitis 09/14/2015  . Encounter for health maintenance  examination in adult 06/28/2015  . Obesity with serious comorbidity 06/28/2015  . Vitamin D deficiency 06/28/2015  . Anemia, iron deficiency 06/28/2015  . Microscopic hematuria 06/28/2015  . Allergic rhinitis 03/31/2013    Dorene Ar, PTA 05/20/2017, 11:09 AM  [place], [place],  [address] Phone: [phone]   Fax:  [phone]  Name: Kristy [name] MRN: [id number] Date of Birth: [birth year]

## 2017-05-26 ENCOUNTER — Ambulatory Visit: Payer: BLUE CROSS/BLUE SHIELD | Admitting: Physical Therapy

## 2017-05-26 ENCOUNTER — Encounter: Payer: Self-pay | Admitting: Physical Therapy

## 2017-05-26 DIAGNOSIS — M25551 Pain in right hip: Secondary | ICD-10-CM | POA: Diagnosis not present

## 2017-05-26 DIAGNOSIS — R262 Difficulty in walking, not elsewhere classified: Secondary | ICD-10-CM

## 2017-05-26 DIAGNOSIS — M25651 Stiffness of right hip, not elsewhere classified: Secondary | ICD-10-CM

## 2017-05-26 NOTE — Therapy (Signed)
[place], [place], [address] Phone: [phone]   Fax:  [phone]  Physical Therapy Treatment  Patient Details  Name: Kristy [name] MRN: [id number] Date of Birth: 1971-02-03 Referring Provider: Chana Bode PA-C    Encounter Date: 05/26/2017  PT End of Session - 05/26/17 0858    Visit Number  3    Number of Visits  16    Date for PT Re-Evaluation  07/07/17    Authorization Type  BCBS     PT Start Time  0855 10 min late     PT Stop Time  0934    PT Time Calculation (min)  39 min       Past Medical History:  Diagnosis Date  . BRCA negative 01/2013  . Dysrhythmia    a. 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 H. pylori infection 10/2010  . Iron deficiency anemia, unspecified   . Obesity   . Seasonal allergies    ragweed, tree pollen, mild and dust per testing 08/2012  . Unspecified vitamin D deficiency     Past Surgical History:  Procedure Laterality Date  . CESAREAN SECTION    . WISDOM TOOTH EXTRACTION      There were no vitals filed for this visit.  Subjective Assessment - 05/26/17 0857    Subjective  No pain now. I have it first thing in the morning.     Currently in Pain?  No/denies                      [facility] Adult PT Treatment/Exercise - 05/26/17 0001      Knee/Hip Exercises: Stretches   Active Hamstring Stretch  2 reps;30 seconds    Hip Flexor Stretch  2 reps;30 seconds with opp knee flexion    ITB Stretch  2 reps;30 seconds and figure 4     Piriformis Stretch Limitations  2x30sec hold     Other Knee/Hip Stretches  --      Knee/Hip Exercises: Supine   Bridges  10 reps    Bridges with Clamshell  10 reps red    Straight Leg Raises Limitations  2x10 with cuing for toe out     Other Supine Knee/Hip Exercises  supine clam shell red 1x10       Knee/Hip Exercises: Sidelying   Hip ABduction  10 reps;1 set;Both    Clams  x 15  both      Iontophoresis   Type  of Iontophoresis  Dexamethasone    Location  Right hip     Dose  1 ml    Time  6 hour wear time       Manual Therapy   Manual therapy comments  --             PT Education - 05/26/17 0934    Education provided  Yes    Education Details  HEP    Person(s) Educated  Patient    Methods  Explanation;Handout    Comprehension  Verbalized understanding       PT Short Term Goals - 05/12/17 1456      PT SHORT TERM GOAL #1   Title  Patient will demsotrate full passive right hip ER/IR     Time  4    Period  Weeks    Status  New    Target Date  06/09/17      PT SHORT TERM GOAL #2   Title  Patient  will increase gross right hip strength to 4/5    Time  4    Period  Weeks    Status  New    Target Date  06/09/17      PT SHORT TERM GOAL #3   Title  Patient will be independent with initial HEP     Time  4    Period  Weeks    Status  New    Target Date  06/09/17        PT Long Term Goals - 05/12/17 1458      PT LONG TERM GOAL #1   Title  Patient will ambualte 3000' without increased pain in order to perfrom IADL's     Time  8    Period  Weeks    Status  New    Target Date  07/07/17      PT LONG TERM GOAL #2   Title  Patient will stand for 2 hours without increased pain in order to perfrom work tasks     Time  8    Period  Weeks    Status  New    Target Date  07/07/17      PT LONG TERM GOAL #3   Title  Patient will be independent with strengthening porgram and stretching in order to maintain gains made.     Time  8    Period  Weeks    Status  New    Target Date  07/07/17            Plan - 05/26/17 0858    Clinical Impression Statement  Pt reports trying exercises twice in the last week. She has noticed a slight improvement in pain, thinks Ionto patch was helpful. She is limtied by morning stiffness and stiffness after prolonged sitting. Updated HEP and discussed benefit of HEP compliance. Repeated ionto to lateral hip.     PT Next Visit Plan  assess  tolerance to ionto; consider US/tape;  Review HEP; Add supine hip flexion;     PT Home Exercise Plan  SLR; clam shell; piriformis stretch, figure 4 ; LTR; IT band roll out , bridge, hip flexor stretch , side hip abduction    Consulted and Agree with Plan of Care  Patient       Patient will benefit from skilled therapeutic intervention in order to improve the following deficits and impairments:  Abnormal gait, Decreased activity tolerance, Decreased strength, Pain, Decreased range of motion  Visit Diagnosis: Pain in right hip  Stiffness of right hip, not elsewhere classified  Difficulty in walking, not elsewhere classified     Problem List Patient Active Problem List   Diagnosis Date Noted  . Hip pain, right 04/23/2017  . PVC (premature ventricular contraction) 03/05/2017  . Family history of coronary artery disease 02/06/2017  . Blurred vision 02/06/2017  . Elevated blood-pressure reading without diagnosis of hypertension 02/06/2017  . Heart palpitations 02/06/2017  . Headache syndrome 02/06/2017  . Other fatigue 02/06/2017  . Daytime somnolence 02/06/2017  . Family history of breast cancer 11/25/2016  . Upper airway cough syndrome 09/04/2016  . Dyspnea on exertion 09/04/2016  . Female sterility 09/14/2015  . Menorrhagia with regular cycle 09/14/2015  . Fibroids, submucosal 09/14/2015  . Ovarian cyst, right 09/14/2015  . Vulvar lesion 09/14/2015  . Chronic vaginitis 09/14/2015  . Encounter for health maintenance examination in adult 06/28/2015  . Obesity with serious comorbidity 06/28/2015  . Vitamin D deficiency 06/28/2015  . Anemia, iron  deficiency 06/28/2015  . Microscopic hematuria 06/28/2015  . Allergic rhinitis 03/31/2013    Dorene Ar, PTA 05/26/2017, 9:44 AM  [place], [place], [address] Phone: [phone]   Fax:  [phone]  Name: Kristy [name] MRN:  [id number] Date of Birth: 1970/12/14

## 2017-05-28 ENCOUNTER — Ambulatory Visit: Payer: BLUE CROSS/BLUE SHIELD | Admitting: Physical Therapy

## 2017-05-28 DIAGNOSIS — M25551 Pain in right hip: Secondary | ICD-10-CM | POA: Diagnosis not present

## 2017-05-28 DIAGNOSIS — R262 Difficulty in walking, not elsewhere classified: Secondary | ICD-10-CM | POA: Diagnosis not present

## 2017-05-28 DIAGNOSIS — M25651 Stiffness of right hip, not elsewhere classified: Secondary | ICD-10-CM

## 2017-05-29 NOTE — Therapy (Signed)
[place], [place], [address] Phone: [phone]   Fax:  [phone]  Physical Therapy Treatment  Patient Details  Name: Jacklyn Branan MRN: [id number] Date of Birth: [birth year] Referring Provider: Chana Bode PA-C    Encounter Date: 05/28/2017  PT End of Session - 05/29/17 1458    Visit Number  4    Number of Visits  16    Date for PT Re-Evaluation  07/07/17    Authorization Type  BCBS     PT Start Time  1415    PT Stop Time  1459    PT Time Calculation (min)  44 min    Activity Tolerance  Patient tolerated treatment well    Behavior During Therapy  [facility] for tasks assessed/performed       Past Medical History:  Diagnosis Date  . BRCA negative 01/2013  . Dysrhythmia    a. 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 H. pylori infection 10/2010  . Iron deficiency anemia, unspecified   . Obesity   . Seasonal allergies    ragweed, tree pollen, mild and dust per testing 08/2012  . Unspecified vitamin D deficiency     Past Surgical History:  Procedure Laterality Date  . CESAREAN SECTION    . [place] N/A 09/14/2015   Procedure: DILATATION & CURETTAGE/HYSTEROSCOPY WITH NOVASURE ABLATION;  Surgeon: Gae Dry, MD;  Location: [facility] ORS;  Service: Gynecology;  Laterality: N/A;  . IUD REMOVAL N/A 09/14/2015   Procedure: INTRAUTERINE DEVICE (IUD) REMOVAL;  Surgeon: Gae Dry, MD;  Location: [facility] ORS;  Service: Gynecology;  Laterality: N/A;  . LAPAROSCOPIC BILATERAL SALPINGECTOMY Bilateral 09/14/2015   Procedure: LAPAROSCOPIC BILATERAL SALPINGECTOMY;  Surgeon: Gae Dry, MD;  Location: [facility] ORS;  Service: Gynecology;  Laterality: Bilateral;  . TUBAL LIGATION Bilateral 09/14/2015   Procedure: BILATERAL TUBAL LIGATION;  Surgeon: Gae Dry, MD;  Location: [facility] ORS;  Service: Gynecology;  Laterality: Bilateral;  . VULVAR LESION REMOVAL N/A  09/14/2015   Procedure: VULVAR LESION;  Surgeon: Gae Dry, MD;  Location: [facility] ORS;  Service: Gynecology;  Laterality: N/A;  . WISDOM TOOTH EXTRACTION      There were no vitals filed for this visit.  Subjective Assessment - 05/29/17 1455    Subjective  Patient reports her hip ahs been much better. She feels like the patch is helping. She still feels minor pain in the morning.     Pertinent History  Cardiac     Limitations  Standing;Walking    How long can you sit comfortably?  Stiffens when she sits     How long can you stand comfortably?  Gets agrevated the longer she stands    How long can you walk comfortably?  With community ambualtion     Diagnostic tests  X-ray per patient negative     Patient Stated Goals  No pain     Currently in Pain?  No/denies                      [facility] Adult PT Treatment/Exercise - 05/29/17 0001      Knee/Hip Exercises: Stretches   Active Hamstring Stretch  2 reps;30 seconds    Hip Flexor Stretch  2 reps;30 seconds with opp knee flexion    ITB Stretch  2 reps;30 seconds and figure 4     Piriformis Stretch Limitations  2x30sec hold     Other Knee/Hip Stretches  lower trunk rotation x10       Knee/Hip Exercises: Aerobic   Nustep  5 min L 5       Knee/Hip Exercises: Supine   Bridges  10 reps    Bridges with Clamshell  10 reps red    Straight Leg Raises Limitations  2x10 with cuing for toe out     Other Supine Knee/Hip Exercises  supine clam shell green 2x10       Knee/Hip Exercises: Sidelying   Hip ABduction  10 reps;1 set;Both      Iontophoresis   Type of Iontophoresis  Dexamethasone    Location  Right hip     Dose  1 ml    Time  6 hour wear time       Manual Therapy   Manual therapy comments  massage roller to Right ITB in side lying              PT Education - 05/29/17 1457    Education provided  Yes    Education Details  reviewed HEP; symptom mangement     Person(s) Educated  Patient    Methods   Explanation;Handout    Comprehension  Verbalized understanding;Returned demonstration;Verbal cues required       PT Short Term Goals - 05/29/17 1501      PT SHORT TERM GOAL #1   Title  Patient will demsotrate full passive right hip ER/IR     Baseline  not assessed     Time  4    Period  Weeks    Status  On-going      PT SHORT TERM GOAL #2   Title  Patient will increase gross right hip strength to 4/5    Time  4    Period  Weeks    Status  On-going      PT SHORT TERM GOAL #3   Title  Patient will be independent with initial HEP     Time  4    Period  Weeks    Status  On-going        PT Long Term Goals - 05/12/17 1458      PT LONG TERM GOAL #1   Title  Patient will ambualte 3000' without increased pain in order to perfrom IADL's     Time  8    Period  Weeks    Status  New    Target Date  07/07/17      PT LONG TERM GOAL #2   Title  Patient will stand for 2 hours without increased pain in order to perfrom work tasks     Time  8    Period  Weeks    Status  New    Target Date  07/07/17      PT LONG TERM GOAL #3   Title  Patient will be independent with strengthening porgram and stretching in order to maintain gains made.     Time  8    Period  Weeks    Status  New    Target Date  07/07/17            Plan - 05/29/17 1459    Clinical Impression Statement  Patient still has minor tenderness to palpation into glut and troachnater area. She tolerated exercises well. She had no pain. She was encouraged to continue with ehr exercises at home over the weekend.     Clinical Presentation  Evolving    Clinical Decision Making  Moderate  Rehab Potential  Good    PT Frequency  2x / week    PT Duration  8 weeks    PT Treatment/Interventions  ADLs/Self Care Home Management;Electrical Stimulation;Cryotherapy;Iontophoresis 4m/ml Dexamethasone;Gait training;Stair training;Therapeutic activities;Therapeutic exercise;Neuromuscular re-education;Patient/family  education;Passive range of motion;Dry needling;Splinting;Taping    PT Next Visit Plan  assess tolerance to ionto; consider US/tape;  Review HEP; Add supine hip flexion;     PT Home Exercise Plan  SLR; clam shell; piriformis stretch, figure 4 ; LTR; IT band roll out , bridge, hip flexor stretch , side hip abduction    Consulted and Agree with Plan of Care  Patient       Patient will benefit from skilled therapeutic intervention in order to improve the following deficits and impairments:  Abnormal gait, Decreased activity tolerance, Decreased strength, Pain, Decreased range of motion  Visit Diagnosis: Pain in right hip  Stiffness of right hip, not elsewhere classified  Difficulty in walking, not elsewhere classified     Problem List Patient Active Problem List   Diagnosis Date Noted  . Hip pain, right 04/23/2017  . PVC (premature ventricular contraction) 03/05/2017  . Family history of coronary artery disease 02/06/2017  . Blurred vision 02/06/2017  . Elevated blood-pressure reading without diagnosis of hypertension 02/06/2017  . Heart palpitations 02/06/2017  . Headache syndrome 02/06/2017  . Other fatigue 02/06/2017  . Daytime somnolence 02/06/2017  . Family history of breast cancer 11/25/2016  . Upper airway cough syndrome 09/04/2016  . Dyspnea on exertion 09/04/2016  . Female sterility 09/14/2015  . Menorrhagia with regular cycle 09/14/2015  . Fibroids, submucosal 09/14/2015  . Ovarian cyst, right 09/14/2015  . Vulvar lesion 09/14/2015  . Chronic vaginitis 09/14/2015  . Encounter for health maintenance examination in adult 06/28/2015  . Obesity with serious comorbidity 06/28/2015  . Vitamin D deficiency 06/28/2015  . Anemia, iron deficiency 06/28/2015  . Microscopic hematuria 06/28/2015  . Allergic rhinitis 03/31/2013    DCarney LivingPT DPT  05/29/2017, 3:02 PM  [facility]  [address]Phone: [phone]  Fax:  [phone] Name: CAnalicia SkibinskiMRN: [id number] of Birth: 211/23/1972

## 2017-06-02 ENCOUNTER — Ambulatory Visit: Payer: BLUE CROSS/BLUE SHIELD | Admitting: Physical Therapy

## 2017-06-02 ENCOUNTER — Encounter: Payer: Self-pay | Admitting: Physical Therapy

## 2017-06-02 DIAGNOSIS — M25651 Stiffness of right hip, not elsewhere classified: Secondary | ICD-10-CM

## 2017-06-02 DIAGNOSIS — M25551 Pain in right hip: Secondary | ICD-10-CM | POA: Diagnosis not present

## 2017-06-02 DIAGNOSIS — R262 Difficulty in walking, not elsewhere classified: Secondary | ICD-10-CM

## 2017-06-02 NOTE — Therapy (Signed)
[place], [place], [address] Phone: [phone]   Fax:  [phone]  Physical Therapy Treatment  Patient Details  Name: Kristy [name] MRN: [id number] Date of Birth: [birth year] Referring Provider: Chana Bode PA-C    Encounter Date: 06/02/2017  PT End of Session - 06/02/17 0851    Visit Number  5    Number of Visits  16    Date for PT Re-Evaluation  07/07/17    Authorization Type  BCBS     PT Start Time  2440    PT Stop Time  0925    PT Time Calculation (min)  38 min       Past Medical History:  Diagnosis Date  . BRCA negative 01/2013  . Dysrhythmia    a. 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 H. pylori infection 10/2010  . Iron deficiency anemia, unspecified   . Obesity   . Seasonal allergies    ragweed, tree pollen, mild and dust per testing 08/2012  . Unspecified vitamin D deficiency     Past Surgical History:  Procedure Laterality Date  . BILATERAL TUBAL LIGATION Bilateral 09/14/2015   Performed by Gae Dry, MD at [facility] ORS  . CESAREAN SECTION    . DILATATION & CURETTAGE/HYSTEROSCOPY WITH NOVASURE ABLATION N/A 09/14/2015   Performed by Gae Dry, MD at [facility] ORS  . INTRAUTERINE DEVICE (IUD) REMOVAL N/A 09/14/2015   Performed by Gae Dry, MD at [facility] ORS  . LAPAROSCOPIC BILATERAL SALPINGECTOMY Bilateral 09/14/2015   Performed by Gae Dry, MD at [facility] ORS  . VULVAR LESION N/A 09/14/2015   Performed by Gae Dry, MD at [facility] ORS  . WISDOM TOOTH EXTRACTION      There were no vitals filed for this visit.  Subjective Assessment - 06/02/17 0850    Subjective  I feel tight this morning.     Currently in Pain?  Yes    Pain Score  5     Pain Orientation  Right    Pain Descriptors / Indicators  Tightness    Aggravating Factors   getting out of the car, wearing heels     Pain Relieving Factors  pain meds and massage          [facility] PT Assessment - 06/02/17  0001      AROM   Right Hip Flexion  115      Strength   Right Hip Flexion  4-/5    Right Hip ABduction  4-/5    Left Hip ABduction  4/5                  [facility] Adult PT Treatment/Exercise - 06/02/17 0001      Knee/Hip Exercises: Stretches   Active Hamstring Stretch  2 reps;30 seconds    Hip Flexor Stretch  2 reps;30 seconds with opp knee flexion    ITB Stretch  2 reps;30 seconds and figure 4     Piriformis Stretch Limitations  2x30sec hold     Other Knee/Hip Stretches  lower trunk rotation x10       Knee/Hip Exercises: Aerobic   Nustep  5 min L 5       Knee/Hip Exercises: Supine   Bridges  10 reps    Bridges with Clamshell  10 reps green    Single Leg Bridge  5 reps;3 sets each    Straight Leg Raises Limitations  x 15 each  Other Supine Knee/Hip Exercises  supine clam shell green 2x10       Knee/Hip Exercises: Sidelying   Hip ABduction  10 reps;1 set;Both    Clams  x10 both  yellow                PT Short Term Goals - 06/02/17 0916      PT SHORT TERM GOAL #1   Title  Patient will demsotrate full passive right hip ER/IR     Baseline  full, some pulling felt with Right IR     Time  4    Period  Weeks    Status  Partially Met      PT SHORT TERM GOAL #2   Title  Patient will increase gross right hip strength to 4/5    Baseline  4-/5 hip abduction    Time  4    Period  Weeks    Status  On-going      PT SHORT TERM GOAL #3   Title  Patient will be independent with initial HEP     Baseline  reports mostly only stretching     Time  4    Period  Weeks    Status  On-going        PT Long Term Goals - 05/12/17 1458      PT LONG TERM GOAL #1   Title  Patient will ambualte 3000' without increased pain in order to perfrom IADL's     Time  8    Period  Weeks    Status  New    Target Date  07/07/17      PT LONG TERM GOAL #2   Title  Patient will stand for 2 hours without increased pain in order to perfrom work tasks     Time  8    Period   Weeks    Status  New    Target Date  07/07/17      PT LONG TERM GOAL #3   Title  Patient will be independent with strengthening porgram and stretching in order to maintain gains made.     Time  8    Period  Weeks    Status  New    Target Date  07/07/17            Plan - 06/02/17 0904    Clinical Impression Statement  Pt reports pain this morning after wearing heels yesterday and also bending over to wash her dog who was difficult to contain. Improved right hip flexion and abduction. Can still benefit from strengthening. Pt admits to mostly stretching only for HEP. Discussed the need for strengthening to prevent pain return. Progressing toward STGs.     PT Next Visit Plan  focus hip/core strength, hip flexibility ; needs to make appointments     PT Home Exercise Plan  SLR; clam shell; piriformis stretch, figure 4 ; LTR; IT band roll out , bridge, hip flexor stretch , side hip abduction    Consulted and Agree with Plan of Care  Patient       Patient will benefit from skilled therapeutic intervention in order to improve the following deficits and impairments:  Abnormal gait, Decreased activity tolerance, Decreased strength, Pain, Decreased range of motion  Visit Diagnosis: Pain in right hip  Stiffness of right hip, not elsewhere classified  Difficulty in walking, not elsewhere classified     Problem List Patient Active Problem List   Diagnosis Date Noted  .  Hip pain, right 04/23/2017  . PVC (premature ventricular contraction) 03/05/2017  . Family history of coronary artery disease 02/06/2017  . Blurred vision 02/06/2017  . Elevated blood-pressure reading without diagnosis of hypertension 02/06/2017  . Heart palpitations 02/06/2017  . Headache syndrome 02/06/2017  . Other fatigue 02/06/2017  . Daytime somnolence 02/06/2017  . Family history of breast cancer 11/25/2016  . Upper airway cough syndrome 09/04/2016  . Dyspnea on exertion 09/04/2016  . Female sterility  09/14/2015  . Menorrhagia with regular cycle 09/14/2015  . Fibroids, submucosal 09/14/2015  . Ovarian cyst, right 09/14/2015  . Vulvar lesion 09/14/2015  . Chronic vaginitis 09/14/2015  . Encounter for health maintenance examination in adult 06/28/2015  . Obesity with serious comorbidity 06/28/2015  . Vitamin D deficiency 06/28/2015  . Anemia, iron deficiency 06/28/2015  . Microscopic hematuria 06/28/2015  . Allergic rhinitis 03/31/2013    Dorene Ar, PTA 06/02/2017, 9:27 AM  [place], [place], [address] Phone: [phone]   Fax:  [phone]  Name: Kristy [name] MRN: [id number] Date of Birth: May 14, 1971

## 2017-06-03 ENCOUNTER — Ambulatory Visit: Payer: BLUE CROSS/BLUE SHIELD | Admitting: Physical Therapy

## 2017-06-03 ENCOUNTER — Encounter: Payer: Self-pay | Admitting: Physical Therapy

## 2017-06-03 DIAGNOSIS — R262 Difficulty in walking, not elsewhere classified: Secondary | ICD-10-CM | POA: Diagnosis not present

## 2017-06-03 DIAGNOSIS — M25551 Pain in right hip: Secondary | ICD-10-CM | POA: Diagnosis not present

## 2017-06-03 DIAGNOSIS — M25651 Stiffness of right hip, not elsewhere classified: Secondary | ICD-10-CM | POA: Diagnosis not present

## 2017-06-04 NOTE — Therapy (Signed)
[place], [place], [address] Phone: [phone]   Fax:  [phone]  Physical Therapy Treatment  Patient Details  Name: Kristy [name] MRN: [id number] Date of Birth: [birth year] Referring Provider: Chana Bode PA-C    Encounter Date: 06/03/2017  PT End of Session - 06/03/17 1443    Visit Number  6    Number of Visits  16    Date for PT Re-Evaluation  07/07/17    Authorization Type  BCBS     PT Start Time  1515    PT Stop Time  1556    PT Time Calculation (min)  41 min    Activity Tolerance  Patient tolerated treatment well    Behavior During Therapy  [facility] for tasks assessed/performed       Past Medical History:  Diagnosis Date  . BRCA negative 01/2013  . Dysrhythmia    a. 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 H. pylori infection 10/2010  . Iron deficiency anemia, unspecified   . Obesity   . Seasonal allergies    ragweed, tree pollen, mild and dust per testing 08/2012  . Unspecified vitamin D deficiency     Past Surgical History:  Procedure Laterality Date  . CESAREAN SECTION    . [place] N/A 09/14/2015   Procedure: DILATATION & CURETTAGE/HYSTEROSCOPY WITH NOVASURE ABLATION;  Surgeon: Gae Dry, MD;  Location: [facility] ORS;  Service: Gynecology;  Laterality: N/A;  . IUD REMOVAL N/A 09/14/2015   Procedure: INTRAUTERINE DEVICE (IUD) REMOVAL;  Surgeon: Gae Dry, MD;  Location: [facility] ORS;  Service: Gynecology;  Laterality: N/A;  . LAPAROSCOPIC BILATERAL SALPINGECTOMY Bilateral 09/14/2015   Procedure: LAPAROSCOPIC BILATERAL SALPINGECTOMY;  Surgeon: Gae Dry, MD;  Location: [facility] ORS;  Service: Gynecology;  Laterality: Bilateral;  . TUBAL LIGATION Bilateral 09/14/2015   Procedure: BILATERAL TUBAL LIGATION;  Surgeon: Gae Dry, MD;  Location: [facility] ORS;  Service: Gynecology;  Laterality: Bilateral;  . VULVAR LESION REMOVAL N/A  09/14/2015   Procedure: VULVAR LESION;  Surgeon: Gae Dry, MD;  Location: [facility] ORS;  Service: Gynecology;  Laterality: N/A;  . WISDOM TOOTH EXTRACTION      There were no vitals filed for this visit.  Subjective Assessment - 06/03/17 1432    Subjective  Patient continues to have some tightness whenshe sits for a while and in the mornings. She was able to stand at work. She flet some pain getting in and out of the car.     Pertinent History  Cardiac     Limitations  Standing;Walking    How long can you sit comfortably?  Stiffens when she sits     How long can you stand comfortably?  Gets agrevated the longer she stands    How long can you walk comfortably?  With community ambualtion     Diagnostic tests  X-ray per patient negative     Patient Stated Goals  No pain     Currently in Pain?  Yes    Pain Score  5     Pain Location  Hip    Pain Orientation  Right    Pain Descriptors / Indicators  Tightness    Pain Type  Acute pain    Pain Onset  More than a month ago    Pain Frequency  Intermittent    Aggravating Factors   getting in out of the car    Pain Relieving Factors  pain meds  and massage     Effect of Pain on Daily Activities  pain with daily activity                       [facility] Adult PT Treatment/Exercise - 06/04/17 0001      Knee/Hip Exercises: Stretches   Active Hamstring Stretch  2 reps;30 seconds    Hip Flexor Stretch  2 reps;30 seconds with opp knee flexion    ITB Stretch  2 reps;30 seconds and figure 4     Piriformis Stretch Limitations  2x30sec hold     Other Knee/Hip Stretches  lower trunk rotation x10       Knee/Hip Exercises: Aerobic   Nustep  5 min L 5       Knee/Hip Exercises: Supine   Bridges  10 reps    Bridges with Clamshell  10 reps green    Single Leg Bridge  5 reps;3 sets each    Straight Leg Raises Limitations  x 15 each     Other Supine Knee/Hip Exercises  supine clam shell green 2x10       Knee/Hip Exercises: Sidelying   Hip  ABduction  10 reps;1 set;Both    Clams  x10 both  yellow              PT Education - 06/04/17 1004    Education provided  Yes    Education Details  improtance of strengthening     Person(s) Educated  Patient    Methods  Explanation;Demonstration    Comprehension  Verbalized understanding;Returned demonstration;Verbal cues required       PT Short Term Goals - 06/02/17 0916      PT SHORT TERM GOAL #1   Title  Patient will demsotrate full passive right hip ER/IR     Baseline  full, some pulling felt with Right IR     Time  4    Period  Weeks    Status  Partially Met      PT SHORT TERM GOAL #2   Title  Patient will increase gross right hip strength to 4/5    Baseline  4-/5 hip abduction    Time  4    Period  Weeks    Status  On-going      PT SHORT TERM GOAL #3   Title  Patient will be independent with initial HEP     Baseline  reports mostly only stretching     Time  4    Period  Weeks    Status  On-going        PT Long Term Goals - 05/12/17 1458      PT LONG TERM GOAL #1   Title  Patient will ambualte 3000' without increased pain in order to perfrom IADL's     Time  8    Period  Weeks    Status  New    Target Date  07/07/17      PT LONG TERM GOAL #2   Title  Patient will stand for 2 hours without increased pain in order to perfrom work tasks     Time  8    Period  Weeks    Status  New    Target Date  07/07/17      PT LONG TERM GOAL #3   Title  Patient will be independent with strengthening porgram and stretching in order to maintain gains made.     Time  8  Period  Weeks    Status  New    Target Date  07/07/17            Plan - 06/03/17 1443    Clinical Impression Statement  Patient encouraged to continue with her exercises. She had some soreness in the front and side of her hip today. That was likjely muscle soreness from yesterday. No increase in pain noted. Patient was 15 min late to her appointment     Clinical Presentation  Evolving     Rehab Potential  Good    PT Frequency  2x / week    PT Duration  8 weeks    PT Treatment/Interventions  ADLs/Self Care Home Management;Electrical Stimulation;Cryotherapy;Iontophoresis 39m/ml Dexamethasone;Gait training;Stair training;Therapeutic activities;Therapeutic exercise;Neuromuscular re-education;Patient/family education;Passive range of motion;Dry needling;Splinting;Taping    PT Next Visit Plan  focus hip/core strength, hip flexibility ; needs to make appointments     PT Home Exercise Plan  SLR; clam shell; piriformis stretch, figure 4 ; LTR; IT band roll out , bridge, hip flexor stretch , side hip abduction    Consulted and Agree with Plan of Care  Patient       Patient will benefit from skilled therapeutic intervention in order to improve the following deficits and impairments:  Abnormal gait, Decreased activity tolerance, Decreased strength, Pain, Decreased range of motion  Visit Diagnosis: Pain in right hip  Stiffness of right hip, not elsewhere classified  Difficulty in walking, not elsewhere classified     Problem List Patient Active Problem List   Diagnosis Date Noted  . Hip pain, right 04/23/2017  . PVC (premature ventricular contraction) 03/05/2017  . Family history of coronary artery disease 02/06/2017  . Blurred vision 02/06/2017  . Elevated blood-pressure reading without diagnosis of hypertension 02/06/2017  . Heart palpitations 02/06/2017  . Headache syndrome 02/06/2017  . Other fatigue 02/06/2017  . Daytime somnolence 02/06/2017  . Family history of breast cancer 11/25/2016  . Upper airway cough syndrome 09/04/2016  . Dyspnea on exertion 09/04/2016  . Female sterility 09/14/2015  . Menorrhagia with regular cycle 09/14/2015  . Fibroids, submucosal 09/14/2015  . Ovarian cyst, right 09/14/2015  . Vulvar lesion 09/14/2015  . Chronic vaginitis 09/14/2015  . Encounter for health maintenance examination in adult 06/28/2015  . Obesity with serious  comorbidity 06/28/2015  . Vitamin D deficiency 06/28/2015  . Anemia, iron deficiency 06/28/2015  . Microscopic hematuria 06/28/2015  . Allergic rhinitis 03/31/2013    DCarney Living PT DPT  06/04/2017, 10:17 AM  [facility] [address]Phone: [phone]  Fax:  [phone] Name: CCarolie McilrathMRN: [id number] of Birth: 225-Feb-1972

## 2017-06-11 ENCOUNTER — Ambulatory Visit: Payer: BLUE CROSS/BLUE SHIELD | Admitting: Physical Therapy

## 2017-06-11 ENCOUNTER — Encounter: Payer: Self-pay | Admitting: Physical Therapy

## 2017-06-11 DIAGNOSIS — M25551 Pain in right hip: Secondary | ICD-10-CM | POA: Diagnosis not present

## 2017-06-11 DIAGNOSIS — M25651 Stiffness of right hip, not elsewhere classified: Secondary | ICD-10-CM

## 2017-06-11 DIAGNOSIS — R262 Difficulty in walking, not elsewhere classified: Secondary | ICD-10-CM

## 2017-06-12 NOTE — Therapy (Signed)
[place], [place], [address] Phone: [phone]   Fax:  [phone]  Physical Therapy Treatment  Patient Details  Name: Kristy [name] MRN: [id number] Date of Birth: 14-Jul-1971 Referring Provider: Chana Bode PA-C    Encounter Date: 06/11/2017  PT End of Session - 06/11/17 1602    Visit Number  7    Number of Visits  16    Date for PT Re-Evaluation  07/07/17    Authorization Type  BCBS     PT Start Time  [phone] Patient was 14 minutes late for appointment     PT Stop Time  0430    PT Time Calculation (min)  31 min    Activity Tolerance  Patient tolerated treatment well    Behavior During Therapy  [facility] for tasks assessed/performed       Past Medical History:  Diagnosis Date  . BRCA negative 01/2013  . Dysrhythmia    a. 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 H. pylori infection 10/2010  . Iron deficiency anemia, unspecified   . Obesity   . Seasonal allergies    ragweed, tree pollen, mild and dust per testing 08/2012  . Unspecified vitamin D deficiency     Past Surgical History:  Procedure Laterality Date  . CESAREAN SECTION    . [place] N/A 09/14/2015   Procedure: DILATATION & CURETTAGE/HYSTEROSCOPY WITH NOVASURE ABLATION;  Surgeon: Gae Dry, MD;  Location: [facility];  Service: Gynecology;  Laterality: N/A;  . IUD REMOVAL N/A 09/14/2015   Procedure: INTRAUTERINE DEVICE (IUD) REMOVAL;  Surgeon: Gae Dry, MD;  Location: [facility];  Service: Gynecology;  Laterality: N/A;  . LAPAROSCOPIC BILATERAL SALPINGECTOMY Bilateral 09/14/2015   Procedure: LAPAROSCOPIC BILATERAL SALPINGECTOMY;  Surgeon: Gae Dry, MD;  Location: [facility];  Service: Gynecology;  Laterality: Bilateral;  . TUBAL LIGATION Bilateral 09/14/2015   Procedure: BILATERAL TUBAL LIGATION;  Surgeon: Gae Dry, MD;  Location: [facility];  Service: Gynecology;  Laterality:  Bilateral;  . VULVAR LESION REMOVAL N/A 09/14/2015   Procedure: VULVAR LESION;  Surgeon: Gae Dry, MD;  Location: [facility];  Service: Gynecology;  Laterality: N/A;  . WISDOM TOOTH EXTRACTION      There were no vitals filed for this visit.  Subjective Assessment - 06/11/17 1600    Subjective  Patient wore heels on Sunday and her hip became sore the next day. Her hip is gfood on some days and then becomes painful. She had a little pain this morning but nothing significant.     Pertinent History  Cardiac     Currently in Pain?  No/denies                      [facility] Adult PT Treatment/Exercise - 06/12/17 0001      Knee/Hip Exercises: Stretches   Active Hamstring Stretch  2 reps;30 seconds    Hip Flexor Stretch  2 reps;30 seconds with opp knee flexion    ITB Stretch  2 reps;30 seconds and figure 4     Piriformis Stretch Limitations  2x30sec hold     Other Knee/Hip Stretches  lower trunk rotation x10       Knee/Hip Exercises: Aerobic   Nustep  5 min L 5       Knee/Hip Exercises: Supine   Bridges  10 reps    Bridges with Clamshell  10 reps green    Single Leg Bridge  5 reps;3 sets each    Straight Leg Raises Limitations  x 15 each     Other Supine Knee/Hip Exercises  supine clam shell green 2x10       Knee/Hip Exercises: Sidelying   Hip ABduction  10 reps;1 set;Both    Clams  x10 both  yellow       Iontophoresis   Type of Iontophoresis  Dexamethasone    Location  Right hip     Dose  1 ml    Time  6 hour wear time              PT Education - 06/11/17 1602    Education provided  Yes    Education Details  reviewed technique with HEP and the improtance of doing HEP     Person(s) Educated  Patient    Methods  Explanation    Comprehension  Verbalized understanding;Returned demonstration       PT Short Term Goals - 06/02/17 0916      PT SHORT TERM GOAL #1   Title  Patient will demsotrate full passive right hip ER/IR     Baseline  full, some pulling  felt with Right IR     Time  4    Period  Weeks    Status  Partially Met      PT SHORT TERM GOAL #2   Title  Patient will increase gross right hip strength to 4/5    Baseline  4-/5 hip abduction    Time  4    Period  Weeks    Status  On-going      PT SHORT TERM GOAL #3   Title  Patient will be independent with initial HEP     Baseline  reports mostly only stretching     Time  4    Period  Weeks    Status  On-going        PT Long Term Goals - 05/12/17 1458      PT LONG TERM GOAL #1   Title  Patient will ambualte 3000' without increased pain in order to perfrom IADL's     Time  8    Period  Weeks    Status  New    Target Date  07/07/17      PT LONG TERM GOAL #2   Title  Patient will stand for 2 hours without increased pain in order to perfrom work tasks     Time  8    Period  Weeks    Status  New    Target Date  07/07/17      PT LONG TERM GOAL #3   Title  Patient will be independent with strengthening porgram and stretching in order to maintain gains made.     Time  8    Period  Weeks    Status  New    Target Date  07/07/17            Plan - 06/11/17 1603    Clinical Impression Statement  Patient was 14 minutes late for appointment. Patient was 14 minutes late to her appointment. Therapy added single leg stance to HEP. She had no pain with single leg stance so she was also given the cone drill to the counter.  Therapy also reviewed stretching technique and added sink stretch and prayr stretch. Therapy applied iontophoresis to try to knowck out the last it of inflammation that is hanging around.     Rehab Potential  Good    PT Frequency  2x / week    PT Duration  8 weeks    PT Treatment/Interventions  ADLs/Self Care Home Management;Electrical Stimulation;Cryotherapy;Iontophoresis 32m/ml Dexamethasone;Gait training;Stair training;Therapeutic activities;Therapeutic exercise;Neuromuscular re-education;Patient/family education;Passive range of motion;Dry  needling;Splinting;Taping    PT Next Visit Plan  focus hip/core strength, hip flexibility ; needs to make appointments     PT Home Exercise Plan  SLR; clam shell; piriformis stretch, figure 4 ; LTR; IT band roll out , bridge, hip flexor stretch , side hip abduction    Consulted and Agree with Plan of Care  Patient       Patient will benefit from skilled therapeutic intervention in order to improve the following deficits and impairments:  Abnormal gait, Decreased activity tolerance, Decreased strength, Pain, Decreased range of motion  Visit Diagnosis: Pain in right hip  Stiffness of right hip, not elsewhere classified  Difficulty in walking, not elsewhere classified     Problem List Patient Active Problem List   Diagnosis Date Noted  . Hip pain, right 04/23/2017  . PVC (premature ventricular contraction) 03/05/2017  . Family history of coronary artery disease 02/06/2017  . Blurred vision 02/06/2017  . Elevated blood-pressure reading without diagnosis of hypertension 02/06/2017  . Heart palpitations 02/06/2017  . Headache syndrome 02/06/2017  . Other fatigue 02/06/2017  . Daytime somnolence 02/06/2017  . Family history of breast cancer 11/25/2016  . Upper airway cough syndrome 09/04/2016  . Dyspnea on exertion 09/04/2016  . Female sterility 09/14/2015  . Menorrhagia with regular cycle 09/14/2015  . Fibroids, submucosal 09/14/2015  . Ovarian cyst, right 09/14/2015  . Vulvar lesion 09/14/2015  . Chronic vaginitis 09/14/2015  . Encounter for health maintenance examination in adult 06/28/2015  . Obesity with serious comorbidity 06/28/2015  . Vitamin D deficiency 06/28/2015  . Anemia, iron deficiency 06/28/2015  . Microscopic hematuria 06/28/2015  . Allergic rhinitis 03/31/2013    DCarney LivingPT DPT  06/12/2017, 12:48 PM  [facility] [address]Phone: [phone]  Fax:   [phone] Name: Kristy EtheringtonMRN: [id number] of Birth: [birth year]

## 2017-06-16 ENCOUNTER — Ambulatory Visit: Payer: BLUE CROSS/BLUE SHIELD | Attending: Medical | Admitting: Physical Therapy

## 2017-06-16 ENCOUNTER — Encounter: Payer: Self-pay | Admitting: Physical Therapy

## 2017-06-16 DIAGNOSIS — R262 Difficulty in walking, not elsewhere classified: Secondary | ICD-10-CM | POA: Diagnosis not present

## 2017-06-16 DIAGNOSIS — M25551 Pain in right hip: Secondary | ICD-10-CM | POA: Insufficient documentation

## 2017-06-16 DIAGNOSIS — M25651 Stiffness of right hip, not elsewhere classified: Secondary | ICD-10-CM | POA: Insufficient documentation

## 2017-06-17 NOTE — Therapy (Signed)
[place], [place], [address] Phone: [phone]   Fax:  [phone]  Physical Therapy Treatment  Patient Details  Name: Kristy [name] MRN: [id number] Date of Birth: April 23, 1971 Referring Provider: Chana Bode PA-C    Encounter Date: 06/16/2017  PT End of Session - 06/16/17 0904    Visit Number  8    Number of Visits  16    Date for PT Re-Evaluation  07/07/17    Authorization Type  BCBS     PT Start Time  0901 Patient 16 minutes late for appointment     PT Stop Time  0930    PT Time Calculation (min)  29 min    Activity Tolerance  Patient tolerated treatment well    Behavior During Therapy  [facility] for tasks assessed/performed       Past Medical History:  Diagnosis Date  . BRCA negative 01/2013  . Dysrhythmia    a. 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 H. pylori infection 10/2010  . Iron deficiency anemia, unspecified   . Obesity   . Seasonal allergies    ragweed, tree pollen, mild and dust per testing 08/2012  . Unspecified vitamin D deficiency     Past Surgical History:  Procedure Laterality Date  . CESAREAN SECTION    . [place] N/A 09/14/2015   Procedure: DILATATION & CURETTAGE/HYSTEROSCOPY WITH NOVASURE ABLATION;  Surgeon: Gae Dry, MD;  Location: [facility];  Service: Gynecology;  Laterality: N/A;  . IUD REMOVAL N/A 09/14/2015   Procedure: INTRAUTERINE DEVICE (IUD) REMOVAL;  Surgeon: Gae Dry, MD;  Location: [facility];  Service: Gynecology;  Laterality: N/A;  . LAPAROSCOPIC BILATERAL SALPINGECTOMY Bilateral 09/14/2015   Procedure: LAPAROSCOPIC BILATERAL SALPINGECTOMY;  Surgeon: Gae Dry, MD;  Location: [facility];  Service: Gynecology;  Laterality: Bilateral;  . TUBAL LIGATION Bilateral 09/14/2015   Procedure: BILATERAL TUBAL LIGATION;  Surgeon: Gae Dry, MD;  Location: [facility];  Service: Gynecology;  Laterality:  Bilateral;  . VULVAR LESION REMOVAL N/A 09/14/2015   Procedure: VULVAR LESION;  Surgeon: Gae Dry, MD;  Location: [facility];  Service: Gynecology;  Laterality: N/A;  . WISDOM TOOTH EXTRACTION      There were no vitals filed for this visit.  Subjective Assessment - 06/16/17 0903    Subjective  Patient reports a little pain over the weekend going up and down the stairs but overall she has had very little pain. She did not wear heels this weekend. patient was 17 minutes late for her appointment.     Limitations  Standing;Walking    How long can you sit comfortably?  Stiffens when she sits     How long can you stand comfortably?  Gets agrevated the longer she stands    How long can you walk comfortably?  With community ambualtion     Diagnostic tests  X-ray per patient negative     Patient Stated Goals  No pain     Currently in Pain?  No/denies                      [facility] Adult PT Treatment/Exercise - 06/17/17 1244      Knee/Hip Exercises: Stretches   Active Hamstring Stretch  2 reps;30 seconds    Piriformis Stretch Limitations  2x30sec hold     Other Knee/Hip Stretches  lower trunk rotation x10       Knee/Hip Exercises: Aerobic  Nustep  5 min L 5       Knee/Hip Exercises: Standing   Hip Flexion Limitations  slow march 2x10     Abduction Limitations  2x10     Extension Limitations  2x10     SLS  3x15 sec hold       Knee/Hip Exercises: Supine   Bridges  10 reps    Straight Leg Raises Limitations  x 15 each       Iontophoresis   Type of Iontophoresis  Dexamethasone    Location  Right hip     Dose  1 ml    Time  6 hour wear time              PT Education - 06/16/17 0904    Education provided  Yes    Education Details  reviewed HEP     Person(s) Educated  Patient    Methods  Explanation;Demonstration;Verbal cues    Comprehension  Verbalized understanding;Returned demonstration       PT Short Term Goals - 06/02/17 0916      PT SHORT TERM GOAL  #1   Title  Patient will demsotrate full passive right hip ER/IR     Baseline  full, some pulling felt with Right IR     Time  4    Period  Weeks    Status  Partially Met      PT SHORT TERM GOAL #2   Title  Patient will increase gross right hip strength to 4/5    Baseline  4-/5 hip abduction    Time  4    Period  Weeks    Status  On-going      PT SHORT TERM GOAL #3   Title  Patient will be independent with initial HEP     Baseline  reports mostly only stretching     Time  4    Period  Weeks    Status  On-going        PT Long Term Goals - 06/16/17 6384      PT LONG TERM GOAL #1   Title  Patient will ambualte 3000' without increased pain in order to perfrom IADL's     Baseline  walking further without pain     Time  8    Period  Weeks    Status  On-going      PT LONG TERM GOAL #2   Title  Patient will stand for 2 hours without increased pain in order to perfrom work tasks     Time  8    Period  Weeks    Status  On-going      PT LONG TERM GOAL #3   Title  Patient will be independent with strengthening porgram and stretching in order to maintain gains made.     Time  8    Period  Weeks    Status  On-going            Plan - 06/16/17 0905    Clinical Impression Statement  Patient was limited by time but tolerated exercises well. She was advised to continue with her stretching and strengthening exercises. She was given standing exercises.     Clinical Presentation  Evolving    Clinical Decision Making  Moderate    Rehab Potential  Good    PT Frequency  2x / week    PT Duration  8 weeks    PT Treatment/Interventions  ADLs/Self Care Home Management;Electrical Stimulation;Cryotherapy;Iontophoresis  9m/ml Dexamethasone;Gait training;Stair training;Therapeutic activities;Therapeutic exercise;Neuromuscular re-education;Patient/family education;Passive range of motion;Dry needling;Splinting;Taping    PT Next Visit Plan  focus hip/core strength, hip flexibility ; needs to  make appointments     PT Home Exercise Plan  SLR; clam shell; piriformis stretch, figure 4 ; LTR; IT band roll out , bridge, hip flexor stretch , side hip abduction    Consulted and Agree with Plan of Care  Patient       Patient will benefit from skilled therapeutic intervention in order to improve the following deficits and impairments:  Abnormal gait, Decreased activity tolerance, Decreased strength, Pain, Decreased range of motion  Visit Diagnosis: Pain in right hip  Stiffness of right hip, not elsewhere classified  Difficulty in walking, not elsewhere classified     Problem List Patient Active Problem List   Diagnosis Date Noted  . Hip pain, right 04/23/2017  . PVC (premature ventricular contraction) 03/05/2017  . Family history of coronary artery disease 02/06/2017  . Blurred vision 02/06/2017  . Elevated blood-pressure reading without diagnosis of hypertension 02/06/2017  . Heart palpitations 02/06/2017  . Headache syndrome 02/06/2017  . Other fatigue 02/06/2017  . Daytime somnolence 02/06/2017  . Family history of breast cancer 11/25/2016  . Upper airway cough syndrome 09/04/2016  . Dyspnea on exertion 09/04/2016  . Female sterility 09/14/2015  . Menorrhagia with regular cycle 09/14/2015  . Fibroids, submucosal 09/14/2015  . Ovarian cyst, right 09/14/2015  . Vulvar lesion 09/14/2015  . Chronic vaginitis 09/14/2015  . Encounter for health maintenance examination in adult 06/28/2015  . Obesity with serious comorbidity 06/28/2015  . Vitamin D deficiency 06/28/2015  . Anemia, iron deficiency 06/28/2015  . Microscopic hematuria 06/28/2015  . Allergic rhinitis 03/31/2013    DCarney LivingPT DPT  06/17/2017, 12:46 PM  [id number] [address]Phone: [phone]  Fax:  [phone] Name: CLorae RoigMRN: [id number] of Birth: [birth year]

## 2017-06-18 ENCOUNTER — Ambulatory Visit: Payer: BLUE CROSS/BLUE SHIELD | Admitting: Physical Therapy

## 2017-06-23 ENCOUNTER — Ambulatory Visit: Payer: BLUE CROSS/BLUE SHIELD | Admitting: Physical Therapy

## 2017-06-25 ENCOUNTER — Telehealth: Payer: Self-pay

## 2017-06-25 ENCOUNTER — Ambulatory Visit: Payer: BLUE CROSS/BLUE SHIELD | Admitting: Physical Therapy

## 2017-06-25 NOTE — Telephone Encounter (Signed)
Pt called back to  Let you know that she check with her insurance company about weight loss meds the will cover bontril . She said that she tried one of her friend pill and it worked.wants to know if she can have rx for this

## 2017-06-26 ENCOUNTER — Encounter: Payer: Self-pay | Admitting: Physical Therapy

## 2017-06-26 ENCOUNTER — Ambulatory Visit: Payer: BLUE CROSS/BLUE SHIELD | Admitting: Physical Therapy

## 2017-06-26 DIAGNOSIS — M25551 Pain in right hip: Secondary | ICD-10-CM | POA: Diagnosis not present

## 2017-06-26 DIAGNOSIS — R262 Difficulty in walking, not elsewhere classified: Secondary | ICD-10-CM | POA: Diagnosis not present

## 2017-06-26 DIAGNOSIS — M25651 Stiffness of right hip, not elsewhere classified: Secondary | ICD-10-CM | POA: Diagnosis not present

## 2017-06-26 NOTE — Therapy (Signed)
[place], [place], [address] Phone: [phone]   Fax:  [phone]  Physical Therapy Treatment  Patient Details  Name: Kristy [name] MRN: [id number] Date of Birth: 09-02-1970 Referring Provider: Chana Bode PA-C    Encounter Date: 06/26/2017    Past Medical History:  Diagnosis Date  . BRCA negative 01/2013  . Dysrhythmia    a. 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 H. pylori infection 10/2010  . Iron deficiency anemia, unspecified   . Obesity   . Seasonal allergies    ragweed, tree pollen, mild and dust per testing 08/2012  . Unspecified vitamin D deficiency     Past Surgical History:  Procedure Laterality Date  . CESAREAN SECTION    . [place] N/A 09/14/2015   Procedure: DILATATION & CURETTAGE/HYSTEROSCOPY WITH NOVASURE ABLATION;  Surgeon: Gae Dry, MD;  Location: [facility];  Service: Gynecology;  Laterality: N/A;  . IUD REMOVAL N/A 09/14/2015   Procedure: INTRAUTERINE DEVICE (IUD) REMOVAL;  Surgeon: Gae Dry, MD;  Location: [facility];  Service: Gynecology;  Laterality: N/A;  . LAPAROSCOPIC BILATERAL SALPINGECTOMY Bilateral 09/14/2015   Procedure: LAPAROSCOPIC BILATERAL SALPINGECTOMY;  Surgeon: Gae Dry, MD;  Location: [facility];  Service: Gynecology;  Laterality: Bilateral;  . TUBAL LIGATION Bilateral 09/14/2015   Procedure: BILATERAL TUBAL LIGATION;  Surgeon: Gae Dry, MD;  Location: [facility];  Service: Gynecology;  Laterality: Bilateral;  . VULVAR LESION REMOVAL N/A 09/14/2015   Procedure: VULVAR LESION;  Surgeon: Gae Dry, MD;  Location: [facility];  Service: Gynecology;  Laterality: N/A;  . WISDOM TOOTH EXTRACTION      There were no vitals filed for this visit.  Subjective Assessment - 06/26/17 1509    Subjective  Hip feels better today.  It hurt yesterday.    Currently in Pain?  Yes    Pain Score  -- mild    Pain Location  Hip    Pain Orientation  Right;Lateral    Pain Descriptors / Indicators  -- feels bruised on the inside,  like  i worked out and was sore the next day    Pain Type  Acute pain    Pain Frequency  Intermittent    Aggravating Factors   sitting too long then getting up.   Soitting id OK    Pain Relieving Factors  pain meds and massage    Effect of Pain on Daily Activities  painful ADL         [facility] PT Assessment - 06/26/17 0001      Strength   Right Hip Extension  4/5    Right Hip ABduction  4-/5                  [facility] Adult PT Treatment/Exercise - 06/26/17 0001      Knee/Hip Exercises: Stretches   Passive Hamstring Stretch  3 reps;30 seconds    Quad Stretch  3 reps;30 seconds prone    Piriformis Stretch  3 reps;30 seconds    Piriformis Stretch Limitations  PROM  tight deep vs superficial    Gastroc Stretch  1 rep;30 seconds    Gastroc Stretch Limitations  HEP    Other Knee/Hip Stretches  Prone Hip IR/ER stretch/  AROM. 10-15 seconds,  ROM increased with reps.  HEP      Knee/Hip Exercises: Standing   Gait Training  YOGA walking increased pain with increased hip muscle engagement  40 +  feet.      Knee/Hip Exercises: Supine   Bridges  10 reps fatigue    Other Supine Knee/Hip Exercises  supine ab braced with exhale and ball squeeze.        Knee/Hip Exercises: Sidelying   Hip ABduction  10 reps    Clams  10 X       Knee/Hip Exercises: Prone   Hip Extension  10 reps right      Modalities   Modalities  Moist Heat      Moist Heat Therapy   Moist Heat Location  Hip concurrent with supine ex abd stretches,                 PT Short Term Goals - 06/26/17 1559      PT SHORT TERM GOAL #1   Title  Patient will demsotrate full passive right hip ER/IR     Baseline  full, some stiffness with IR    Time  4    Period  Weeks    Status  Partially Met      PT SHORT TERM GOAL #2   Title  Patient will increase gross right hip strength to 4/5     Baseline  4-/5 abd,  4/5 extension    Time  4    Status  On-going      PT SHORT TERM GOAL #3   Title  Patient will be independent with initial HEP     Baseline  tries to be compliant.  No questions about HEP    Time  4    Period  Weeks    Status  Partially Met        PT Long Term Goals - 06/16/17 0916      PT LONG TERM GOAL #1   Title  Patient will ambualte 3000' without increased pain in order to perfrom IADL's     Baseline  walking further without pain     Time  8    Period  Weeks    Status  On-going      PT LONG TERM GOAL #2   Title  Patient will stand for 2 hours without increased pain in order to perfrom work tasks     Time  8    Period  Weeks    Status  On-going      PT LONG TERM GOAL #3   Title  Patient will be independent with strengthening porgram and stretching in order to maintain gains made.     Time  8    Period  Weeks    Status  On-going            Plan - 06/26/17 1601    Clinical Impression Statement  Patient  able to decrease pain with HEP.  AROM right hip limited about 25 %  after stretching,  PROM WNL.  Patient had some pain when i tried to have her alter her gait.    Calf stretch added to her HEP.    Patient to try getting in/ of car with sitting first, then moving legs to see if she can decrease her pain. STG#3 partially met.     PT Next Visit Plan  focus hip/core strength, hip flexibility ; needs to make appointments     PT Home Exercise Plan  SLR; clam shell; piriformis stretch, figure 4 ; LTR; IT band roll out , bridge, hip flexor stretch , side hip abduction,  calf stretch    Consulted and Agree  with Plan of Care  Patient       Patient will benefit from skilled therapeutic intervention in order to improve the following deficits and impairments:     Visit Diagnosis: Pain in right hip  Stiffness of right hip, not elsewhere classified  Difficulty in walking, not elsewhere classified     Problem List Patient Active Problem List    Diagnosis Date Noted  . Hip pain, right 04/23/2017  . PVC (premature ventricular contraction) 03/05/2017  . Family history of coronary artery disease 02/06/2017  . Blurred vision 02/06/2017  . Elevated blood-pressure reading without diagnosis of hypertension 02/06/2017  . Heart palpitations 02/06/2017  . Headache syndrome 02/06/2017  . Other fatigue 02/06/2017  . Daytime somnolence 02/06/2017  . Family history of breast cancer 11/25/2016  . Upper airway cough syndrome 09/04/2016  . Dyspnea on exertion 09/04/2016  . Female sterility 09/14/2015  . Menorrhagia with regular cycle 09/14/2015  . Fibroids, submucosal 09/14/2015  . Ovarian cyst, right 09/14/2015  . Vulvar lesion 09/14/2015  . Chronic vaginitis 09/14/2015  . Encounter for health maintenance examination in adult 06/28/2015  . Obesity with serious comorbidity 06/28/2015  . Vitamin D deficiency 06/28/2015  . Anemia, iron deficiency 06/28/2015  . Microscopic hematuria 06/28/2015  . Allergic rhinitis 03/31/2013    HARRIS,KAREN PTA 06/26/2017, 4:06 PM  [facility] [address] [place], [place], [address] Phone: [phone]   Fax:  [phone]  Name: Kristy [name] MRN: [id number] Date of Birth: 1971/01/13

## 2017-06-26 NOTE — Patient Instructions (Signed)
Calf Stretch    Place one leg forward, bent, other leg behind and straight. Lean forward keeping back heel flat. Hold _30___ seconds while counting out loud. Repeat with other leg forward. Repeat __3__ times. Do _1___ sessions per day.  http://gt2.exer.us/478   Copyright  VHI. All rights reserved.     Also,  After getting hip warm,    On stomach with knees bent  Move feet apart to stretch 10-  15 seconds  5 to 10 X   1 x a day

## 2017-06-30 NOTE — Telephone Encounter (Signed)
Pt was notified of this 

## 2017-06-30 NOTE — Telephone Encounter (Signed)
I have put in a call to speak to cardiology about this medication as it is higher risk.   I will call back after consulting with cardiology

## 2017-07-01 ENCOUNTER — Telehealth: Payer: Self-pay

## 2017-07-01 ENCOUNTER — Other Ambulatory Visit: Payer: Self-pay | Admitting: Obstetrics and Gynecology

## 2017-07-01 DIAGNOSIS — B9689 Other specified bacterial agents as the cause of diseases classified elsewhere: Secondary | ICD-10-CM

## 2017-07-01 DIAGNOSIS — N760 Acute vaginitis: Principal | ICD-10-CM

## 2017-07-01 MED ORDER — METRONIDAZOLE 500 MG PO TABS: 500.0000 mg | ORAL_TABLET | Freq: Two times a day (BID) | ORAL | 0 refills | Status: AC

## 2017-07-01 MED ORDER — CLINDAMYCIN PHOSPHATE 2 % VA CREA: 1.0000 | TOPICAL_CREAM | Freq: Every day | VAGINAL | 0 refills | Status: DC

## 2017-07-01 NOTE — Telephone Encounter (Signed)
Rx flagyl eRxd. No EtOH for 10 days after Rx start. F/u prn sx. Cont cleocin wkly as preventive.

## 2017-07-01 NOTE — Progress Notes (Signed)
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

## 2017-07-01 NOTE — Telephone Encounter (Signed)
Rx cleocin eRxd. RN to notify pt.

## 2017-07-01 NOTE — Telephone Encounter (Signed)
Pt would like a refill on Metronidazole pills. CB# [phone]

## 2017-07-01 NOTE — Telephone Encounter (Signed)
Pt aware via voicemail 

## 2017-07-01 NOTE — Telephone Encounter (Signed)
Pt called back stating the Cleocin does not help, she has been taking that as a preventative. Can the pills be called in? CB# [phone]

## 2017-07-01 NOTE — Progress Notes (Signed)
Rx flagyl. Rx cleocin doesn't help. RTO if sx persist.

## 2017-07-02 ENCOUNTER — Ambulatory Visit: Payer: BLUE CROSS/BLUE SHIELD | Admitting: Obstetrics and Gynecology

## 2017-07-04 ENCOUNTER — Other Ambulatory Visit: Payer: Self-pay | Admitting: Maternal Newborn

## 2017-07-04 DIAGNOSIS — B3731 Acute candidiasis of vulva and vagina: Secondary | ICD-10-CM

## 2017-07-04 DIAGNOSIS — B373 Candidiasis of vulva and vagina: Secondary | ICD-10-CM

## 2017-07-04 MED ORDER — FLUCONAZOLE 150 MG PO TABS: 150.0000 mg | ORAL_TABLET | Freq: Once | ORAL | 1 refills | Status: AC

## 2017-07-04 NOTE — Telephone Encounter (Signed)
Pt rcvd Metronidazole. Pt is now itching & requesting Diflucan. EE#[phone]

## 2017-07-04 NOTE — Progress Notes (Signed)
Rx for Diflucan, patient states she was to call for prescription if sx developed and she is now itching.  Avel Sensor, CNM 07/04/2017  4:51 PM

## 2017-07-04 NOTE — Telephone Encounter (Signed)
Per Jovita Kussmaul, Rx sent. Unable to reach pt to notify. VM full.

## 2017-07-10 ENCOUNTER — Ambulatory Visit: Payer: BLUE CROSS/BLUE SHIELD | Admitting: Physical Therapy

## 2017-07-10 ENCOUNTER — Encounter: Payer: Self-pay | Admitting: Physical Therapy

## 2017-07-10 DIAGNOSIS — R262 Difficulty in walking, not elsewhere classified: Secondary | ICD-10-CM | POA: Diagnosis not present

## 2017-07-10 DIAGNOSIS — M25551 Pain in right hip: Secondary | ICD-10-CM | POA: Diagnosis not present

## 2017-07-10 DIAGNOSIS — M25651 Stiffness of right hip, not elsewhere classified: Secondary | ICD-10-CM | POA: Diagnosis not present

## 2017-07-10 NOTE — Patient Instructions (Signed)
Gymball: Bridging (Single Leg)    Lie on back, calves on ball. With left leg vertical, slowly raise and lower buttocks. Repeat _15__ times. Repeat with other leg for set. Rest 30___ seconds after set. Do _2__ sets per session.  http://plyo.exer.us/166   Copyright  VHI. All rights reserved.   Voncille Lo, PT Certified Exercise Expert for the Aging Adult  07/10/17 10:39 AM Phone: [phone] Fax: [phone]

## 2017-07-10 NOTE — Therapy (Signed)
[place], [place], [address] Phone: [phone]   Fax:  [phone]  Physical Therapy Treatment/Discharge Note  Patient Details  Name: Kristy [name] MRN: [id number] Date of Birth: 1971-03-18 Referring Provider: Chana Bode PA-C    Encounter Date: 07/10/2017  PT End of Session - 07/10/17 1050    Visit Number  9    Number of Visits  16    Date for PT Re-Evaluation  07/07/17    Authorization Type  BCBS     PT Start Time  1024    PT Stop Time  1100    PT Time Calculation (min)  36 min    Activity Tolerance  Patient tolerated treatment well    Behavior During Therapy  [facility] for tasks assessed/performed       Past Medical History:  Diagnosis Date  . BRCA negative 01/2013  . Dysrhythmia    a. 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 H. pylori infection 10/2010  . Iron deficiency anemia, unspecified   . Obesity   . Seasonal allergies    ragweed, tree pollen, mild and dust per testing 08/2012  . Unspecified vitamin D deficiency     Past Surgical History:  Procedure Laterality Date  . CESAREAN SECTION    . [place] N/A 09/14/2015   Procedure: DILATATION & CURETTAGE/HYSTEROSCOPY WITH NOVASURE ABLATION;  Surgeon: Gae Dry, MD;  Location: [facility];  Service: Gynecology;  Laterality: N/A;  . IUD REMOVAL N/A 09/14/2015   Procedure: INTRAUTERINE DEVICE (IUD) REMOVAL;  Surgeon: Gae Dry, MD;  Location: [facility];  Service: Gynecology;  Laterality: N/A;  . LAPAROSCOPIC BILATERAL SALPINGECTOMY Bilateral 09/14/2015   Procedure: LAPAROSCOPIC BILATERAL SALPINGECTOMY;  Surgeon: Gae Dry, MD;  Location: [facility];  Service: Gynecology;  Laterality: Bilateral;  . TUBAL LIGATION Bilateral 09/14/2015   Procedure: BILATERAL TUBAL LIGATION;  Surgeon: Gae Dry, MD;  Location: [facility];  Service: Gynecology;  Laterality: Bilateral;  . VULVAR LESION  REMOVAL N/A 09/14/2015   Procedure: VULVAR LESION;  Surgeon: Gae Dry, MD;  Location: [facility];  Service: Gynecology;  Laterality: N/A;  . WISDOM TOOTH EXTRACTION      There were no vitals filed for this visit.  Subjective Assessment - 07/10/17 1026    Subjective  My hip is feeling better      Pertinent History  Cardiac     Limitations  Standing;Walking    How long can you sit comfortably?  when I get out of the car , I have to work it out    How long can you stand comfortably?  Doesnt bother me  standing    How long can you walk comfortably?  able to walk in community    Diagnostic tests  X-ray per patient negative     Currently in Pain?  No/denies initially when getting up I have pain but able to work it out    Pain Score  0-No pain    Pain Location  Hip    Pain Orientation  Right;Lateral         [facility] PT Assessment - 07/10/17 1047      AROM   Right Hip Flexion  122      Strength   Right Hip Flexion  4+/5    Right Hip Extension  4+/5    Right Hip ABduction  4+/5    Left Hip Flexion  5/5    Left Hip ABduction  5/5    Right Knee Flexion  5/5    Right Knee Extension  5/5      Flexibility   Hamstrings  hamstring 90/90 5 degrees bilateral       Palpation   Palpation comment  no pain at greater trochanter today                  [facility] Adult PT Treatment/Exercise - 07/10/17 1033      Knee/Hip Exercises: Stretches   Passive Hamstring Stretch  2 reps;30 seconds    Quad Stretch  3 reps;30 seconds prone    Piriformis Stretch  3 reps;30 seconds    Other Knee/Hip Stretches  lower trunk rotation x10  also separate feet to include increased IR of right hip      Knee/Hip Exercises: Standing   Hip Flexion Limitations  slow march 2x10  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        Knee/Hip Exercises: Sidelying   Clams  10 red t band             PT Education - 07/10/17 1040    Education provided  Yes    Education Details  reviewed HEP and added single limb bridge for strength    Person(s) Educated  Patient    Methods  Explanation;Demonstration;Verbal cues;Tactile cues;Handout    Comprehension  Verbalized understanding;Returned demonstration       PT Short Term Goals - 07/10/17 1042      PT SHORT TERM GOAL #1   Title  Patient will demsotrate full passive right hip ER/IR     Baseline  full, some stiffness with IR but able to work out with trunk rotation    Time  4    Period  Weeks    Status  Achieved      PT SHORT TERM GOAL #2   Title  Patient will increase gross right hip strength to 4/5    Baseline  See Flowsheet    Time  4    Period  Weeks    Status  Achieved      PT SHORT TERM GOAL #3   Title  Patient will be independent with initial HEP     Time  4    Period  Weeks    Status  Achieved        PT Long Term Goals - 07/10/17 1057      PT LONG TERM GOAL #1   Title  Patient will ambualte 3000' without increased pain in order to perfrom IADL's     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     Baseline  Pt performs HEP independently in clinic    Time  8    Period  Weeks    Status  Achieved              Patient will benefit from skilled therapeutic intervention in order to improve the following deficits and  impairments:     Visit Diagnosis: Pain in right hip  Stiffness of right hip, not elsewhere classified  Difficulty in walking, not elsewhere classified     Problem List Patient Active Problem List   Diagnosis Date Noted  . Hip pain, right 04/23/2017  . PVC (premature ventricular contraction) 03/05/2017  . Family history of coronary artery disease 02/06/2017  . Blurred vision 02/06/2017  . Elevated blood-pressure reading without diagnosis of hypertension 02/06/2017  . Heart palpitations 02/06/2017  . Headache syndrome 02/06/2017  . Other fatigue 02/06/2017  . Daytime somnolence 02/06/2017  . Family history of breast cancer 11/25/2016  . Upper airway cough syndrome 09/04/2016  . Dyspnea on exertion 09/04/2016  . Female sterility 09/14/2015  . Menorrhagia with regular cycle 09/14/2015  . Fibroids, submucosal 09/14/2015  . Ovarian cyst, right 09/14/2015  . Vulvar lesion 09/14/2015  . Chronic vaginitis 09/14/2015  . Encounter for health maintenance examination in adult 06/28/2015  . Obesity with serious comorbidity 06/28/2015  . Vitamin D deficiency 06/28/2015  . Anemia, iron deficiency 06/28/2015  . Microscopic hematuria 06/28/2015  . Allergic rhinitis 03/31/2013   Voncille Lo, PT Certified Exercise Expert for the Aging Adult  07/10/17 5:48 PM Phone: [phone] Fax: [place] [facility] [address] [place], [place], [address] Phone: [phone]   Fax:  [phone]  Name: Kristy [name] MRN: [id number] Date of Birth: 23-Aug-1970   PHYSICAL THERAPY DISCHARGE SUMMARY  Visits from Start of Care: 9  Current functional level related to goals / functional outcomes: See above   Remaining deficits: Slight tigtness in right IR but able to work out with stretches   Education / Equipment: HEP Plan: Patient agrees to discharge.  Patient goals were met. Patient is being discharged due to meeting the  stated rehab goals.  ?????    And being pleased with current functional progress.  Voncille Lo, PT Certified Exercise Expert for the Aging Adult  07/10/17 5:48 PM Phone: [phone] Fax: [phone]

## 2017-07-16 ENCOUNTER — Telehealth: Payer: Self-pay | Admitting: Family Medicine

## 2017-07-16 NOTE — Telephone Encounter (Signed)
Pt called and left message and wants to know if she can now have the diet pill she has been wanting since cardiology said she only had acid reflux.

## 2017-07-17 ENCOUNTER — Other Ambulatory Visit: Payer: Self-pay

## 2017-07-17 ENCOUNTER — Ambulatory Visit: Payer: BLUE CROSS/BLUE SHIELD | Admitting: Medical

## 2017-07-17 ENCOUNTER — Other Ambulatory Visit: Payer: Self-pay | Admitting: Medical

## 2017-07-17 ENCOUNTER — Encounter: Payer: Self-pay | Admitting: Medical

## 2017-07-17 VITALS — BP 132/80 | HR 65 | Temp 98.5°F | Wt 224.4 lb

## 2017-07-17 DIAGNOSIS — E669 Obesity, unspecified: Secondary | ICD-10-CM

## 2017-07-17 DIAGNOSIS — J019 Acute sinusitis, unspecified: Secondary | ICD-10-CM | POA: Diagnosis not present

## 2017-07-17 MED ORDER — PHENDIMETRAZINE TARTRATE 35 MG PO TABS
1.0000 | ORAL_TABLET | Freq: Every day | ORAL | 0 refills | Status: DC
Start: 2017-07-17 — End: 2017-11-11

## 2017-07-17 MED ORDER — PHENDIMETRAZINE TARTRATE 35 MG PO TABS: 1.0000 | ORAL_TABLET | Freq: Every day | ORAL | 0 refills | Status: DC

## 2017-07-17 MED ORDER — AMOXICILLIN 875 MG PO TABS: 875.0000 mg | ORAL_TABLET | Freq: Two times a day (BID) | ORAL | 0 refills | Status: DC

## 2017-07-17 NOTE — Telephone Encounter (Signed)
Pt is coming in 07/17/17 will notify at appt

## 2017-07-17 NOTE — Telephone Encounter (Signed)
Call out Bontril 1 tablet daily.  Have her take this 1 hour before breakfast.  Per her cardiologist she has a history of palpitations and this medication can cause palpitations.  Thus if she gets chest pain or palpitations she will need to stop this immediately.  Avoid a lot of caffeine as well since that can aggravate palpitations.  Obviously this needs to be used in conjunction with regular exercise cutting out calories working on diet changes.  In the meantime have her check her insurance for coverage for weight loss medicines that can be used longer term such as Contrave, Saxenda, Qsymia.    Have her follow-up in 1 month on this medication

## 2017-07-17 NOTE — Patient Instructions (Signed)
Call local pharmacy for cash price for Qsymia and Saxenda and [employer]

## 2017-07-17 NOTE — Progress Notes (Signed)
Subjective:  Kristy [name] is a 46 y.o. female who presents for possible sinus infection.  She reports about a week history of sinus pressure, postnasal drainage, runny nose, congestion, sneezing, and not feeling well.  In the last few days she has developed tooth pain at this point as well.  She does have a cracked right upper molar that she is waiting to see her dentist f  here to discuss weight loss medication.  She had called in recently requesting a specific weight loss medicine that she tried from her friend.  She reports her main concern is too strong of an appetite, wants an appetite suppressant.  Her prior experience has been side effects from phentermine.  She is exercising, trying to eat healthy, trying to work on weight loss already.    She has a history of palpitations, but no current problems with this, no chest pain or shortness of breath no edema.  No other aggravating or relieving factors.  No other c/o.  Past Medical History:  Diagnosis Date  . BRCA negative 01/2013  . Dysrhythmia    a. 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 H. pylori infection 10/2010  . Iron deficiency anemia, unspecified   . Obesity   . Seasonal allergies    ragweed, tree pollen, mild and dust per testing 08/2012  . Unspecified vitamin D deficiency    Current Outpatient Medications on File Prior to Visit  Medication Sig Dispense Refill  . clindamycin (CLEOCIN) 2 % vaginal cream Place 1 Applicatorful vaginally at bedtime. 40 g 0   No current facility-administered medications on file prior to visit.     ROS as in subjective   Objective: BP 132/80   Pulse 65   Temp 98.5 F (36.9 C)   Wt 224 lb 6.4 oz (101.8 kg)   SpO2 99%   BMI 37.34 kg/m   General appearance: Alert, WD/WN, no distress                             Skin: warm, no rash                           Head: + Frontal sinus tenderness,                            Eyes: conjunctiva normal, corneas clear, PERRLA             Ears: pearly TMs, external ear canals normal                          Nose: septum midline, turbinates swollen significantly, with erythema and clear discharge             Mouth/throat: MMM, tongue normal, mild pharyngeal erythema, right upper molar with a cracked tooth                           Neck: supple, no adenopathy, no thyromegaly, non tender                          Heart: RRR, normal S1, S2, no murmurs                         Lungs: CTA bilaterally, no wheezes, rales,  or rhonchi        Assessment  Encounter Diagnoses  Name Primary?  . Acute sinusitis, recurrence not specified, unspecified location Yes  . Obesity with serious comorbidity, unspecified classification, unspecified obesity type       Plan: sinusitis-Begin amoxicillin, discussed supportive care, hydration, rest, saltwater gargles, nasal saline flush, can use short-term Afrin sparingly as discussed.  Call or recheck if not improving within the next 4-5 days  Obesity-counseled on diet, exercise, weight loss goals, medication options when patient called in a few weeks ago about.  Medication options, I had contacted her cardiologist for their recommendations on the safety of the medication options she was requesting.  We will begin a trial of Bontril once daily.  We discussed risk and benefits, limited use.  She will use this is part of a comprehensive diet and exercise program.  We also discussed her looking into cash pricing of Saxenda versus Q symia versus Contrave with local pharmacies.  Her insurance does not cover any of those weight loss medications.  her cardiologist advised counseling in regards to possible palpitations with Bontril particularly with caffeine.  Follow-up in 1 month , sooner as needed   Kristy [name] was seen today for sinus problem.  Diagnoses and all orders for this visit:  Acute sinusitis, recurrence not specified, unspecified location  Obesity with serious comorbidity, unspecified  classification, unspecified obesity type  Other orders -     amoxicillin (AMOXIL) 875 MG tablet; Take 1 tablet (875 mg total) by mouth 2 (two) times daily. -     Phendimetrazine Tartrate (BONTRIL PDM) 35 MG TABS; Take 1 tablet (35 mg total) by mouth daily.

## 2017-07-21 ENCOUNTER — Telehealth: Payer: Self-pay | Admitting: Medical

## 2017-07-21 NOTE — Telephone Encounter (Signed)
P.A. PHENDIMETRAZINE TARTRATE initiated

## 2017-07-27 NOTE — Telephone Encounter (Signed)
P.A. Approved til 07/20/18

## 2017-07-29 NOTE — Telephone Encounter (Signed)
Pt informed also limited to 12 weeks of therapy a year

## 2017-10-15 ENCOUNTER — Ambulatory Visit (INDEPENDENT_AMBULATORY_CARE_PROVIDER_SITE_OTHER): Payer: BLUE CROSS/BLUE SHIELD | Admitting: Obstetrics and Gynecology

## 2017-10-15 ENCOUNTER — Encounter: Payer: Self-pay | Admitting: Obstetrics and Gynecology

## 2017-10-15 VITALS — BP 140/98 | HR 72 | Ht 65.0 in | Wt 226.0 lb

## 2017-10-15 DIAGNOSIS — Z803 Family history of malignant neoplasm of breast: Secondary | ICD-10-CM | POA: Diagnosis not present

## 2017-10-15 DIAGNOSIS — Z01419 Encounter for gynecological examination (general) (routine) without abnormal findings: Secondary | ICD-10-CM

## 2017-10-15 DIAGNOSIS — R030 Elevated blood-pressure reading, without diagnosis of hypertension: Secondary | ICD-10-CM | POA: Diagnosis not present

## 2017-10-15 DIAGNOSIS — B9689 Other specified bacterial agents as the cause of diseases classified elsewhere: Secondary | ICD-10-CM | POA: Diagnosis not present

## 2017-10-15 DIAGNOSIS — Z1321 Encounter for screening for nutritional disorder: Secondary | ICD-10-CM

## 2017-10-15 DIAGNOSIS — Z131 Encounter for screening for diabetes mellitus: Secondary | ICD-10-CM

## 2017-10-15 DIAGNOSIS — Z1239 Encounter for other screening for malignant neoplasm of breast: Secondary | ICD-10-CM

## 2017-10-15 DIAGNOSIS — N760 Acute vaginitis: Secondary | ICD-10-CM

## 2017-10-15 DIAGNOSIS — Z13220 Encounter for screening for lipoid disorders: Secondary | ICD-10-CM

## 2017-10-15 DIAGNOSIS — Z0000 Encounter for general adult medical examination without abnormal findings: Secondary | ICD-10-CM | POA: Diagnosis not present

## 2017-10-15 DIAGNOSIS — N644 Mastodynia: Secondary | ICD-10-CM | POA: Diagnosis not present

## 2017-10-15 DIAGNOSIS — Z1231 Encounter for screening mammogram for malignant neoplasm of breast: Secondary | ICD-10-CM

## 2017-10-15 DIAGNOSIS — R3121 Asymptomatic microscopic hematuria: Secondary | ICD-10-CM

## 2017-10-15 DIAGNOSIS — Z808 Family history of malignant neoplasm of other organs or systems: Secondary | ICD-10-CM | POA: Diagnosis not present

## 2017-10-15 LAB — POCT URINALYSIS DIPSTICK
Bilirubin, UA: NEGATIVE
Glucose, UA: NEGATIVE
KETONES UA: NEGATIVE
LEUKOCYTES UA: NEGATIVE
NITRITE UA: NEGATIVE
PH UA: 6 (ref 5.0–8.0)
PROTEIN UA: NEGATIVE
SPEC GRAV UA: 1.015 (ref 1.010–1.025)

## 2017-10-15 NOTE — Progress Notes (Addendum)
HPI:      [name] is a 47 y.o. [phone] who LMP was No LMP recorded (lmp unknown). Patient has had an ablation., presents today for her annual examination. Her menses are absent after novasure ablation. She does not have intermenstrual bleeding.  Sex activity: single partner, contraception - tubal ligation.  Last Pap: April 06, 2015  Results were: no abnormalities /neg HPV DNA  Hx of STDs: none  She has a hx of BV and AV by One Swab 1/18. She had had recurrent BV sx all 2017 and 2018. Pt did cleocin tx weekly for 3 months 1/18 with sx relief. She has treated with flagyl episodically and cleocin as maintenance. No recent sx since not sex active. Not taking probiotics/doesn't use condoms.  No sx today.   Last mammogram: April 06, 2015  Results were: normal--routine follow-up in 12 months. Didn't do mammo last yr. There is a FH of breast cancer in her mom and MGM. There is no FH of ovarian cancer. The patient does do self-breast exams. Pt was BRCA neg 5/14, IBIS=25.6%. Declined MyRisk update testing last 2 yrs. Pt still has LT breast lump for several yrs but not seen on last mammogram. Lump is tender at times; she drinks caffeine.   Tobacco use: The patient denies current or previous tobacco use. Alcohol use: social drinker Exercise: moderately active  She does get adequate calcium but not Vitamin D in her diet.  Hx of hematuria last yr. Saw PCP and urology with neg eval. Wants urine checked today. Doesn't see blood in urine.   Past Medical History:  Diagnosis Date  . BRCA negative 11/2012  . Dysrhythmia    a. 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 Family history of breast cancer   . H. pylori infection 10/2010  . Increased risk of breast cancer 11/2012   IBIS=25.6%  . Iron deficiency anemia, unspecified   . Obesity   . Seasonal allergies    ragweed, tree pollen, mild and dust per testing 08/2012  . Unspecified vitamin D deficiency     Past Surgical  History:  Procedure Laterality Date  . CESAREAN SECTION    . [place] N/A 09/14/2015   Procedure: DILATATION & CURETTAGE/HYSTEROSCOPY WITH NOVASURE ABLATION;  Surgeon: Gae Dry, MD;  Location: [facility] OR[facility];  Service: Gynecology;  Laterality: N/A;  . IUD REMOVAL N/A 09/14/2015   Procedure: INTRAUTERINE DEVICE (IUD) REMOVAL;  Surgeon: Gae Dry, MD;  Location: [facility] OR[facility];  Service: Gynecology;  Laterality: N/A;  . LAPAROSCOPIC BILATERAL SALPINGECTOMY Bilateral 09/14/2015   Procedure: LAPAROSCOPIC BILATERAL SALPINGECTOMY;  Surgeon: Gae Dry, MD;  Location: [facility];  Service: Gynecology;  Laterality: Bilateral;  . TUBAL LIGATION Bilateral 09/14/2015   Procedure: BILATERAL TUBAL LIGATION;  Surgeon: Gae Dry, MD;  Location: [facility];  Service: Gynecology;  Laterality: Bilateral;  . VULVAR LESION REMOVAL N/A 09/14/2015   Procedure: VULVAR LESION;  Surgeon: Gae Dry, MD;  Location: [facility];  Service: Gynecology;  Laterality: N/A;  . WISDOM TOOTH EXTRACTION      Family History  Problem Relation Age of Onset  . Heart disease Mother 59       stent, CAD  . Breast cancer Mother 67       with recurrence about 78  . Cancer Father        esophageal cancer  . Heart disease Father 85       died of MI  .  Alcohol abuse Father   . Hypertension Sister   . Hypertension Brother   . CAD Brother        MI at age 53  . Hypertension Brother   . Breast cancer Maternal Grandmother 56  . Diabetes Neg Hx   . Stroke Neg Hx     Social History   Socioeconomic History  . Marital status: Single    Spouse name: Not on file  . Number of children: Not on file  . Years of education: Not on file  . Highest education level: Not on file  Occupational History  . Not on file  Social Needs  . Financial resource strain: Not on file  . Food insecurity:    Worry: Not on file    Inability: Not on file  . Transportation needs:    Medical: Not on  file    Non-medical: Not on file  Tobacco Use  . Smoking status: Never Smoker  . Smokeless tobacco: Never Used  Substance and Sexual Activity  . Alcohol use: No    Comment: maybe one drink a year  . Drug use: No  . Sexual activity: Yes    Birth control/protection: Surgical    Comment: reports ablation and partner had vasectomy.   Lifestyle  . Physical activity:    Days per week: Not on file    Minutes per session: Not on file  . Stress: Not on file  Relationships  . Social connections:    Talks on phone: Not on file    Gets together: Not on file    Attends religious service: Not on file    Active member of club or organization: Not on file    Attends meetings of clubs or organizations: Not on file    Relationship status: Not on file  . Intimate partner violence:    Fear of current or ex partner: Not on file    Emotionally abused: Not on file    Physically abused: Not on file    Forced sexual activity: Not on file  Other Topics Concern  . Not on file  Social History Narrative   In relationship x 6 mo, divorced, 1 child age 62 yo, exercise with classes, running, works as a Probation officer    Current Outpatient Medications on File Prior to Visit  Medication Sig Dispense Refill  . amoxicillin (AMOXIL) 875 MG tablet Take 1 tablet (875 mg total) by mouth 2 (two) times daily. (Patient not taking: Reported on 10/15/2017) 20 tablet 0  . clindamycin (CLEOCIN) 2 % vaginal cream Place 1 Applicatorful vaginally at bedtime. (Patient not taking: Reported on 10/15/2017) 40 g 0  . Phendimetrazine Tartrate (BONTRIL PDM) 35 MG TABS Take 1 tablet (35 mg total) by mouth daily. (Patient not taking: Reported on 10/15/2017) 30 each 0   No current facility-administered medications on file prior to visit.      ROS:  Review of Systems  Constitutional: Negative for fatigue, fever and unexpected weight change.  Respiratory: Negative for cough, shortness of breath and wheezing.   Cardiovascular: Negative  for chest pain, palpitations and leg swelling.  Gastrointestinal: Negative for blood in stool, constipation, diarrhea, nausea and vomiting.  Endocrine: Negative for cold intolerance, heat intolerance and polyuria.  Genitourinary: Positive for vaginal discharge. Negative for dyspareunia, dysuria, flank pain, frequency, genital sores, hematuria, menstrual problem, pelvic pain, urgency, vaginal bleeding and vaginal pain.  Musculoskeletal: Negative for back pain, joint swelling and myalgias.  Skin: Negative for rash.  Neurological: Negative  for dizziness, syncope, light-headedness, numbness and headaches.  Hematological: Negative for adenopathy.  Psychiatric/Behavioral: Negative for agitation, confusion, sleep disturbance and suicidal ideas. The patient is not nervous/anxious.   BREAST: lump and tenderness   Objective: BP (!) 140/98   Pulse 72   Ht 5' 5"  (1.651 m)   Wt 226 lb (102.5 kg)   LMP  (LMP Unknown)   BMI 37.61 kg/m    Physical Exam  Constitutional: She is oriented to person, place, and time. She appears well-developed and well-nourished.  Genitourinary: Vagina normal and uterus normal. There is no rash or tenderness on the right labia. There is no rash or tenderness on the left labia. No erythema or tenderness in the vagina. No vaginal discharge found. Right adnexum does not display mass and does not display tenderness. Left adnexum does not display mass and does not display tenderness. Cervix does not exhibit motion tenderness or polyp. Uterus is not enlarged or tender.  Neck: Normal range of motion. No thyromegaly present.  Cardiovascular: Normal rate, regular rhythm and normal heart sounds.  No murmur heard. Pulmonary/Chest: Effort normal and breath sounds normal. Right breast exhibits no mass, no nipple discharge, no skin change and no tenderness. Left breast exhibits no mass, no nipple discharge, no skin change and no tenderness.  LT BREAST PROMINENT TISSUE WITHOUT DISCRETE  MASS; TENDER TO PALPATE    Abdominal: Soft. There is no tenderness. There is no guarding.  Musculoskeletal: Normal range of motion.  Neurological: She is alert and oriented to person, place, and time. No cranial nerve deficit.  Psychiatric: She has a normal mood and affect. Her behavior is normal.  Vitals reviewed.   Results: Results for orders placed or performed in visit on 10/15/17 (from the past 24 hour(s))  POCT Urinalysis Dipstick     Status: Abnormal   Collection Time: 10/15/17 11:23 AM  Result Value Ref Range   Color, UA YELLOW    Clarity, UA CLEAR    Glucose, UA NEG    Bilirubin, UA neg    Ketones, UA neg    Spec Grav, UA 1.015 1.010 - 1.025   Blood, UA small    pH, UA 6.0 5.0 - 8.0   Protein, UA neg    Urobilinogen, UA  0.2 or 1.0 E.U./dL   Nitrite, UA neg    Leukocytes, UA Negative Negative   Appearance     Odor      Assessment/Plan: Encounter for annual routine gynecological examination  Screening for breast cancer - Pt to sched mammo. - Plan: MM SCREENING BREAST TOMO BILATERAL  Family history of breast cancer - BRCA neg. MyRisk update testing discussed and done today. RTO in 6 wks for results f/u. - Plan: MM SCREENING BREAST TOMO BILATERAL, Integrated BRACAnalysis  Blood tests for routine general physical examination - Plan: Comprehensive metabolic panel, Lipid panel, Hemoglobin A1c, VITAMIN D 25 Hydroxy (Vit-D Deficiency, Fractures)  Screening cholesterol level - Plan: Lipid panel  Screening for diabetes mellitus - Plan: Hemoglobin A1c  Encounter for vitamin deficiency screening - Plan: VITAMIN D 25 Hydroxy (Vit-D Deficiency, Fractures)  Asymptomatic microscopic hematuria - Pos dip today. Neg eval with urology. Check CMP. Follow expectantly. - Plan: POCT Urinalysis Dipstick  Breast tenderness in female - Lt breast, neg exam for mass. Prominent tissue. Check scr mammo anyway. D/C caffeine.  BV (bacterial vaginosis) - Recurrent sx, none today. Sx improved  since not recently sex active. Add probiotics, dove sens skin soap, condoms as prevention. F/u prn sx for  Rx RF.  Elevated blood-pressure reading without diagnosis of hypertension - Rechk at 6 wk MyRisk results f/u. Pt has been rushing today.        GYN counsel breast self exam, mammography screening, adequate intake of calcium and vitamin D, diet and exercise     F/U  Return in about 6 weeks (around 11/26/2017) for [facility] results f/u.  Lenetta Piche B. Keyshawn Hellwig, PA-C 10/15/2017 11:46 AM

## 2017-10-15 NOTE — Patient Instructions (Addendum)
I value your feedback and entrusting us with your care. If you get a [place] patient survey, I would appreciate you taking the time to let us know about your experience today. Thank you!  [facility]: [phone]    

## 2017-10-16 ENCOUNTER — Encounter: Payer: Self-pay | Admitting: Obstetrics and Gynecology

## 2017-10-16 LAB — COMPREHENSIVE METABOLIC PANEL
ALT: 14 IU/L (ref 0–32)
AST: 20 IU/L (ref 0–40)
Albumin/Globulin Ratio: 1.2 (ref 1.2–2.2)
Albumin: 3.6 g/dL (ref 3.5–5.5)
Alkaline Phosphatase: 45 IU/L (ref 39–117)
BUN/Creatinine Ratio: 9 (ref 9–23)
BUN: 8 mg/dL (ref 6–24)
Bilirubin Total: <0.2 mg/dL (ref 0.0–1.2)
CALCIUM: 8.5 mg/dL — AB (ref 8.7–10.2)
CO2: 24 mmol/L (ref 20–29)
CREATININE: 0.87 mg/dL (ref 0.57–1.00)
Chloride: 104 mmol/L (ref 96–106)
GFR, EST AFRICAN AMERICAN: 93 mL/min/{1.73_m2} (ref >59)
GFR, EST NON AFRICAN AMERICAN: 81 mL/min/{1.73_m2} (ref >59)
GLUCOSE: 93 mg/dL (ref 65–99)
Globulin, Total: 2.9 g/dL (ref 1.5–4.5)
Potassium: 4.3 mmol/L (ref 3.5–5.2)
Sodium: 141 mmol/L (ref 134–144)
TOTAL PROTEIN: 6.5 g/dL (ref 6.0–8.5)

## 2017-10-16 LAB — LIPID PANEL
CHOL/HDL RATIO: 3 ratio (ref 0.0–4.4)
Cholesterol, Total: 205 mg/dL — ABNORMAL HIGH (ref 100–199)
HDL: 68 mg/dL (ref >39)
LDL CALC: 126 mg/dL — AB (ref 0–99)
TRIGLYCERIDES: 56 mg/dL (ref 0–149)
VLDL CHOLESTEROL CAL: 11 mg/dL (ref 5–40)

## 2017-10-16 LAB — HEMOGLOBIN A1C
Est. average glucose Bld gHb Est-mCnc: 114 mg/dL
HEMOGLOBIN A1C: 5.6 % (ref 4.8–5.6)

## 2017-10-16 LAB — VITAMIN D 25 HYDROXY (VIT D DEFICIENCY, FRACTURES): Vit D, 25-Hydroxy: 15.3 ng/mL — ABNORMAL LOW (ref 30.0–100.0)

## 2017-10-28 ENCOUNTER — Other Ambulatory Visit: Payer: Self-pay | Admitting: Obstetrics and Gynecology

## 2017-10-28 ENCOUNTER — Ambulatory Visit
Admission: RE | Admit: 2017-10-28 | Discharge: 2017-10-28 | Disposition: A | Discharge status: Home / Self Care | Payer: BLUE CROSS/BLUE SHIELD | Source: Ambulatory Visit | Attending: Obstetrics and Gynecology | Admitting: Obstetrics and Gynecology

## 2017-10-28 DIAGNOSIS — Z1239 Encounter for other screening for malignant neoplasm of breast: Secondary | ICD-10-CM

## 2017-10-28 DIAGNOSIS — Z803 Family history of malignant neoplasm of breast: Secondary | ICD-10-CM

## 2017-10-28 DIAGNOSIS — N6320 Unspecified lump in the left breast, unspecified quadrant: Secondary | ICD-10-CM

## 2017-10-29 ENCOUNTER — Inpatient Hospital Stay
Admission: RE | Admit: 2017-10-29 | Discharge: 2017-10-29 | Disposition: A | Discharge status: Home / Self Care | Payer: Self-pay | Source: Ambulatory Visit | Attending: *Deleted | Admitting: *Deleted

## 2017-10-29 ENCOUNTER — Other Ambulatory Visit: Payer: Self-pay | Admitting: *Deleted

## 2017-10-29 DIAGNOSIS — Z9289 Personal history of other medical treatment: Secondary | ICD-10-CM

## 2017-11-04 ENCOUNTER — Encounter: Payer: Self-pay | Admitting: Obstetrics and Gynecology

## 2017-11-11 ENCOUNTER — Encounter: Payer: Self-pay | Admitting: Medical

## 2017-11-11 ENCOUNTER — Ambulatory Visit: Payer: BLUE CROSS/BLUE SHIELD | Admitting: Medical

## 2017-11-11 VITALS — BP 130/92 | HR 88 | Ht 64.75 in | Wt 220.6 lb

## 2017-11-11 DIAGNOSIS — R030 Elevated blood-pressure reading, without diagnosis of hypertension: Secondary | ICD-10-CM | POA: Diagnosis not present

## 2017-11-11 DIAGNOSIS — J301 Allergic rhinitis due to pollen: Secondary | ICD-10-CM | POA: Diagnosis not present

## 2017-11-11 DIAGNOSIS — G4489 Other headache syndrome: Secondary | ICD-10-CM | POA: Diagnosis not present

## 2017-11-11 MED ORDER — PHENDIMETRAZINE TARTRATE 35 MG PO TABS: 1.0000 | ORAL_TABLET | Freq: Every day | ORAL | 1 refills | Status: DC

## 2017-11-11 NOTE — Patient Instructions (Signed)
Recommendations:  I suspect your headache is allergy related  Stop tylenol sinus, but instead use OTC antihistamine such as Zyrtec or Allegra or Benadryl at night the next several day  You can use plain tylenol for pain  Make sure you are drinking plenty of water  Wash your body and consider nasal saline flush daily given all the pollen in the air currently  If not improving this week, call back  Monitor your blood pressure 2-3 days per week for a few weeks.   Goal is 120/70. However, if you see consistent numbers 140/90 or higher, then we need to recheck and discuss

## 2017-11-11 NOTE — Progress Notes (Signed)
Subjective: Chief Complaint  Patient presents with  . Headache    x2 days   Here for 2 day hx/o headaches.  Headache is frontal and some in occipital as well.  Started in front of head.  Pain has been combination of different types of pain.  Has had some throbbing.   Took 2 tylenol sinus yesterday.   Was lessened this morning.   Has had some nausea, but no vomiting.   No fever.   Has had some sneezing, some itchy eyes.   No blurred vision, no dizziness unless moves quickly.   Not prone to getting headaches.    She also wants prescription for weight loss medication that got approved.   She notes we had discussed at prior visit recently.    Past Medical History:  Diagnosis Date  . BRCA negative 11/2012, 4/19   BRCA neg 2014; MyRisk update neg except POLE VUS 4/19  . Dysrhythmia    a. 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 Family history of breast cancer   . H. pylori infection 10/2010  . Increased risk of breast cancer 11/2012; 4/19   5/14 IBIS=25.6%; 4/19 IBIS=25.8%  . Iron deficiency anemia, unspecified   . Obesity   . Seasonal allergies    ragweed, tree pollen, mild and dust per testing 08/2012  . Unspecified vitamin D deficiency   . Vitamin D deficiency 2017   No current outpatient medications on file prior to visit.   No current facility-administered medications on file prior to visit.    ROS as in subjective   Objective: BP (!) 130/92   Pulse 88   Ht 5' 4.75" (1.645 m)   Wt 220 lb 9.6 oz (100.1 kg)   LMP  (LMP Unknown)   SpO2 97%   BMI 36.99 kg/m   Wt Readings from Last 3 Encounters:  11/11/17 220 lb 9.6 oz (100.1 kg)  10/15/17 226 lb (102.5 kg)  07/17/17 224 lb 6.4 oz (101.8 kg)   BP Readings from Last 3 Encounters:  11/11/17 (!) 130/92  10/15/17 (!) 140/98  07/17/17 132/80   General appearance: alert, no distress, WD/WN,  HEENT: normocephalic, sclerae anicteric, mild conjunctival injection, PERRLA, EOMi, nares patent, no discharge or erythema, pharynx  with mild post nasal drainage Oral cavity: MMM, no lesions Neck: supple, no lymphadenopathy, no thyromegaly, no masses Heart: RRR, normal S1, S2, no murmurs Lungs: CTA bilaterally, no wheezes, rhonchi, or rales Extremities: no edema, no cyanosis, no clubbing Pulses: 2+ symmetric, upper and lower extremities, normal cap refill Neurological: alert, oriented x 3, CN2-12 intact, strength normal upper extremities and lower extremities, sensation normal throughout, DTRs 2+ throughout, no cerebellar signs, gait normal Psychiatric: normal affect, behavior normal, pleasant     Assessment: Encounter Diagnoses  Name Primary?  . Headache syndrome Yes  . Allergic rhinitis due to pollen, unspecified seasonality   . Elevated blood-pressure reading without diagnosis of hypertension      Plan: I suspect current headache related to sinus pressure and allergies.    discussed recommendations below  Patient Instructions  Recommendations:  I suspect your headache is allergy related  Stop tylenol sinus, but instead use OTC antihistamine such as Zyrtec or Allegra or Benadryl at night the next several day  You can use plain tylenol for pain  Make sure you are drinking plenty of water  Wash your body and consider nasal saline flush daily given all the pollen in the air currently  If not improving this week, call back  Monitor your blood pressure 2-3 days per week for a few weeks.   Goal is 120/70. However, if you see consistent numbers 140/90 or higher, then we need to recheck and discuss   obesity - counseled on diet, exercise, goal setting, f/u.   Begin trial of medication below.    Karlena was seen today for headache.  Diagnoses and all orders for this visit:  Headache syndrome  Allergic rhinitis due to pollen, unspecified seasonality  Elevated blood-pressure reading without diagnosis of hypertension  Other orders -     Phendimetrazine Tartrate (BONTRIL PDM) 35 MG TABS; Take 1 tablet  (35 mg total) by mouth daily.

## 2017-11-12 ENCOUNTER — Ambulatory Visit
Admission: RE | Admit: 2017-11-12 | Discharge: 2017-11-12 | Disposition: A | Discharge status: Home / Self Care | Payer: BLUE CROSS/BLUE SHIELD | Source: Ambulatory Visit | Attending: Obstetrics and Gynecology | Admitting: Obstetrics and Gynecology

## 2017-11-12 ENCOUNTER — Encounter: Payer: Self-pay | Admitting: Obstetrics and Gynecology

## 2017-11-12 DIAGNOSIS — N6320 Unspecified lump in the left breast, unspecified quadrant: Secondary | ICD-10-CM

## 2017-11-12 DIAGNOSIS — N6321 Unspecified lump in the left breast, upper outer quadrant: Secondary | ICD-10-CM | POA: Insufficient documentation

## 2017-11-12 DIAGNOSIS — N6489 Other specified disorders of breast: Secondary | ICD-10-CM | POA: Diagnosis not present

## 2017-11-12 DIAGNOSIS — R928 Other abnormal and inconclusive findings on diagnostic imaging of breast: Secondary | ICD-10-CM | POA: Diagnosis not present

## 2017-11-26 ENCOUNTER — Encounter: Payer: Self-pay | Admitting: Obstetrics and Gynecology

## 2017-11-26 ENCOUNTER — Ambulatory Visit: Payer: BLUE CROSS/BLUE SHIELD | Admitting: Obstetrics and Gynecology

## 2017-11-26 ENCOUNTER — Ambulatory Visit (INDEPENDENT_AMBULATORY_CARE_PROVIDER_SITE_OTHER): Payer: BLUE CROSS/BLUE SHIELD | Admitting: Obstetrics and Gynecology

## 2017-11-26 VITALS — BP 134/90 | HR 91 | Ht 65.0 in | Wt 223.0 lb

## 2017-11-26 DIAGNOSIS — Z9189 Other specified personal risk factors, not elsewhere classified: Secondary | ICD-10-CM

## 2017-11-26 DIAGNOSIS — Z0130 Encounter for examination of blood pressure without abnormal findings: Secondary | ICD-10-CM | POA: Diagnosis not present

## 2017-11-26 DIAGNOSIS — N898 Other specified noninflammatory disorders of vagina: Secondary | ICD-10-CM

## 2017-11-26 DIAGNOSIS — Z7183 Encounter for nonprocreative genetic counseling: Secondary | ICD-10-CM | POA: Diagnosis not present

## 2017-11-26 DIAGNOSIS — Z803 Family history of malignant neoplasm of breast: Secondary | ICD-10-CM | POA: Diagnosis not present

## 2017-11-26 DIAGNOSIS — Z1371 Encounter for nonprocreative screening for genetic disease carrier status: Secondary | ICD-10-CM

## 2017-11-26 LAB — POCT WET PREP WITH KOH
Clue Cells Wet Prep HPF POC: NEGATIVE
KOH Prep POC: NEGATIVE
Trichomonas, UA: NEGATIVE
Yeast Wet Prep HPF POC: NEGATIVE

## 2017-11-26 MED ORDER — MOXIFLOXACIN HCL 400 MG PO TABS: 400.0000 mg | ORAL_TABLET | Freq: Every day | ORAL | 0 refills | Status: AC

## 2017-11-26 NOTE — Patient Instructions (Signed)
I value your feedback and entrusting us with your care. If you get a [facility] patient survey, I would appreciate you taking the time to let us know about your experience today. Thank you! 

## 2017-11-26 NOTE — Progress Notes (Signed)
Tysinger, Camelia Eng, PA-C   Chief Complaint  Patient presents with  . Follow-up    MyRisk results   . Gynecologic Exam    vaginitis sx    HPI:      [name] is a 47 y.o. [id number] who LMP was No LMP recorded. Patient has had an ablation., presents today for [facility] results f/u and vaginal sx.   MyRisk Results are negative for a cancer genetic mutation except POLE VUS.  IBIS=25.8% Riskscore=N/A Last mammogram=11/12/17 Vitamin D status=15.3 on 10/15/17; taking supp  Pt also complains of continued increased d/c with irritation, no odor. Hx of AV and BV in the past. Has changed to dove sens skin soap, no dryer sheets. Has restarted probiotics. Is sex active again, using condoms.   Pt also had increased BP at 10/15/17 annual. Rechk today.  Past Medical History:  Diagnosis Date  . BRCA negative 11/2012, 4/19   BRCA neg 2014; MyRisk update neg except POLE VUS 4/19  . Dysrhythmia    a. 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 Family history of breast cancer   . H. pylori infection 10/2010  . Increased risk of breast cancer 11/2012; 4/19   5/14 IBIS=25.6%; 4/19 IBIS=25.8%  . Iron deficiency anemia, unspecified   . Obesity   . Seasonal allergies    ragweed, tree pollen, mild and dust per testing 08/2012  . Unspecified vitamin D deficiency   . Vitamin D deficiency 2017    Past Surgical History:  Procedure Laterality Date  . BREAST BIOPSY Right 1980s   neg  . CESAREAN SECTION    . [place] N/A 09/14/2015   Procedure: DILATATION & CURETTAGE/HYSTEROSCOPY WITH NOVASURE ABLATION;  Surgeon: Gae Dry, MD;  Location: [facility];  Service: Gynecology;  Laterality: N/A;  . IUD REMOVAL N/A 09/14/2015   Procedure: INTRAUTERINE DEVICE (IUD) REMOVAL;  Surgeon: Gae Dry, MD;  Location: [facility];  Service: Gynecology;  Laterality: N/A;  . LAPAROSCOPIC BILATERAL SALPINGECTOMY Bilateral 09/14/2015   Procedure: LAPAROSCOPIC  BILATERAL SALPINGECTOMY;  Surgeon: Gae Dry, MD;  Location: [facility];  Service: Gynecology;  Laterality: Bilateral;  . TUBAL LIGATION Bilateral 09/14/2015   Procedure: BILATERAL TUBAL LIGATION;  Surgeon: Gae Dry, MD;  Location: [facility];  Service: Gynecology;  Laterality: Bilateral;  . VULVAR LESION REMOVAL N/A 09/14/2015   Procedure: VULVAR LESION;  Surgeon: Gae Dry, MD;  Location: [facility];  Service: Gynecology;  Laterality: N/A;  . WISDOM TOOTH EXTRACTION      Family History  Problem Relation Age of Onset  . Heart disease Mother 60       stent, CAD  . Breast cancer Mother 14       with recurrence about 9  . Cancer Father        esophageal cancer  . Heart disease Father 27       died of MI  . Alcohol abuse Father   . Hypertension Sister   . Hypertension Brother   . CAD Brother        MI at age 92  . Hypertension Brother   . Breast cancer Maternal Grandmother 8  . Diabetes Neg Hx   . Stroke Neg Hx     Social History   Socioeconomic History  . Marital status: Single    Spouse name: Not on file  . Number of children: Not on file  . Years of education: Not on file  . Highest  education level: Not on file  Occupational History  . Not on file  Social Needs  . Financial resource strain: Not on file  . Food insecurity:    Worry: Not on file    Inability: Not on file  . Transportation needs:    Medical: Not on file    Non-medical: Not on file  Tobacco Use  . Smoking status: Never Smoker  . Smokeless tobacco: Never Used  Substance and Sexual Activity  . Alcohol use: No    Comment: maybe one drink a year  . Drug use: No  . Sexual activity: Yes    Birth control/protection: Surgical    Comment: reports ablation and partner had vasectomy.   Lifestyle  . Physical activity:    Days per week: Not on file    Minutes per session: Not on file  . Stress: Not on file  Relationships  . Social connections:    Talks on phone: Not on file    Gets together:  Not on file    Attends religious service: Not on file    Active member of club or organization: Not on file    Attends meetings of clubs or organizations: Not on file    Relationship status: Not on file  . Intimate partner violence:    Fear of current or ex partner: Not on file    Emotionally abused: Not on file    Physically abused: Not on file    Forced sexual activity: Not on file  Other Topics Concern  . Not on file  Social History Narrative   In relationship x 6 mo, divorced, 1 child age 58 yo, exercise with classes, running, works as a Probation officer    Outpatient Medications Prior to Visit  Medication Sig Dispense Refill  . Phendimetrazine Tartrate (BONTRIL PDM) 35 MG TABS Take 1 tablet (35 mg total) by mouth daily. (Patient not taking: Reported on 11/26/2017) 30 each 1   No facility-administered medications prior to visit.       ROS:  Review of Systems  Constitutional: Negative for fever.  Gastrointestinal: Negative for blood in stool, constipation, diarrhea, nausea and vomiting.  Genitourinary: Positive for vaginal discharge. Negative for dyspareunia, dysuria, flank pain, frequency, hematuria, urgency, vaginal bleeding and vaginal pain.  Musculoskeletal: Negative for back pain.  Skin: Negative for rash.  BREAST: No symptoms   OBJECTIVE:   Vitals:  BP 134/90   Pulse 91   Ht '5\' 5"'$  (1.651 m)   Wt 223 lb (101.2 kg)   BMI 37.11 kg/m   Physical Exam  Constitutional: She is oriented to person, place, and time. Vital signs are normal. She appears well-developed.  Pulmonary/Chest: Effort normal.  Genitourinary: Uterus normal. There is no rash, tenderness or lesion on the right labia. There is no rash, tenderness or lesion on the left labia. Uterus is not enlarged and not tender. Cervix exhibits no motion tenderness. Right adnexum displays no mass and no tenderness. Left adnexum displays no mass and no tenderness. No erythema or tenderness in the vagina. Vaginal discharge  found.  Musculoskeletal: Normal range of motion.  Neurological: She is alert and oriented to person, place, and time.  Psychiatric: She has a normal mood and affect. Her behavior is normal. Thought content normal.  Vitals reviewed.   Results: Results for orders placed or performed in visit on 11/26/17 (from the past 24 hour(s))  POCT Wet Prep with KOH     Status: Normal   Collection Time: 11/26/17 10:57 AM  Result Value Ref Range   Trichomonas, UA Negative    Clue Cells Wet Prep HPF POC neg    Epithelial Wet Prep HPF POC  Few, Moderate, Many, Too numerous to count   Yeast Wet Prep HPF POC neg    Bacteria Wet Prep HPF POC  Few   RBC Wet Prep HPF POC     WBC Wet Prep HPF POC     KOH Prep POC Negative Negative     Assessment/Plan: Vaginal irritation - Neg wet prep, neg exam except d/c. Treat empirically for AV with Rx avelox. F/u if sx persist. Cont probiotics. - Plan: POCT Wet Prep with KOH, moxifloxacin (AVELOX) 400 MG tablet  Blood pressure check - BP ok today. Reassurance. F/u prn.   Family history of breast cancer  BRCA negative  Increased risk of breast cancer - Pt aware of monthly SBE, yearly CBE and mammos, and offered scr br MRI. Pt interested in doing MRI--will call 9/19 to sched. Due by 11/19. Cont Vit D3.  Encounter for nonprocreative genetic counseling  Patient understands these results only apply to her and her children, and this is not indicative of genetic testing results of her other family members. It is recommended that her other family members have genetic testing done.  Pt also understands negative genetic testing doesn't mean she will never get any of these cancers.   Results handout given to pt.    Meds ordered this encounter  Medications  . moxifloxacin (AVELOX) 400 MG tablet    Sig: Take 1 tablet (400 mg total) by mouth daily for 6 days.    Dispense:  6 tablet    Refill:  0    Order Specific Question:   Supervising Provider    Answer:   Gae Dry [[id number]]      Return if symptoms worsen or fail to improve.  Oakley Kossman B. Travone Georg, PA-C 11/26/2017 11:02 AM

## 2017-12-01 ENCOUNTER — Other Ambulatory Visit: Payer: Self-pay | Admitting: Obstetrics and Gynecology

## 2017-12-01 ENCOUNTER — Telehealth: Payer: Self-pay

## 2017-12-01 MED ORDER — FLUCONAZOLE 150 MG PO TABS: 150.0000 mg | ORAL_TABLET | Freq: Once | ORAL | 0 refills | Status: AC

## 2017-12-01 NOTE — Progress Notes (Signed)
Rx after abx

## 2017-12-01 NOTE — Telephone Encounter (Signed)
Rx diflucan eRxd. RN to notify pt.

## 2017-12-01 NOTE — Telephone Encounter (Signed)
Pt calling requesting that [facility] send her in RX for Diflucan. Was on antibiotic and she thinks its giving her a yeast infection. Please advise

## 2017-12-03 NOTE — Telephone Encounter (Signed)
Pt received notification from pharmacy and has p/u med.

## 2018-01-28 ENCOUNTER — Ambulatory Visit: Payer: BLUE CROSS/BLUE SHIELD | Admitting: Medical

## 2018-01-28 VITALS — BP 120/80 | HR 88 | Temp 98.0°F | Resp 16 | Ht 65.0 in | Wt 226.2 lb

## 2018-01-28 DIAGNOSIS — R3129 Other microscopic hematuria: Secondary | ICD-10-CM

## 2018-01-28 DIAGNOSIS — R350 Frequency of micturition: Secondary | ICD-10-CM | POA: Insufficient documentation

## 2018-01-28 DIAGNOSIS — R109 Unspecified abdominal pain: Secondary | ICD-10-CM | POA: Diagnosis not present

## 2018-01-28 LAB — POCT URINALYSIS DIP (PROADVANTAGE DEVICE)
BILIRUBIN UA: NEGATIVE
GLUCOSE UA: NEGATIVE mg/dL
Ketones, POC UA: NEGATIVE mg/dL
Leukocytes, UA: NEGATIVE
Nitrite, UA: NEGATIVE
Protein Ur, POC: NEGATIVE mg/dL
Specific Gravity, Urine: 1.02
UUROB: 3.5
pH, UA: 6 (ref 5.0–8.0)

## 2018-01-28 NOTE — Progress Notes (Signed)
Subjective: Chief Complaint  Patient presents with  . stomach pain    stomach pain X 2 weeks frequancy back pain    Here for lower abdominal pain x 2 weeks.  Denies diarrhea, no constipation, no cramping.   No blood in stool.  No burning with urination, has some polyuria, no urgency, but goes frequently, drinks a lot of water. No fever, no nausea, no vomiting.  Has some mid back pain.  Had endometrial ablation 5 years ago, so no periods.   Thinks she is starting menopausal, having hot flashes.    Having some muscle aches.  No recent heavy lifting.  Last UTI a while ago.   She notes BV 3 mo ago.   No concern for STD.  No other aggravating or relieving factors.   She has prior urology consult for microscopic hematuria in recent years, no specific worrisome cause found  No other complaint.   Past Medical History:  Diagnosis Date  . BRCA negative 11/2012, 4/19   BRCA neg 2014; MyRisk update neg except POLE VUS 4/19  . Dysrhythmia    a. 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 Family history of breast cancer   . H. pylori infection 10/2010  . Increased risk of breast cancer 11/2012; 4/19   5/14 IBIS=25.6%; 4/19 IBIS=25.8%  . Iron deficiency anemia, unspecified   . Obesity   . Seasonal allergies    ragweed, tree pollen, mild and dust per testing 08/2012  . Unspecified vitamin D deficiency   . Vitamin D deficiency 2017   No current outpatient medications on file prior to visit.   No current facility-administered medications on file prior to visit.    ROS as in subjective   Objective: BP 120/80   Pulse 88   Temp 98 F (36.7 C) (Oral)   Resp 16   Ht _0  (1.651 m)   Wt 226 lb 3.2 oz (102.6 kg)   SpO2 98%   BMI 37.64 kg/m   General appearance: alert, no distress, WD/WN,  Abdomen: +bs, soft, mild lower abdominal tenderness, otherwise non tender, non distended, no masses, no hepatomegaly, no splenomegaly Back: nontender, normal ROM Legs nontender, normal ROM of hips without  pain, no edema Pulses: 2+ symmetric, upper and lower extremities, normal cap refill Pelvic exam declined   Assessment: Encounter Diagnoses  Name Primary?  . Abdominal discomfort Yes  . Frequency of urination   . Microscopic hematuria      Plan: Discussed symptoms and concerns.  Symptoms are vague and etiology unclear.  Doubt musculoskeletal issue or muscle strain given normal musculoskeletal exam without reproducing symptoms.  Urine culture sent  I reviewed her baseline labs from April 2018 including compress a metabolic panel and urine testing.  Offered pelvic exam and STD screen and wet prep, but she declines for now.  No major concern for STD.  Follow-up pending urine culture or if symptoms worsen  Su was seen today for stomach pain.  Diagnoses and all orders for this visit:  Abdominal discomfort  Frequency of urination -     POCT Urinalysis DIP (Proadvantage Device) -     CULTURE, URINE COMPREHENSIVE  Microscopic hematuria

## 2018-01-30 LAB — CULTURE, URINE COMPREHENSIVE

## 2018-04-03 ENCOUNTER — Telehealth: Payer: Self-pay | Admitting: Medical

## 2018-04-03 ENCOUNTER — Encounter: Payer: Self-pay | Admitting: Medical

## 2018-04-03 ENCOUNTER — Ambulatory Visit: Payer: BLUE CROSS/BLUE SHIELD | Admitting: Medical

## 2018-04-03 VITALS — BP 124/80 | HR 77 | Temp 98.1°F | Resp 16 | Ht 65.0 in | Wt 229.4 lb

## 2018-04-03 DIAGNOSIS — Z9851 Tubal ligation status: Secondary | ICD-10-CM | POA: Insufficient documentation

## 2018-04-03 DIAGNOSIS — G479 Sleep disorder, unspecified: Secondary | ICD-10-CM | POA: Insufficient documentation

## 2018-04-03 DIAGNOSIS — R3589 Other polyuria: Secondary | ICD-10-CM | POA: Insufficient documentation

## 2018-04-03 DIAGNOSIS — M549 Dorsalgia, unspecified: Secondary | ICD-10-CM | POA: Insufficient documentation

## 2018-04-03 DIAGNOSIS — R5383 Other fatigue: Secondary | ICD-10-CM

## 2018-04-03 DIAGNOSIS — J3489 Other specified disorders of nose and nasal sinuses: Secondary | ICD-10-CM | POA: Insufficient documentation

## 2018-04-03 DIAGNOSIS — R358 Other polyuria: Secondary | ICD-10-CM | POA: Diagnosis not present

## 2018-04-03 DIAGNOSIS — R7301 Impaired fasting glucose: Secondary | ICD-10-CM | POA: Insufficient documentation

## 2018-04-03 DIAGNOSIS — R3129 Other microscopic hematuria: Secondary | ICD-10-CM

## 2018-04-03 LAB — POCT URINALYSIS DIP (PROADVANTAGE DEVICE)
BILIRUBIN UA: NEGATIVE
BILIRUBIN UA: NEGATIVE mg/dL
Glucose, UA: NEGATIVE mg/dL
Leukocytes, UA: NEGATIVE
Nitrite, UA: NEGATIVE
PH UA: 6 (ref 5.0–8.0)
PROTEIN UA: NEGATIVE mg/dL
Specific Gravity, Urine: 1.03
Urobilinogen, Ur: NEGATIVE

## 2018-04-03 MED ORDER — CETIRIZINE HCL 10 MG PO TABS: 10.0000 mg | ORAL_TABLET | Freq: Every day | ORAL | 2 refills | Status: DC

## 2018-04-03 MED ORDER — OMEPRAZOLE 40 MG PO CPDR: 40.0000 mg | DELAYED_RELEASE_CAPSULE | Freq: Every day | ORAL | 0 refills | Status: DC

## 2018-04-03 MED ORDER — AMOXICILLIN 875 MG PO TABS: 875.0000 mg | ORAL_TABLET | Freq: Two times a day (BID) | ORAL | 0 refills | Status: DC

## 2018-04-03 NOTE — Telephone Encounter (Signed)
Refer to Urology for microscopic hematuria

## 2018-04-03 NOTE — Progress Notes (Signed)
Subjective: Chief Complaint  Patient presents with  . sinus    cough, congestion, fatigue, back pain, arm numbness and leg pain left, hungry    Here for not feeling well. Having a variety of symptoms.  Not sure, wondered about menopause.   She notes pains in left arm and leg, some recent cold sweats, some recent congestion and headache.   Having yellow nasal discharge.    Doesn't feel good in general.  Some back pain.  Awakes sometimes 3am, has some trouble sleeping the past 2-3 weeks.   No new stresors.   Not sure about snoring.  No witnessed apnea.    Denies feeling depression.  PHQ9 screen today +.   Been tired, having trouble losing weight, craving food.  Can't get control of appetite.   Eating sweets at night.    LMP 3 years ago.  Has hx/o ablation and tubal ligation.   Mom started menoapusea about 70.    No other aggravating or relieving factors. No other complaint.   Past Medical History:  Diagnosis Date  . BRCA negative 11/2012, 4/19   BRCA neg 2014; MyRisk update neg except POLE VUS 4/19  . Dysrhythmia    a. 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 Family history of breast cancer   . H. pylori infection 10/2010  . Increased risk of breast cancer 11/2012; 4/19   5/14 IBIS=25.6%; 4/19 IBIS=25.8%  . Iron deficiency anemia, unspecified   . Obesity   . Seasonal allergies    ragweed, tree pollen, mild and dust per testing 08/2012  . Unspecified vitamin D deficiency   . Vitamin D deficiency 2017   No current outpatient medications on file prior to visit.   No current facility-administered medications on file prior to visit.    ROS as in subjective   Objective: BP 124/80   Pulse 77   Temp 98.1 F (36.7 C) (Oral)   Resp 16   Ht 5' 5"  (1.651 m)   Wt 229 lb 6.4 oz (104.1 kg)   SpO2 99%   BMI 38.17 kg/m   Wt Readings from Last 3 Encounters:  04/03/18 229 lb 6.4 oz (104.1 kg)  01/28/18 226 lb 3.2 oz (102.6 kg)  11/26/17 223 lb (101.2 kg)     General appearence:  alert, no distress, WD/WN,  HEENT: normocephalic, sclerae anicteric, mild sinus tenderness, PERRLA, EOMi, nares patent,mild mucoid discharge, + erythema, pharynx normal Oral cavity: MMM, no lesions Neck: supple, no lymphadenopathy, no thyromegaly, no masses Heart: RRR, normal S1, S2, no murmurs Lungs: CTA bilaterally, no wheezes, rhonchi, or rales Abdomen: +bs, soft, mild upper abdominal tenderness, othrwise non tender, non distended, no masses, no hepatomegaly, no splenomegaly Back: non tender Extremities: no edema, no cyanosis, no clubbing Pulses: 2+ symmetric, upper and lower extremities, normal cap refill Neurological: alert, oriented x 3, CN2-12 intact, strength normal upper extremities and lower extremities, sensation normal throughout, DTRs 2+ throughout, no cerebellar signs, gait normal Psychiatric: normal affect, behavior normal, pleasant     Assessment: Encounter Diagnoses  Name Primary?  . Other fatigue Yes  . Sinus pressure   . Sleep disturbance   . Acute right-sided back pain, unspecified back location   . Polyuria   . Impaired fasting blood sugar   . History of tubal ligation   . Microscopic hematuria      Plan:  faituge - etiology unclear, but could be multifactoral  Begin amoxicillin for sinusitis, rest, hdyrate well  consdier sleep study  Referral to urology  for microscopic hematuria, persistent, recent negative urine culture  [place] was seen today for sinus.  Diagnoses and all orders for this visit:  Other fatigue -     TSH -     FSH/LH -     Basic metabolic panel  Sinus pressure  Sleep disturbance -     FSH/LH  Acute right-sided back pain, unspecified back location  Polyuria -     TSH -     FSH/LH -     Basic metabolic panel  Impaired fasting blood sugar  History of tubal ligation  Microscopic hematuria -     Basic metabolic panel -     POCT Urinalysis DIP (Proadvantage Device) -     Ambulatory referral to Urology  Other  orders -     omeprazole (PRILOSEC) 40 MG capsule; Take 1 capsule (40 mg total) by mouth daily. -     amoxicillin (AMOXIL) 875 MG tablet; Take 1 tablet (875 mg total) by mouth 2 (two) times daily. -     cetirizine (ZYRTEC) 10 MG tablet; Take 1 tablet (10 mg total) by mouth daily.

## 2018-04-04 LAB — BASIC METABOLIC PANEL
BUN / CREAT RATIO: 12 (ref 9–23)
BUN: 12 mg/dL (ref 6–24)
CHLORIDE: 103 mmol/L (ref 96–106)
CO2: 21 mmol/L (ref 20–29)
Calcium: 9 mg/dL (ref 8.7–10.2)
Creatinine, Ser: 0.97 mg/dL (ref 0.57–1.00)
GFR calc Af Amer: 82 mL/min/{1.73_m2} (ref >59)
GFR calc non Af Amer: 71 mL/min/{1.73_m2} (ref >59)
GLUCOSE: 70 mg/dL (ref 65–99)
Potassium: 4.4 mmol/L (ref 3.5–5.2)
SODIUM: 138 mmol/L (ref 134–144)

## 2018-04-04 LAB — FSH/LH
FSH: 3.7 m[IU]/mL
LH: 2.6 m[IU]/mL

## 2018-04-04 LAB — TSH: TSH: 1.83 u[IU]/mL (ref 0.450–4.500)

## 2018-04-06 NOTE — Telephone Encounter (Signed)
Done.  Faxed referral to [facility].

## 2018-04-26 ENCOUNTER — Other Ambulatory Visit: Payer: Self-pay | Admitting: Medical

## 2018-05-06 ENCOUNTER — Ambulatory Visit: Payer: BLUE CROSS/BLUE SHIELD | Admitting: Internal Medicine

## 2018-05-06 ENCOUNTER — Telehealth: Payer: Self-pay | Admitting: Medical

## 2018-05-06 ENCOUNTER — Other Ambulatory Visit: Payer: Self-pay | Admitting: Medical

## 2018-05-06 MED ORDER — FLUCONAZOLE 100 MG PO TABS: 100.0000 mg | ORAL_TABLET | Freq: Every day | ORAL | 0 refills | Status: AC

## 2018-05-06 NOTE — Telephone Encounter (Signed)
Called and informed pt that rx was sent to the pharmacy

## 2018-05-06 NOTE — Telephone Encounter (Signed)
Med sent.

## 2018-05-06 NOTE — Telephone Encounter (Signed)
Pt called and states that she is has a yeast infection from taking the amitotics from when she was sick, she is having burning, itching, and thick white discharge, she is wanting to know if you will send her something in for that, pt uses [id number] - [place], [place] [address]. And pt can be reached at [phone]

## 2018-05-14 DIAGNOSIS — R351 Nocturia: Secondary | ICD-10-CM | POA: Diagnosis not present

## 2018-05-14 DIAGNOSIS — R3121 Asymptomatic microscopic hematuria: Secondary | ICD-10-CM | POA: Diagnosis not present

## 2018-05-18 ENCOUNTER — Ambulatory Visit: Payer: BLUE CROSS/BLUE SHIELD | Admitting: Internal Medicine

## 2018-05-19 ENCOUNTER — Encounter: Payer: Self-pay | Admitting: *Deleted

## 2018-05-27 DIAGNOSIS — R3129 Other microscopic hematuria: Secondary | ICD-10-CM | POA: Diagnosis not present

## 2018-05-27 DIAGNOSIS — R3121 Asymptomatic microscopic hematuria: Secondary | ICD-10-CM | POA: Diagnosis not present

## 2018-06-01 DIAGNOSIS — R3121 Asymptomatic microscopic hematuria: Secondary | ICD-10-CM | POA: Diagnosis not present

## 2018-06-01 DIAGNOSIS — R351 Nocturia: Secondary | ICD-10-CM | POA: Diagnosis not present

## 2018-07-29 ENCOUNTER — Encounter: Payer: Self-pay | Admitting: Medical

## 2018-07-29 ENCOUNTER — Ambulatory Visit: Payer: BLUE CROSS/BLUE SHIELD | Admitting: Medical

## 2018-07-29 VITALS — BP 120/74 | HR 76 | Temp 98.3°F | Resp 16 | Ht 65.0 in | Wt 234.0 lb

## 2018-07-29 DIAGNOSIS — R0602 Shortness of breath: Secondary | ICD-10-CM

## 2018-07-29 DIAGNOSIS — R0600 Dyspnea, unspecified: Secondary | ICD-10-CM | POA: Diagnosis not present

## 2018-07-29 DIAGNOSIS — D649 Anemia, unspecified: Secondary | ICD-10-CM | POA: Diagnosis not present

## 2018-07-29 LAB — POCT HEMOGLOBIN: HEMOGLOBIN: 10.8 g/dL — AB (ref 11–14.6)

## 2018-07-29 NOTE — Patient Instructions (Signed)
Encounter Diagnoses  Name Primary?  Marland Kitchen Dyspnea, unspecified type Yes  . SOB (shortness of breath)    Recommendations:  Begin Robitussin DM twice daily for 5 days  Increase water intake  Avoid acid reflux triggers  Consider taking Tums or OTC reflux medication for a 1-2 weeks  If improving, then work on using a guide to increase exercise tolerance   For example, if trying to increase aerobic exercise tolerance, consider the following regimen for running:  Week 1: Run/jog 30 minutes 3 days per week  Week 2: run/jog 2 miles on Monday, 2.5 miles Wednesday, 2 miles Friday  Week 3: run/jog 2.5 miles on Monday, 2.75 miles Wednesday, 2.5 miles Friday  And similarly increase exercise gradually   Watch to see if you feel easier to breath and better stamina with this gradual increase in exercise

## 2018-07-29 NOTE — Progress Notes (Signed)
Subjective: Chief Complaint  Patient presents with  . cough    chest congestion, cough, sob, back pain X 2 week   Here for chest congestion, cough, SOB x 2 weeks.  She notes some congestion for several weeks.  Feels like mucous just sitting in chest.  If laughing hard, this triggers a cough, feels some SOB.   This morning felt kind of tight in chest, pain in back.   Doesn't feel sick.   She is more worried about heart, lungs, as she feels winded going up steps or trying to run.  She notes she on average exercises 2 days per week for 30-60 minutes with walking, speed walking or sometimes running. Stamina is decreased, gets winded easily.  No leg edema, no calve pain.  Denies fever, sore throat, body aches, chills, head pressure.  No other aggravating or relieving factors. No other complaint.   Past Medical History:  Diagnosis Date  . BRCA negative 11/2012, 4/19   BRCA neg 2014; MyRisk update neg except POLE VUS 4/19  . Dysrhythmia    a. 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 Family history of breast cancer   . H. pylori infection 10/2010  . Increased risk of breast cancer 11/2012; 4/19   5/14 IBIS=25.6%; 4/19 IBIS=25.8%  . Iron deficiency anemia, unspecified   . Obesity   . Seasonal allergies    ragweed, tree pollen, mild and dust per testing 08/2012  . Unspecified vitamin D deficiency   . Vitamin D deficiency 2017   Current Outpatient Medications on File Prior to Visit  Medication Sig Dispense Refill  . cetirizine (ZYRTEC) 10 MG tablet Take 1 tablet (10 mg total) by mouth daily. (Patient not taking: Reported on 07/29/2018) 30 tablet 2  . omeprazole (PRILOSEC) 40 MG capsule TAKE 1 CAPSULE BY MOUTH EVERY DAY (Patient not taking: Reported on 07/29/2018) 30 capsule 1   No current facility-administered medications on file prior to visit.    Family History  Problem Relation Age of Onset  . Heart disease Mother 60       stent, CAD  . Breast cancer Mother 43       with recurrence  about 53  . Cancer Father        esophageal cancer  . Heart disease Father 11       died of MI  . Alcohol abuse Father   . Hypertension Sister   . Hypertension Brother   . CAD Brother        MI at age 49  . Hypertension Brother   . Breast cancer Maternal Grandmother 34  . Diabetes Neg Hx   . Stroke Neg Hx      ROS as in subjective   Objective: BP 120/74   Pulse 76   Temp 98.3 F (36.8 C) (Oral)   Resp 16   Ht 5' 5"  (1.651 m)   Wt 234 lb (106.1 kg)   SpO2 98%   BMI 38.94 kg/m    Wt Readings from Last 3 Encounters:  07/29/18 234 lb (106.1 kg)  04/03/18 229 lb 6.4 oz (104.1 kg)  01/28/18 226 lb 3.2 oz (102.6 kg)   General appearence: alert, no distress, WD/WN,  HEENT: normocephalic, sclerae anicteric, TMs pearly, nares patent, no discharge or erythema, pharynx normal Oral cavity: MMM, no lesions Neck: supple, no lymphadenopathy, no thyromegaly, no masses Heart: RRR, normal S1, S2, no murmurs Lungs: CTA bilaterally, no wheezes, rhonchi, or rales Abdomen: +bs, soft, non tender, non distended, no  masses, no hepatomegaly, no splenomegaly Pulses: 2+ symmetric, upper and lower extremities, normal cap refill  EKG Indication-dyspnea Rate 72 bpm, PR interval 176 ms, QRS duration 92 ms, QTC 427 ms, axis 57 degrees, nonspecific T wave abnormality, no change in 2018 EKG.   Assessment Encounter Diagnoses  Name Primary?  Marland Kitchen Dyspnea, unspecified type Yes  . SOB (shortness of breath)   . Anemia, unspecified type      Plan  We discussed her concerns and symptoms.  EKG and PFTs were both normal today.  She was anemic on hemoglobin screen so we will check iron and stool cards.  I suspect she could have a little bit of chest congestion so she will use Robitussin DM and increase hydration for the next week  I also suspect her obesity and need for exercise tolerance can be improved upon, which could help her symptoms.  We discussed gradual increase of exercise that she can  measure and determine if she is seeing improvements in her breathing over the next month and a half.  Shilah was seen today for cough.  Diagnoses and all orders for this visit:  Dyspnea, unspecified type -     EKG 12-Lead -     Spirometry with graph -     Iron  SOB (shortness of breath) -     EKG 12-Lead -     Spirometry with graph -     Iron  Anemia, unspecified type -     Iron -     Hemoglobin

## 2018-07-30 LAB — IRON: Iron: 61 ug/dL (ref 27–159)

## 2018-08-24 ENCOUNTER — Ambulatory Visit: Payer: BLUE CROSS/BLUE SHIELD | Admitting: Medical

## 2018-08-24 ENCOUNTER — Encounter: Payer: Self-pay | Admitting: Medical

## 2018-08-24 VITALS — BP 110/70 | HR 64 | Temp 98.0°F | Resp 16 | Ht 65.0 in | Wt 236.2 lb

## 2018-08-24 DIAGNOSIS — D649 Anemia, unspecified: Secondary | ICD-10-CM | POA: Diagnosis not present

## 2018-08-24 DIAGNOSIS — R059 Cough, unspecified: Secondary | ICD-10-CM | POA: Insufficient documentation

## 2018-08-24 DIAGNOSIS — R05 Cough: Secondary | ICD-10-CM

## 2018-08-24 DIAGNOSIS — J40 Bronchitis, not specified as acute or chronic: Secondary | ICD-10-CM | POA: Diagnosis not present

## 2018-08-24 DIAGNOSIS — R062 Wheezing: Secondary | ICD-10-CM | POA: Diagnosis not present

## 2018-08-24 DIAGNOSIS — Z1211 Encounter for screening for malignant neoplasm of colon: Secondary | ICD-10-CM

## 2018-08-24 DIAGNOSIS — R0600 Dyspnea, unspecified: Secondary | ICD-10-CM

## 2018-08-24 LAB — HEMOCCULT GUIAC POC 1CARD (OFFICE)
Card #2 Fecal Occult Blod, POC: NEGATIVE
FECAL OCCULT BLD: NEGATIVE
Fecal Occult Blood, POC: NEGATIVE

## 2018-08-24 MED ORDER — ALBUTEROL SULFATE HFA 108 (90 BASE) MCG/ACT IN AERS: 2.0000 | INHALATION_SPRAY | Freq: Four times a day (QID) | RESPIRATORY_TRACT | 0 refills | Status: DC | PRN

## 2018-08-24 MED ORDER — AMOXICILLIN 875 MG PO TABS
875.0000 mg | ORAL_TABLET | Freq: Two times a day (BID) | ORAL | 0 refills | Status: DC
Start: 2018-08-24 — End: 2018-11-05

## 2018-08-24 NOTE — Progress Notes (Signed)
Subjective:  Kristy [name] is a 48 y.o. female who presents for cough.    Here for cough and congestion.  I saw her 2 weeks ago for similar.  At that time it seemed to be more of a respiratory tract infection but she has continued to have congestion.  She took some leftover amoxicillin and already feels better.  Would like an extension of amoxicillin.  She felt quite wheezy yesterday and short of breath.  She is out of her inhaler would like a refill on this.  Otherwise has not been having to use inhalers regularly, has not felt dyspneic up until the last 2 weeks.  She presented for fatigue back in September 2019.  From that visit till now she has not had a lot of complaints.  She does have a history of seeing pulmonology 2018 as well as cardiology for stress test when she was having chronic cough and shortness of breath symptoms as well as palpitations.  She denies any recent palpitations.  She is not a smoker.  No other aggravating or relieving factors.  No other c/o.  The following portions of the patient's history were reviewed and updated as appropriate: allergies, current medications, past family history, past medical history, past social history, past surgical history and problem list.  ROS as in subjective  Past Medical History:  Diagnosis Date  . BRCA negative 11/2012, 4/19   BRCA neg 2014; MyRisk update neg except POLE VUS 4/19  . Dysrhythmia    a. 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 Family history of breast cancer   . H. pylori infection 10/2010  . Increased risk of breast cancer 11/2012; 4/19   5/14 IBIS=25.6%; 4/19 IBIS=25.8%  . Iron deficiency anemia, unspecified   . Obesity   . Seasonal allergies    ragweed, tree pollen, mild and dust per testing 08/2012  . Unspecified vitamin D deficiency   . Vitamin D deficiency 2017   Current Outpatient Medications on File Prior to Visit  Medication Sig Dispense Refill  . cetirizine (ZYRTEC) 10 MG tablet Take 1 tablet  (10 mg total) by mouth daily. (Patient not taking: Reported on 07/29/2018) 30 tablet 2  . omeprazole (PRILOSEC) 40 MG capsule TAKE 1 CAPSULE BY MOUTH EVERY DAY (Patient not taking: Reported on 07/29/2018) 30 capsule 1   No current facility-administered medications on file prior to visit.    ROS as in subjecitve  Objective: BP 110/70   Pulse 64   Temp 98 F (36.7 C) (Oral)   Resp 16   Ht _0  (1.651 m)   Wt 236 lb 3.2 oz (107.1 kg)   SpO2 98%   BMI 39.31 kg/m   General appearance: Alert, WD/WN, no distress                             Skin: warm, no rash, no diaphoresis                           Head: no sinus tenderness                            Eyes: conjunctiva normal, corneas clear, PERRLA                            Ears: pearly TMs, external ear canals normal  Nose: septum midline, turbinates swollen, with no erythema or discharge             Mouth/throat: MMM, tongue normal, mild pharyngeal erythema                           Neck: supple, no adenopathy, no thyromegaly, nontender                          Heart: RRR, normal S1, S2, no murmurs                         Lungs:clear, no wheezes, no rales                Extremities: no edema, nontender        Assessment: Encounter Diagnoses  Name Primary?  . Cough Yes  . Bronchitis   . Wheezing   . Anemia, unspecified type   . Screening for colon cancer   . Dyspnea, unspecified type      Plan:  Begin medications below for current symptoms.  Hydrate well, rest.  Call or return if not improved by the end of this week  Anemia noticed on recent labs-she will bring in stool cards that I gave her last visit.  Wheezing, dyspnea-medications as below.  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  We reviewed these and discussed this today.  Continue regular exercise once she  is back to her baseline in a week or so  [name] was seen today for chest congestion.  Diagnoses and all orders for this visit:  Cough  Bronchitis  Wheezing  Anemia, unspecified type  Screening for colon cancer -     Hemoccult - 1 Card (office)  Dyspnea, unspecified type  Other orders -     amoxicillin (AMOXIL) 875 MG tablet; Take 1 tablet (875 mg total) by mouth 2 (two) times daily. -     albuterol (PROVENTIL HFA;VENTOLIN HFA) 108 (90 Base) MCG/ACT inhaler; Inhale 2 puffs into the lungs every 6 (six) hours as needed for wheezing or shortness of breath.

## 2018-09-03 ENCOUNTER — Emergency Department (HOSPITAL_COMMUNITY)
Admission: EM | Admit: 2018-09-03 | Discharge: 2018-09-03 | Disposition: A | Discharge status: Home / Self Care | Payer: BLUE CROSS/BLUE SHIELD | Attending: Emergency Medicine | Admitting: Emergency Medicine

## 2018-09-03 ENCOUNTER — Emergency Department (HOSPITAL_COMMUNITY): Payer: BLUE CROSS/BLUE SHIELD

## 2018-09-03 ENCOUNTER — Other Ambulatory Visit: Payer: Self-pay

## 2018-09-03 ENCOUNTER — Encounter (HOSPITAL_COMMUNITY): Payer: Self-pay | Admitting: Emergency Medicine

## 2018-09-03 DIAGNOSIS — R05 Cough: Secondary | ICD-10-CM | POA: Insufficient documentation

## 2018-09-03 DIAGNOSIS — Z79899 Other long term (current) drug therapy: Secondary | ICD-10-CM | POA: Insufficient documentation

## 2018-09-03 DIAGNOSIS — R0602 Shortness of breath: Secondary | ICD-10-CM | POA: Insufficient documentation

## 2018-09-03 DIAGNOSIS — R059 Cough, unspecified: Secondary | ICD-10-CM

## 2018-09-03 DIAGNOSIS — R7989 Other specified abnormal findings of blood chemistry: Secondary | ICD-10-CM | POA: Diagnosis not present

## 2018-09-03 DIAGNOSIS — R0981 Nasal congestion: Secondary | ICD-10-CM | POA: Insufficient documentation

## 2018-09-03 DIAGNOSIS — R0789 Other chest pain: Secondary | ICD-10-CM

## 2018-09-03 LAB — CBC WITH DIFFERENTIAL/PLATELET
Abs Immature Granulocytes: 0.01 10*3/uL (ref 0.00–0.07)
Basophils Absolute: 0 10*3/uL (ref 0.0–0.1)
Basophils Relative: 1 %
Eosinophils Absolute: 0.4 10*3/uL (ref 0.0–0.5)
Eosinophils Relative: 9 %
HCT: 38.7 % (ref 36.0–46.0)
Hemoglobin: 12.6 g/dL (ref 12.0–15.0)
Immature Granulocytes: 0 %
Lymphocytes Relative: 34 %
Lymphs Abs: 1.5 10*3/uL (ref 0.7–4.0)
MCH: 28.8 pg (ref 26.0–34.0)
MCHC: 32.6 g/dL (ref 30.0–36.0)
MCV: 88.4 fL (ref 80.0–100.0)
Monocytes Absolute: 0.3 10*3/uL (ref 0.1–1.0)
Monocytes Relative: 6 %
Neutro Abs: 2.2 10*3/uL (ref 1.7–7.7)
Neutrophils Relative %: 50 %
Platelets: 258 10*3/uL (ref 150–400)
RBC: 4.38 MIL/uL (ref 3.87–5.11)
RDW: 13.2 % (ref 11.5–15.5)
WBC: 4.4 10*3/uL (ref 4.0–10.5)
nRBC: 0 % (ref 0.0–0.2)

## 2018-09-03 LAB — BASIC METABOLIC PANEL WITH GFR
Anion gap: 8 (ref 5–15)
BUN: 7 mg/dL (ref 6–20)
CO2: 25 mmol/L (ref 22–32)
Calcium: 8.7 mg/dL — ABNORMAL LOW (ref 8.9–10.3)
Chloride: 104 mmol/L (ref 98–111)
Creatinine, Ser: 0.85 mg/dL (ref 0.44–1.00)
GFR calc Af Amer: >60 mL/min
GFR calc non Af Amer: >60 mL/min
Glucose, Bld: 100 mg/dL — ABNORMAL HIGH (ref 70–99)
Potassium: 4.1 mmol/L (ref 3.5–5.1)
Sodium: 137 mmol/L (ref 135–145)

## 2018-09-03 LAB — I-STAT BETA HCG BLOOD, ED (MC, WL, AP ONLY): I-stat hCG, quantitative: <5 m[IU]/mL (ref <5)

## 2018-09-03 LAB — D-DIMER, QUANTITATIVE: D-Dimer, Quant: 0.52 ug/mL-FEU — ABNORMAL HIGH (ref 0.00–0.50)

## 2018-09-03 MED ORDER — IPRATROPIUM-ALBUTEROL 0.5-2.5 (3) MG/3ML IN SOLN
3.0000 mL | Freq: Once | RESPIRATORY_TRACT | Status: AC
  Administered 2018-09-03: 3 mL via RESPIRATORY_TRACT
  Filled 2018-09-03: qty 3

## 2018-09-03 MED ORDER — IOPAMIDOL (ISOVUE-370) INJECTION 76%
100.0000 mL | Freq: Once | INTRAVENOUS | Status: AC | PRN
  Administered 2018-09-03: 63 mL via INTRAVENOUS

## 2018-09-03 MED ORDER — PREDNISONE 20 MG PO TABS
60.0000 mg | ORAL_TABLET | Freq: Once | ORAL | Status: AC
  Administered 2018-09-03: 60 mg via ORAL
  Filled 2018-09-03: qty 3

## 2018-09-03 MED ORDER — DIPHENHYDRAMINE HCL 50 MG/ML IJ SOLN
50.0000 mg | Freq: Once | INTRAMUSCULAR | Status: AC
  Administered 2018-09-03: 50 mg via INTRAVENOUS
  Filled 2018-09-03: qty 1

## 2018-09-03 MED ORDER — IPRATROPIUM-ALBUTEROL 0.5-2.5 (3) MG/3ML IN SOLN
3.0000 mL | Freq: Once | RESPIRATORY_TRACT | Status: AC
  Administered 2018-09-03: 3 mL via RESPIRATORY_TRACT
  Filled 2018-09-03: qty 6

## 2018-09-03 MED ORDER — PREDNISONE 20 MG PO TABS: 60.0000 mg | ORAL_TABLET | Freq: Every day | ORAL | 0 refills | Status: AC

## 2018-09-03 NOTE — ED Notes (Signed)
Walked patient to the bathroom  

## 2018-09-03 NOTE — ED Provider Notes (Signed)
[facility] EMERGENCY DEPARTMENT Provider Note   CSN: [id number] Arrival date & time: 09/03/18  7106    History   Chief Complaint Chief Complaint  Patient presents with  . Shortness of Breath  . Cough    HPI Kristy [name] is a 48 y.o. female.     Kristy [name] is a 48 y.o. female with a history of seasonal allergies, iron deficiency and PVCs, who presents to the emergency department for evaluation of shortness of breath and chest congestion.  Patient reports she initially started with a productive cough 3 weeks ago and tightness in her chest, saw her PCP was started on Robitussin, symptoms persisted for another week and she returned to her PCPs office and was started on amoxicillin and an albuterol inhaler, no chest x-ray completed.  Patient reports despite taking these medications regularly as directed her symptoms have worsened.  She reports occasional nonproductive cough, she feels like she has congestion in her chest but cannot get it up and a worsening sensation of chest tightness especially when she tries to take a deep breath.  She reports that shortness of breath and chest tightness also seems to be worse when she tries to lay down at night to sleep.  She denies any fevers or chills.  No persistent chest pain just the tight sensation.  She denies any abdominal pain, nausea or vomiting.  No diaphoresis.  No lightheadedness or syncope.  Patient had a mild pain in her left calf yesterday but has not had any persistent lower extremity pain and denies any lower extremity swelling, redness or warmth.  She denies any history of PE or DVT, is a non-smoker, is not on any OCPs, did have a recent 10-hour car trip last week, no recent hospitalizations or surgeries, no family history of blood clots or clotting disorder.  No other aggravating or alleviating factors.     Past Medical History:  Diagnosis Date  . BRCA negative 11/2012, 4/19   BRCA neg 2014; MyRisk update  neg except POLE VUS 4/19  . Dysrhythmia    a. 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 Family history of breast cancer   . H. pylori infection 10/2010  . Increased risk of breast cancer 11/2012; 4/19   5/14 IBIS=25.6%; 4/19 IBIS=25.8%  . Iron deficiency anemia, unspecified   . Obesity   . Seasonal allergies    ragweed, tree pollen, mild and dust per testing 08/2012  . Unspecified vitamin D deficiency   . Vitamin D deficiency 2017    Patient Active Problem List   Diagnosis Date Noted  . Anemia 08/24/2018  . Cough 08/24/2018  . Sinus pressure 04/03/2018  . Sleep disturbance 04/03/2018  . Acute right-sided back pain 04/03/2018  . Polyuria 04/03/2018  . Impaired fasting blood sugar 04/03/2018  . History of tubal ligation 04/03/2018  . Abdominal discomfort 01/28/2018  . Frequency of urination 01/28/2018  . Increased risk of breast cancer   . BRCA negative   . BV (bacterial vaginosis) 10/15/2017  . Hip pain, right 04/23/2017  . PVC (premature ventricular contraction) 03/05/2017  . Family history of coronary artery disease 02/06/2017  . Blurred vision 02/06/2017  . Elevated blood-pressure reading without diagnosis of hypertension 02/06/2017  . Heart palpitations 02/06/2017  . Headache syndrome 02/06/2017  . Other fatigue 02/06/2017  . Daytime somnolence 02/06/2017  . Family history of breast cancer 11/25/2016  . Upper airway cough syndrome 09/04/2016  . Dyspnea 09/04/2016  . Female sterility 09/14/2015  .  Menorrhagia with regular cycle 09/14/2015  . Fibroids, submucosal 09/14/2015  . Ovarian cyst, right 09/14/2015  . Vulvar lesion 09/14/2015  . Chronic vaginitis 09/14/2015  . Encounter for health maintenance examination in adult 06/28/2015  . Obesity with serious comorbidity 06/28/2015  . Vitamin D deficiency 06/28/2015  . Anemia, iron deficiency 06/28/2015  . Microscopic hematuria 06/28/2015  . Allergic rhinitis 03/31/2013    Past Surgical History:  Procedure  Laterality Date  . BREAST BIOPSY Right 1980s   neg  . CESAREAN SECTION    . [place] N/A 09/14/2015   Procedure: DILATATION & CURETTAGE/HYSTEROSCOPY WITH NOVASURE ABLATION;  Surgeon: Gae Dry, MD;  Location: [facility] OR[facility];  Service: Gynecology;  Laterality: N/A;  . IUD REMOVAL N/A 09/14/2015   Procedure: INTRAUTERINE DEVICE (IUD) REMOVAL;  Surgeon: Gae Dry, MD;  Location: [facility] OR[facility];  Service: Gynecology;  Laterality: N/A;  . LAPAROSCOPIC BILATERAL SALPINGECTOMY Bilateral 09/14/2015   Procedure: LAPAROSCOPIC BILATERAL SALPINGECTOMY;  Surgeon: Gae Dry, MD;  Location: [facility] OR[facility];  Service: Gynecology;  Laterality: Bilateral;  . TUBAL LIGATION Bilateral 09/14/2015   Procedure: BILATERAL TUBAL LIGATION;  Surgeon: Gae Dry, MD;  Location: [facility];  Service: Gynecology;  Laterality: Bilateral;  . VULVAR LESION REMOVAL N/A 09/14/2015   Procedure: VULVAR LESION;  Surgeon: Gae Dry, MD;  Location: [facility] OR[facility];  Service: Gynecology;  Laterality: N/A;  . WISDOM TOOTH EXTRACTION       OB History    Gravida  3   Para  1   Term  1   Preterm      AB  2   Living  1     SAB      TAB      Ectopic      Multiple      Live Births  1            Home Medications    Prior to Admission medications   Medication Sig Start Date End Date Taking? Authorizing Provider  albuterol (PROVENTIL HFA;VENTOLIN HFA) 108 (90 Base) MCG/ACT inhaler Inhale 2 puffs into the lungs every 6 (six) hours as needed for wheezing or shortness of breath. 08/24/18  Yes Tysinger, Camelia Eng, PA-C  amoxicillin (AMOXIL) 875 MG tablet Take 1 tablet (875 mg total) by mouth 2 (two) times daily. 08/24/18  Yes Tysinger, Camelia Eng, PA-C  Aspirin-Acetaminophen-Caffeine (GOODY HEADACHE PO) Take 1 Package by mouth as needed (headache).   Yes [provider]  cetirizine (ZYRTEC) 10 MG tablet Take 1 tablet (10 mg total) by mouth daily. Patient taking differently:  Take 10 mg by mouth daily as needed for allergies.  04/03/18  Yes Tysinger, Camelia Eng, PA-C  guaiFENesin-dextromethorphan (ROBITUSSIN DM) 100-10 MG/5ML syrup Take 10 mLs by mouth every 4 (four) hours as needed for cough.   Yes [provider]  omeprazole (PRILOSEC) 40 MG capsule TAKE 1 CAPSULE BY MOUTH EVERY DAY Patient taking differently: Take 40 mg by mouth as needed (acid reflux).  04/27/18  Yes Tysinger, Camelia Eng, PA-C    Family History Family History  Problem Relation Age of Onset  . Heart disease Mother 58       stent, CAD  . Breast cancer Mother 25       with recurrence about 10  . Cancer Father        esophageal cancer  . Heart disease Father 64       died of MI  . Alcohol abuse Father   .  Hypertension Sister   . Hypertension Brother   . CAD Brother        MI at age 61  . Hypertension Brother   . Breast cancer Maternal Grandmother 67  . Diabetes Neg Hx   . Stroke Neg Hx     Social History Social History   Tobacco Use  . Smoking status: Never Smoker  . Smokeless tobacco: Never Used  Substance Use Topics  . Alcohol use: No    Comment: maybe one drink a year  . Drug use: No     Allergies   Iodinated diagnostic agents   Review of Systems Review of Systems  Constitutional: Negative for chills and fever.  HENT: Positive for congestion. Negative for ear pain, rhinorrhea, sinus pain and sore throat.   Eyes: Negative for visual disturbance.  Respiratory: Positive for cough, chest tightness and shortness of breath. Negative for wheezing.   Cardiovascular: Negative for chest pain, palpitations and leg swelling.  Gastrointestinal: Negative for abdominal pain, nausea and vomiting.  Genitourinary: Negative for dysuria and frequency.  Musculoskeletal: Negative for arthralgias and myalgias.  Skin: Negative for color change and rash.  Neurological: Negative for dizziness, syncope and light-headedness.     Physical Exam Updated Vital Signs BP 130/81 (BP  Location: Right Arm)   Pulse 78   Temp 98.2 F (36.8 C) (Oral)   Resp 15   Ht 5' 6"  (1.676 m)   Wt 107 kg   LMP  (Exact Date)   SpO2 99%   BMI 38.09 kg/m   Physical Exam Vitals signs and nursing note reviewed.  Constitutional:      General: She is not in acute distress.    Appearance: She is well-developed. She is not diaphoretic.  HENT:     Head: Normocephalic and atraumatic.  Eyes:     General:        Right eye: No discharge.        Left eye: No discharge.     Pupils: Pupils are equal, round, and reactive to light.  Neck:     Musculoskeletal: Neck supple.  Cardiovascular:     Rate and Rhythm: Normal rate and regular rhythm.     Heart sounds: Normal heart sounds. No murmur. No friction rub. No gallop.   Pulmonary:     Effort: Pulmonary effort is normal. No respiratory distress.     Breath sounds: Normal breath sounds. No wheezing or rales.     Comments: Respirations equal and unlabored, patient able to speak in full sentences, lungs clear to auscultation bilaterally, occasional dry cough during exam Chest:     Chest wall: No tenderness.  Abdominal:     General: Bowel sounds are normal. There is no distension.     Palpations: Abdomen is soft. There is no mass.     Tenderness: There is no abdominal tenderness. There is no guarding.     Comments: Respirations equal and unlabored, patient able to speak in full sentences, lungs clear to auscultation bilaterally  Musculoskeletal:        General: No deformity.     Right lower leg: She exhibits no tenderness. No edema.     Left lower leg: She exhibits no tenderness. No edema.     Comments: Bilateral lower extremities without edema or tenderness.  Skin:    General: Skin is warm and dry.     Capillary Refill: Capillary refill takes less than 2 seconds.  Neurological:     Mental Status: She is alert.  Coordination: Coordination normal.     Comments: Speech is clear, able to follow commands Moves extremities without  ataxia, coordination intact  Psychiatric:        Mood and Affect: Mood normal.        Behavior: Behavior normal.      ED Treatments / Results  Labs (all labs ordered are listed, but only abnormal results are displayed) Labs Reviewed  BASIC METABOLIC PANEL - Abnormal; Notable for the following components:      Result Value   Glucose, Bld 100 (*)    Calcium 8.7 (*)    All other components within normal limits  D-DIMER, QUANTITATIVE (NOT AT [facility]) - Abnormal; Notable for the following components:   D-Dimer, Quant 0.52 (*)    All other components within normal limits  CBC WITH DIFFERENTIAL/PLATELET  I-STAT BETA HCG BLOOD, ED ([facility], [facility], [facility] ONLY)    EKG EKG Interpretation  Date/Time:  Thursday September 03 2018 07:24:36 EST Ventricular Rate:  77 PR Interval:    QRS Duration: 101 QT Interval:  363 QTC Calculation: 411 R Axis:   75 Text Interpretation:  Sinus rhythm Borderline T wave abnormalities no change Confirmed by Charlesetta Shanks [phone]) on 09/03/2018 7:30:24 AM   Radiology Dg Chest 2 View  Result Date: 09/03/2018 CLINICAL DATA:  Short of breath and chest tightness EXAM: CHEST - 2 VIEW COMPARISON:  Chest radiograph August 27, 2016 and chest CT August 29, 2016 FINDINGS: There is no edema or consolidation. The heart size and pulmonary vascularity are normal. No adenopathy. No pneumothorax. No focal bone lesions evident. IMPRESSION: No edema or consolidation. Electronically Signed   By: Lowella Grip III M.D.   On: 09/03/2018 08:12   Ct Angio Chest Pe W And/or Wo Contrast  Result Date: 09/03/2018 CLINICAL DATA:  Short of breath and cough positive D-dimer. Rule out pulmonary embolism. EXAM: CT ANGIOGRAPHY CHEST WITH CONTRAST TECHNIQUE: Multidetector CT imaging of the chest was performed using the standard protocol during bolus administration of intravenous contrast. Multiplanar CT image reconstructions and MIPs were obtained to evaluate the vascular anatomy. CONTRAST:  32m  ISOVUE-370 IOPAMIDOL (ISOVUE-370) INJECTION 76% COMPARISON:  Chest two-view 09/03/2018.  CT a chest 08/29/2016 FINDINGS: Cardiovascular: Negative for pulmonary embolism. Pulmonary arteries normal in caliber. Heart size normal. Normal thoracic aorta. Mediastinum/Nodes: Small hiatal hernia. Negative for mass or adenopathy Lungs/Pleura: Lungs are well aerated and clear. No infiltrate effusion or mass. Upper Abdomen: Negative Musculoskeletal: Negative Review of the MIP images confirms the above findings. IMPRESSION: Negative for pulmonary embolism. Lungs are clear. No acute abnormality in the chest. Electronically Signed   By: CFranchot GalloM.D.   On: 09/03/2018 10:28    Procedures Procedures (including critical care time)  Medications Ordered in ED Medications  predniSONE (DELTASONE) tablet 60 mg (has no administration in time range)  ipratropium-albuterol (DUONEB) 0.5-2.5 (3) MG/3ML nebulizer solution 3 mL (has no administration in time range)  ipratropium-albuterol (DUONEB) 0.5-2.5 (3) MG/3ML nebulizer solution 3 mL (3 mLs Nebulization Given 09/03/18 0828)  diphenhydrAMINE (BENADRYL) injection 50 mg (50 mg Intravenous Given 09/03/18 0932)  iopamidol (ISOVUE-370) 76 % injection 100 mL (63 mLs Intravenous Contrast Given 09/03/18 1014)     Initial Impression / Assessment and Plan / ED Course  I have reviewed the triage vital signs and the nursing notes.  Pertinent labs & imaging results that were available during my care of the patient were reviewed by me and considered in my medical decision making (see chart for details).  Patient  presents to the emergency department for evaluation of shortness of breath and 3 weeks of persistent cough and chest congestion.  She seen her PCP twice and been started on amoxicillin and an inhaler but her symptoms have continued to worsen.  She reports that her chest feels tight when she tries to take a deep breath, but she denies associated chest pain, no  lightheadedness or syncope.  Denies lower extremity swelling, did have 10-hour car trip last week, no history of PE or DVT and no other risk factors.  On arrival patient with normal vitals and appears to be in no acute distress.  Will check chest x-ray, basic labs, EKG and d-dimer given patient's persistent cough and chest tightness that has not improved despite multiple treatments, patient did have episode of tachycardia to 105 while I was in the room examining her but vitals are otherwise stable, no hypoxia or tachypnea.  Aside from recent 10-hour car trip no other risk factors and no clinical signs of DVT.  EKG shows normal sinus rhythm without acute ischemic changes.  Patient symptoms do not seem consistent with ACS and she denies any chest pain, troponin not ordered.  Patient with no leukocytosis and normal hemoglobin, no acute electrolyte derangements requiring intervention and normal renal function.  Negative hCG.  D-dimer is slightly elevated at 0.52, per chart review patient has history of mild allergic reaction with IV contrast, but a few months ago had a contrasted scan of the abdomen and was given 50 mg of Benadryl 1 hour prior to study with no reaction or symptoms.  Discussed with Dr. Vernard Gambles with radiology and he thinks this is reasonable plan given patient has tolerated well in the past, no need to give 3-hour dose of Solu-Cortef prior to scan.  CTA of the chest shows no evidence of pulmonary embolus and no other cardiopulmonary disease noted.  I suspect patient's chest tightness and cough is due to bronchial inflammation, she did have significant improvement in her chest tightness with DuoNeb today, although reports that starting to come back, will start patient on course of prednisone and have her continue with albuterol inhaler at home, encouraged to complete the course of antibiotics her PCP prescribed her as she has a few days left.  I have discussed appropriate return precautions.  Vitals  have remained stable throughout ED stay with no tachypnea or hypoxia.  Discharged home in good condition.  Final Clinical Impressions(s) / ED Diagnoses   Final diagnoses:  Shortness of breath  Chest tightness  Cough    ED Discharge Orders         Ordered    predniSONE (DELTASONE) 20 MG tablet  Daily     09/03/18 1139           Benedetto Goad [place], [place] 09/03/18 1253    Charlesetta Shanks, MD 09/03/18 1308

## 2018-09-03 NOTE — ED Triage Notes (Signed)
Pt reports SOB and cough for the last 3 weeks. Pt reports going to her PCP and being started on Amoxicillin and an inhaler. Pt reports non-productive cough.

## 2018-09-03 NOTE — Discharge Instructions (Addendum)
Your work-up today was overall reassuring, CT scan shows no evidence of blood clot in the lung and no evidence of pneumonia.  Your chest tightness and cough may be due to bronchial inflammation in your lungs, complete your antibiotic course but take steroids as directed for the next 5 days and continue using your inhaler every 4 hours as needed for chest tightness and shortness of breath.  Can try Mucinex DM for cough since Robitussin does not seem to be helping with the chest congestion.  Follow-up with your primary care doctor next week for reevaluation.  Return to the emergency department if you develop worsening shortness of breath or difficulty breathing, persistent cough, fevers, chest pain, you feel lightheaded or like you may pass out or any other new or concerning symptoms occur.

## 2018-09-15 ENCOUNTER — Other Ambulatory Visit: Payer: Self-pay | Admitting: Medical

## 2018-09-15 NOTE — Telephone Encounter (Signed)
Is this ok to refill?  

## 2018-11-05 ENCOUNTER — Ambulatory Visit: Payer: BLUE CROSS/BLUE SHIELD | Admitting: Family Medicine

## 2018-11-05 ENCOUNTER — Other Ambulatory Visit: Payer: Self-pay

## 2018-11-05 ENCOUNTER — Encounter: Payer: Self-pay | Admitting: Family Medicine

## 2018-11-05 VITALS — Temp 97.8°F | Ht 65.0 in | Wt 216.0 lb

## 2018-11-05 DIAGNOSIS — R1030 Lower abdominal pain, unspecified: Secondary | ICD-10-CM | POA: Diagnosis not present

## 2018-11-05 NOTE — Patient Instructions (Addendum)
We discussed avoiding dairy (lactose-free diet) for the next week.  Try and cut back on gas-producing foods (such as beans, spinach, and other raw vegetables, like broccoli).  Use Gas-X (simethicone) as needed for the pain.  We discussed the various potential causes for your pain, including gas, bladder infection, ovarian cyst, or other bowel-related causes (constipation, diverticulosis/itis, etc).   You may want to follow up with your gynecologist if your pain persists, for a pelvic exam (with it having started on the left side, ovarian cyst is a possibility). If you develop fever, chills, vomiting, worsening pain, blood in your stool, worsening urinary complaints (pain with urination, visible blood in the urine, flank pain), you will need to be evaluated in person (ER if over the weekend).   Abdominal Bloating When you have abdominal bloating, your abdomen may feel full, tight, or painful. It may also look bigger than normal or swollen (distended). Common causes of abdominal bloating include:  Swallowing air.  Constipation.  Problems digesting food.  Eating too much.  Irritable bowel syndrome. This is a condition that affects the large intestine.  Lactose intolerance. This is an inability to digest lactose, a natural sugar in dairy products.  Celiac disease. This is a condition that affects the ability to digest gluten, a protein found in some grains.  Gastroparesis. This is a condition that slows down the movement of food in the stomach and small intestine. It is more common in people with diabetes mellitus.  Gastroesophageal reflux disease (GERD). This is a digestive condition that makes stomach acid flow back into the esophagus.  Urinary retention. This means that the body is holding onto urine, and the bladder cannot be emptied all the way. Follow these instructions at home: Eating and drinking  Avoid eating too much.  Try not to swallow air while talking or eating.  Avoid  eating while lying down.  Avoid these foods and drinks: ? Foods that cause gas, such as broccoli, cabbage, cauliflower, and baked beans. ? Carbonated drinks. ? Hard candy. ? Chewing gum. Medicines  Take over-the-counter and prescription medicines only as told by your health care provider.  Take probiotic medicines. These medicines contain live bacteria or yeasts that can help digestion.  Take coated peppermint oil capsules. Activity  Try to exercise regularly. Exercise may help to relieve bloating that is caused by gas and relieve constipation. General instructions  Keep all follow-up visits as told by your health care provider. This is important. Contact a health care provider if:  You have nausea and vomiting.  You have diarrhea.  You have abdominal pain.  You have unusual weight loss or weight gain.  You have severe pain, and medicines do not help. Get help right away if:  You have severe chest pain.  You have trouble breathing.  You have shortness of breath.  You have trouble urinating.  You have darker urine than normal.  You have blood in your stools or have dark, tarry stools. Summary  Abdominal bloating means that the abdomen is swollen.  Common causes of abdominal bloating are swallowing air, constipation, and problems digesting food.  Avoid eating too much and avoid swallowing air.  Avoid foods that cause gas, carbonated drinks, hard candy, and chewing gum. This information is not intended to replace advice given to you by your health care provider. Make sure you discuss any questions you have with your health care provider. Document Released: 08/02/2016 Document Revised: 08/02/2016 Document Reviewed: 08/02/2016 Elsevier Interactive Patient Education  2019  [place], Adult Lactose intolerance is when the body is not able to digest lactose, a natural sugar that is found in milk and milk products. If you are  lactose intolerant, avoiding foods and drinks that contain lactose may help ease digestive problems such as diarrhea or stomach pain. What are tips for following this plan? Reading food labels  Check food ingredient lists carefully. Avoid foods made with butter, cream, milk, milk solids, milk powder, curd, caseinate, or whey.  Avoid products with the note "may contain milk."  Look for the words "lactose-free" or "lactose-reduced" on food labels. You can eat lactose-free foods, and you may be able to eat small amounts of lactose-reduced foods. Shopping  Look for nondairy substitutes, such as: ? Nondairy creamer. ? Almond or soy milk. ? Soy or coconut yogurt. ? Dairy-free cheese.  Buy lactose-free cow milk. Cooking  Avoid cooking with butter. Use vegetable, nut, and seed oils instead.  Prepare soups without cream. Use other products to thicken soups, such as corn starch or tomato paste. Meal planning  Avoid eating foods that contain lactose.  Some people with lactose intolerance can eat foods that contain small amounts of lactose. Foods that contain less than 1 gram of lactose per serving include: ? 1-2 oz of aged cheese, such as Parmesan, Swiss, or cheddar. ? 2 Tbsp of cream cheese. ? ? cup of cottage cheese. ?  cup of ricotta cheese.  Some people are able to tolerate cultured dairy products, such as yogurt, buttermilk, and kefir. The healthy bacteria in these products helps you digest lactose.  If you decide to try a food that contains lactose: ? Eat only one food with lactose in it at a time. ? Eat only a small amount of the food. ? Stop eating the food if your symptoms return. Getting enough calcium  Milk and milk products contain a lot of calcium, which is an important nutrient for your health. When you avoid milk and milk products, make sure to get calcium from other foods.  Talk with your health care provider about how much calcium you need each day. The amount of  calcium you need each day depends on your age and overall health.  Nondairy foods that are high in calcium include: ? Sardines and canned salmon. ? Dried beans. ? Almonds. ? Turnip greens, collards, kale, and broccoli. ? Calcium-fortified soy milk and tofu. ? Calcium-fortified orange juice.  Talk with your health care provider about vitamin and mineral supplements. Take supplements only as directed. Summary  Avoiding foods and drinks that contain lactose may help ease digestive problems such as diarrhea or stomach pain.  When you avoid milk and milk products, make sure to get calcium from other foods.  Take vitamin and mineral supplements only as directed by your health care provider. This information is not intended to replace advice given to you by your health care provider. Make sure you discuss any questions you have with your health care provider. Document Released: 01/26/2014 Document Revised: 10/11/2016 Document Reviewed: 10/11/2016 Elsevier Interactive Patient Education  2019 [employer].

## 2018-11-05 NOTE — Progress Notes (Signed)
Start time: 3:10 End time: 3:45  Virtual Visit via Video Note  I connected with Kristy [name] on 11/05/18 at  2:45 PM EDT by a video enabled telemedicine application and verified that I am speaking with the correct person using two identifiers. She is at her workplace, with her sister. MD is at work.   I discussed the limitations of evaluation and management by telemedicine and the availability of in person appointments. The patient expressed understanding and agreed to proceed. She understands that her insurance will be billed for this visit.  History of Present Illness:  Chief Complaint  Patient presents with  . Abdominal Pain    that started about 2 weeks ago and more frequent and worsening. No fever. Tried medication (doesn't know naem) that Kristy [name] gave her for bladder spasm, but not helping.     She is having pain in her lower abdomen, started on the left side, but has spread to now hurt across the whole lower abdomen.  Pain comes and goes, not constant.  "almost like contractions".  When it is most severe, she feels some pain in her upper legs. Pain started about 2 weeks ago, getting worse.  Sometimes is throbbing like a toothache.  Tylenol helps some (1000mg ). Today has been the worst.  She had a good bowel movement this morning after using a Fleet's suppository.  (used one because she knew she wouldn't be at home during the day, didn't want to use bathroom elsewhere; denies constipation)  Prior BM was yesterday, twice, without suppository.  She takes probiotics and fiber.  Eats a lot of vegetables, more recently (trying to lose weight).  Gas pains usually bother her in the back, not her stomach. She tried Gas-X yest or day before, thinks it helped some, but the pain recurred.  She thought her pain was related to her diet.  She had ice cream yesterday, which didn't bother her. Today she had caffeinated tea with probiotics. Had bagel with cream cheese.  Has been eating  beans/greens/vegetables.  She reports some urinary urgency and frequency, thinks due to her tea.  Denies dysuria, odor, or visible blood in the urine.  She has more pain if she holds her bladder full, feels better after voiding, relieves pain. No vaginal discharge, odor or itch.  Denies sexual relationship, denies possibility for STD (over a month ago, used condom).  She tried taking a bladder spasm medication that she had gotten in the past, and also took leftover macrobid (from her daughter) today, 1 dose.  Didn't notice any improvement.  She has had an ovarian cyst, noted when tubes were tied. Denies problems related to it.  She sees GYN regularly, though not in the last year.   She saw Kristy [name] in July 2019 with lower abdominal pain x 2 weeks, similar symptoms. She declined pelvic exam at that time. Urine culture was negative, and symptoms had resolved. Hadn't had any problems since, until now.  PMH, PSH, SH reviewed  Outpatient Encounter Medications as of 11/05/2018  Medication Sig Note  . cetirizine (ZYRTEC) 10 MG tablet Take 1 tablet (10 mg total) by mouth daily. (Patient not taking: Reported on 11/05/2018)   . omeprazole (PRILOSEC) 40 MG capsule TAKE 1 CAPSULE BY MOUTH EVERY DAY (Patient not taking: No sig reported)   . PROAIR HFA 108 (90 Base) MCG/ACT inhaler TAKE 2 PUFFS BY MOUTH EVERY 6 HOURS AS NEEDED FOR WHEEZE OR SHORTNESS OF BREATH (Patient not taking: Reported on 11/05/2018)   . [DISCONTINUED] amoxicillin (  AMOXIL) 875 MG tablet Take 1 tablet (875 mg total) by mouth 2 (two) times daily. 09/03/2018: Filled on 08-24-18 DS 10  . [DISCONTINUED] Aspirin-Acetaminophen-Caffeine (GOODY HEADACHE PO) Take 1 Package by mouth as needed (headache).   . [DISCONTINUED] guaiFENesin-dextromethorphan (ROBITUSSIN DM) 100-10 MG/5ML syrup Take 10 mLs by mouth every 4 (four) hours as needed for cough.    No facility-administered encounter medications on file as of 11/05/2018.    Allergies  Allergen  Reactions  . Iodinated Diagnostic Agents Itching   ROS:  No fever, chills, no nausea, vomiting, appetite is normal Bowels are normal overall. No blood or mucus in the stool.  No hematuria.  +mild urgency/frequency per HPI.  No URI symptoms, chest pain, shortness of breath, bleeding, bruising, rash, or other concerns.  She was asking about prior imaging studies.  Chart reviewed--most recent was CT angio, didn't show lower abdomen.  CT done in 2014, normal (for hematuria? Normal).    Observations/Objective:  Temp 97.8 F (36.6 C) (Oral)   Ht 5\' 5"  (1.651 m)   Wt 216 lb (98 kg)   BMI 35.94 kg/m   No menses--s/p ablation  Exam is limited due to virtual nature of the visit. She appears alert, oriented, and in no distress.  She is often rubbing her lower stomach, as this makes it feel better. She does not have any focal tenderness to palpation anywhere in abdomen--has ongoing mild discomfort in the lower abdomen, but not painful to press. Abdomen is obese.   Assessment and Plan:  Lower abdominal pain - Ddx reviewed and red flags for which she should seek f/u reviewed. Suspect gas; cannot r/o ovarian cyst, UTI (doubt) or other. Diet and meds reviewed.    No dairy Continue probiotics Cut back on raw vegetables and gas producing foods Use gas-X prn.  Red lfags Can try ibuprofen or aleve if tylenol not effective.   Follow Up Instructions:    I discussed the assessment and treatment plan with the patient. The patient was provided an opportunity to ask questions and all were answered. The patient agreed with the plan and demonstrated an understanding of the instructions.   The patient was advised to call back or seek an in-person evaluation if the symptoms worsen or if the condition fails to improve as anticipated.  I provided 35 minutes of non-face-to-face time during this encounter.   Vikki Ports, MD

## 2019-04-05 ENCOUNTER — Other Ambulatory Visit: Payer: Self-pay

## 2019-04-05 ENCOUNTER — Encounter: Payer: Self-pay | Admitting: Obstetrics and Gynecology

## 2019-04-05 ENCOUNTER — Ambulatory Visit (INDEPENDENT_AMBULATORY_CARE_PROVIDER_SITE_OTHER): Payer: BC Managed Care – PPO | Admitting: Obstetrics and Gynecology

## 2019-04-05 VITALS — BP 130/90 | Ht 65.0 in | Wt 234.0 lb

## 2019-04-05 DIAGNOSIS — R1032 Left lower quadrant pain: Secondary | ICD-10-CM

## 2019-04-05 DIAGNOSIS — R3121 Asymptomatic microscopic hematuria: Secondary | ICD-10-CM | POA: Diagnosis not present

## 2019-04-05 DIAGNOSIS — Z131 Encounter for screening for diabetes mellitus: Secondary | ICD-10-CM

## 2019-04-05 DIAGNOSIS — Z0000 Encounter for general adult medical examination without abnormal findings: Secondary | ICD-10-CM

## 2019-04-05 DIAGNOSIS — Z803 Family history of malignant neoplasm of breast: Secondary | ICD-10-CM

## 2019-04-05 DIAGNOSIS — Z9189 Other specified personal risk factors, not elsewhere classified: Secondary | ICD-10-CM

## 2019-04-05 DIAGNOSIS — Z01419 Encounter for gynecological examination (general) (routine) without abnormal findings: Secondary | ICD-10-CM

## 2019-04-05 DIAGNOSIS — Z13220 Encounter for screening for lipoid disorders: Secondary | ICD-10-CM

## 2019-04-05 DIAGNOSIS — Z1239 Encounter for other screening for malignant neoplasm of breast: Secondary | ICD-10-CM

## 2019-04-05 LAB — POCT URINALYSIS DIPSTICK
Bilirubin, UA: NEGATIVE
Blood, UA: NEGATIVE
Glucose, UA: NEGATIVE
Ketones, UA: NEGATIVE
Leukocytes, UA: NEGATIVE
Nitrite, UA: NEGATIVE
Protein, UA: NEGATIVE
Spec Grav, UA: 1.025 (ref 1.010–1.025)
pH, UA: 6 (ref 5.0–8.0)

## 2019-04-05 NOTE — Patient Instructions (Signed)
I value your feedback and entrusting us with your care. If you get a [facility] patient survey, I would appreciate you taking the time to let us know about your experience today. Thank you! 

## 2019-04-05 NOTE — Progress Notes (Signed)
Chief Complaint  Patient presents with  . Gynecologic Exam  . Urinary Tract Infection    pt would like UA due to blood in urine in the past (not current), frequency urinating at night, and some pelvic pain    HPI:      [name] is a 48 y.o. [id number] who LMP was No LMP recorded. Patient has had an ablation., presents today for her annual examination. Her menses are absent after novasure ablation. She does not have intermenstrual bleeding. Does have tolerable vasomotor sx.   Sex activity: occas sex active, contraception - tubal ligation.  Last Pap: April 06, 2015  Results were: no abnormalities /neg HPV DNA  Hx of STDs: none  Pt has had random, intermittent LLQ pains for the past 6 months. Pain is sharp and achy, and can be off and on for several days when it occurs. No aggrav factors but sx can last an hour before relief with tylenol. Not related to BMs (has BM every few days). No urin or vag sx.   She has a hx of BV and AV by One Swab 1/18. She had had recurrent BV sx all 2017 and 2018. Pt did cleocin tx weekly for 3 months 1/18 with sx relief. No recent sx. Triggered by sex in past.  Last mammogram: 11/12/17  Results were: normal--routine follow-up in 12 months.  There is a FH of breast cancer in her mom and MGM. There is no FH of ovarian cancer. The patient does do self-breast exams. Pt was BRCA neg 5/14. Myrisk neg 2019. IBIS=25.8%. Pt still has LT breast lump for several yrs but not seen on last few mammograms. Lump is tender at times and she has noticed it again for past month, neg clinical exams in past; she drinks caffeine.   Tobacco use: The patient denies current or previous tobacco use. Alcohol use: social drinker No drug use. Exercise: moderately active  She does get adequate calcium but not Vitamin D in her diet.  Hx of microscopic hematuria in past. Saw PCP and urology with neg eval. Wants urine checked today. Doesn't see blood in urine.    Borderline lipids and Vit D deficiency 2019. Not taking supp. Due for fasting labs  Past Medical History:  Diagnosis Date  . BRCA negative 11/2012, 4/19   BRCA neg 2014; MyRisk update neg except POLE VUS 4/19  . Dysrhythmia    a. 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 Family history of breast cancer   . H. pylori infection 10/2010  . Increased risk of breast cancer 11/2012; 4/19   5/14 IBIS=25.6%; 4/19 IBIS=25.8%  . Iron deficiency anemia, unspecified   . Obesity   . Seasonal allergies    ragweed, tree pollen, mild and dust per testing 08/2012  . Unspecified vitamin D deficiency   . Vitamin D deficiency 2017    Past Surgical History:  Procedure Laterality Date  . BREAST BIOPSY Right 1980s   neg  . CESAREAN SECTION    . [place] N/A 09/14/2015   Procedure: DILATATION & CURETTAGE/HYSTEROSCOPY WITH NOVASURE ABLATION;  Surgeon: Gae Dry, MD;  Location: [facility];  Service: Gynecology;  Laterality: N/A;  . IUD REMOVAL N/A 09/14/2015   Procedure: INTRAUTERINE DEVICE (IUD) REMOVAL;  Surgeon: Gae Dry, MD;  Location: [facility];  Service: Gynecology;  Laterality: N/A;  . LAPAROSCOPIC BILATERAL SALPINGECTOMY Bilateral 09/14/2015   Procedure: LAPAROSCOPIC BILATERAL SALPINGECTOMY;  Surgeon: Gae Dry, MD;  Location: [facility];  Service: Gynecology;  Laterality: Bilateral;  . TUBAL LIGATION Bilateral 09/14/2015   Procedure: BILATERAL TUBAL LIGATION;  Surgeon: Gae Dry, MD;  Location: [facility];  Service: Gynecology;  Laterality: Bilateral;  . VULVAR LESION REMOVAL N/A 09/14/2015   Procedure: VULVAR LESION;  Surgeon: Gae Dry, MD;  Location: [facility];  Service: Gynecology;  Laterality: N/A;  . WISDOM TOOTH EXTRACTION      Family History  Problem Relation Age of Onset  . Heart disease Mother 49       stent, CAD  . Breast cancer Mother 93       with recurrence about 7  . Cancer Father        esophageal cancer  .  Heart disease Father 69       died of MI  . Alcohol abuse Father   . Hypertension Sister   . Hypertension Brother   . CAD Brother        MI at age 68  . Hypertension Brother   . Breast cancer Maternal Grandmother 70  . Diabetes Neg Hx   . Stroke Neg Hx     Social History   Socioeconomic History  . Marital status: Single    Spouse name: Not on file  . Number of children: Not on file  . Years of education: Not on file  . Highest education level: Not on file  Occupational History  . Not on file  Social Needs  . Financial resource strain: Not on file  . Food insecurity    Worry: Not on file    Inability: Not on file  . Transportation needs    Medical: Not on file    Non-medical: Not on file  Tobacco Use  . Smoking status: Never Smoker  . Smokeless tobacco: Never Used  Substance and Sexual Activity  . Alcohol use: No    Comment: maybe one drink a year  . Drug use: No  . Sexual activity: Not Currently    Birth control/protection: Surgical    Comment: reports ablation and partner had vasectomy.   Lifestyle  . Physical activity    Days per week: Not on file    Minutes per session: Not on file  . Stress: Not on file  Relationships  . Social Herbalist on phone: Not on file    Gets together: Not on file    Attends religious service: Not on file    Active member of club or organization: Not on file    Attends meetings of clubs or organizations: Not on file    Relationship status: Not on file  . Intimate partner violence    Fear of current or ex partner: Not on file    Emotionally abused: Not on file    Physically abused: Not on file    Forced sexual activity: Not on file  Other Topics Concern  . Not on file  Social History Narrative   In relationship x 6 mo, divorced, 1 child age 70 yo, exercise with classes, running, works as a Probation officer    Current Outpatient Medications on File Prior to Visit  Medication Sig Dispense Refill  . cetirizine (ZYRTEC)  10 MG tablet Take 1 tablet (10 mg total) by mouth daily. (Patient not taking: Reported on 11/05/2018) 30 tablet 2  . omeprazole (PRILOSEC) 40 MG capsule TAKE 1 CAPSULE BY MOUTH EVERY DAY (Patient not taking: No sig reported) 30 capsule 1  . PROAIR HFA  108 (90 Base) MCG/ACT inhaler TAKE 2 PUFFS BY MOUTH EVERY 6 HOURS AS NEEDED FOR WHEEZE OR SHORTNESS OF BREATH (Patient not taking: Reported on 11/05/2018) 8.5 Inhaler 0   No current facility-administered medications on file prior to visit.      ROS:  Review of Systems  Constitutional: Negative for fatigue, fever and unexpected weight change.  Respiratory: Negative for cough, shortness of breath and wheezing.   Cardiovascular: Negative for chest pain, palpitations and leg swelling.  Gastrointestinal: Negative for blood in stool, constipation, diarrhea, nausea and vomiting.  Endocrine: Negative for cold intolerance, heat intolerance and polyuria.  Genitourinary: Positive for pelvic pain. Negative for dyspareunia, dysuria, flank pain, frequency, genital sores, hematuria, menstrual problem, urgency, vaginal bleeding, vaginal discharge and vaginal pain.  Musculoskeletal: Negative for back pain, joint swelling and myalgias.  Skin: Negative for rash.  Neurological: Negative for dizziness, syncope, light-headedness, numbness and headaches.  Hematological: Negative for adenopathy.  Psychiatric/Behavioral: Negative for agitation, confusion, sleep disturbance and suicidal ideas. The patient is not nervous/anxious.   BREAST: lump and tenderness   Objective: BP 130/90   Ht 5' 5" (1.651 m)   Wt 234 lb (106.1 kg)   BMI 38.94 kg/m    Physical Exam Constitutional:      Appearance: She is well-developed.  Genitourinary:     Vulva, vagina, uterus, right adnexa and left adnexa normal.     No vulval lesion or tenderness noted.     No vaginal discharge, erythema or tenderness.     No cervical motion tenderness or polyp.     Uterus is not enlarged or  tender.     No right or left adnexal mass present.     Right adnexa not tender.     Left adnexa not tender.  Neck:     Musculoskeletal: Normal range of motion.     Thyroid: No thyromegaly.  Cardiovascular:     Rate and Rhythm: Normal rate and regular rhythm.     Heart sounds: Normal heart sounds. No murmur.  Pulmonary:     Effort: Pulmonary effort is normal.     Breath sounds: Normal breath sounds.  Chest:     Breasts:        Right: No mass, nipple discharge, skin change or tenderness.        Left: Tenderness present. No mass, nipple discharge or skin change.    Abdominal:     Palpations: Abdomen is soft.     Tenderness: There is no abdominal tenderness. There is no guarding or rebound.  Musculoskeletal: Normal range of motion.  Neurological:     General: No focal deficit present.     Mental Status: She is alert and oriented to person, place, and time.     Cranial Nerves: No cranial nerve deficit.  Skin:    General: Skin is warm and dry.  Psychiatric:        Mood and Affect: Mood normal.        Behavior: Behavior normal.        Thought Content: Thought content normal.        Judgment: Judgment normal.  Vitals signs reviewed.     Results: Results for orders placed or performed in visit on 04/05/19 (from the past 24 hour(s))  POCT Urinalysis Dipstick     Status: Normal   Collection Time: 04/05/19  2:46 PM  Result Value Ref Range   Color, UA yellow    Clarity, UA clear    Glucose, UA Negative Negative  Bilirubin, UA neg    Ketones, UA neg    Spec Grav, UA 1.025 1.010 - 1.025   Blood, UA neg    pH, UA 6.0 5.0 - 8.0   Protein, UA Negative Negative   Urobilinogen, UA     Nitrite, UA neg    Leukocytes, UA Negative Negative   Appearance     Odor      Assessment/Plan: Encounter for annual routine gynecological examination  Screening for breast cancer - Plan: MM 3D SCREEN BREAST BILATERAL--pt to sched screening mammo. No LT breast mass on exam. D/C caffeine.    Family history of breast cancer--Pt is MyRisk neg.  Increased risk of breast cancer--IBIS=25%. Cont monthly SBE, yearly mammo and CBE, add Vit D supp. Discussed scr breast MRI and pt to f/u 1/21 if desires.   Asymptomatic microscopic hematuria - Plan: POCT Urinalysis Dipstick; neg UA today. Reassurance  LLQ pain - Plan: US PELVIS TRANSVAGINAL NON-OB (TV ONLY)--check u/s. Will f/u with results.   Blood tests for routine general physical examination - Plan: Lipid panel, Comprehensive metabolic panel, Hgb T2W w/o eAG  Screening cholesterol level - Plan: Lipid panel  Screening for diabetes mellitus - Plan: Hgb A1c w/o eAG        GYN counsel breast self exam, mammography screening, adequate intake of calcium and vitamin D, diet and exercise     F/U  Return in about 1 day (around 04/06/2019) for GYN u/s for LLQ pain, ABC to call. /1 yr annual   B. , PA-C 04/05/2019 2:49 PM

## 2019-04-06 ENCOUNTER — Ambulatory Visit (INDEPENDENT_AMBULATORY_CARE_PROVIDER_SITE_OTHER): Payer: BC Managed Care – PPO

## 2019-04-06 ENCOUNTER — Other Ambulatory Visit: Payer: BC Managed Care – PPO

## 2019-04-06 ENCOUNTER — Telehealth: Payer: Self-pay | Admitting: Obstetrics and Gynecology

## 2019-04-06 DIAGNOSIS — D250 Submucous leiomyoma of uterus: Secondary | ICD-10-CM | POA: Diagnosis not present

## 2019-04-06 DIAGNOSIS — Z13220 Encounter for screening for lipoid disorders: Secondary | ICD-10-CM

## 2019-04-06 DIAGNOSIS — Z0000 Encounter for general adult medical examination without abnormal findings: Secondary | ICD-10-CM | POA: Diagnosis not present

## 2019-04-06 DIAGNOSIS — D251 Intramural leiomyoma of uterus: Secondary | ICD-10-CM

## 2019-04-06 DIAGNOSIS — R1032 Left lower quadrant pain: Secondary | ICD-10-CM | POA: Diagnosis not present

## 2019-04-06 DIAGNOSIS — D252 Subserosal leiomyoma of uterus: Secondary | ICD-10-CM | POA: Diagnosis not present

## 2019-04-06 DIAGNOSIS — Z131 Encounter for screening for diabetes mellitus: Secondary | ICD-10-CM | POA: Diagnosis not present

## 2019-04-06 NOTE — Telephone Encounter (Signed)
Pt aware of neg GYN u/s for LLQ pain. Question GI given hx of constipation. Increase fiber supp/lots of water to see if sx improve. F/u if persist. Could also be MSK and need pelvic PT ref.    ULTRASOUND REPORT  Location: [facility]  Date of Service: 04/06/2019    Indications:Pelvic Pain Findings:  The uterus is anteflexed and measures 7.7 x 6.0 x 4.5cm. Echo texture is heterogenous with evidence of focal masses.  Within the uterus are multiple suspected fibroids measuring: Fibroid 1:  1.5 x 1.6 x 1.4cm (RT/mid, IM) Fibroid 2:  1.4 x 1.4 x 1.1cm (RT/mid, IM) Fibroid 3:  2.2 x 2.0 x 2.0cm (mid, IM) Fibroid 4:  1.6 x 1.6 x 1.8cm (mid, SM) Fibroid 5:  2.0 x 2.2 x 2.0cm (LT/high, SS)   The Endometrium measures 4.2 mm.  Right Ovary measures 2.3 x 1.5 x 1.4 cm. It is normal in appearance. Left Ovary measures 2.9 x 1.8 x 1.4 cm. It is normal in appearance. Survey of the adnexa demonstrates no adnexal masses. There is no free fluid in the cul de sac.  Impression: 1. Multi-fibroid uterus.   Recommendations: 1.Clinical correlation with the patient's History and Physical Exam.   Vita Barley, RT

## 2019-04-07 LAB — LIPID PANEL
Chol/HDL Ratio: 4 ratio (ref 0.0–4.4)
Cholesterol, Total: 207 mg/dL — ABNORMAL HIGH (ref 100–199)
HDL: 52 mg/dL (ref >39)
LDL Chol Calc (NIH): 138 mg/dL — ABNORMAL HIGH (ref 0–99)
Triglycerides: 94 mg/dL (ref 0–149)
VLDL Cholesterol Cal: 17 mg/dL (ref 5–40)

## 2019-04-07 LAB — COMPREHENSIVE METABOLIC PANEL
ALT: 11 IU/L (ref 0–32)
AST: 16 IU/L (ref 0–40)
Albumin/Globulin Ratio: 1.4 (ref 1.2–2.2)
Albumin: 4 g/dL (ref 3.8–4.8)
Alkaline Phosphatase: 57 IU/L (ref 39–117)
BUN/Creatinine Ratio: 10 (ref 9–23)
BUN: 10 mg/dL (ref 6–24)
Bilirubin Total: 0.3 mg/dL (ref 0.0–1.2)
CO2: 26 mmol/L (ref 20–29)
Calcium: 9.1 mg/dL (ref 8.7–10.2)
Chloride: 104 mmol/L (ref 96–106)
Creatinine, Ser: 1.01 mg/dL — ABNORMAL HIGH (ref 0.57–1.00)
GFR calc Af Amer: 77 mL/min/{1.73_m2} (ref >59)
GFR calc non Af Amer: 67 mL/min/{1.73_m2} (ref >59)
Globulin, Total: 2.8 g/dL (ref 1.5–4.5)
Glucose: 53 mg/dL — ABNORMAL LOW (ref 65–99)
Potassium: 4.3 mmol/L (ref 3.5–5.2)
Sodium: 140 mmol/L (ref 134–144)
Total Protein: 6.8 g/dL (ref 6.0–8.5)

## 2019-04-07 LAB — HGB A1C W/O EAG: Hgb A1c MFr Bld: 5.6 % (ref 4.8–5.6)

## 2019-04-23 ENCOUNTER — Other Ambulatory Visit: Payer: Self-pay | Admitting: Medical

## 2019-05-11 ENCOUNTER — Encounter: Payer: Self-pay | Admitting: Obstetrics and Gynecology

## 2019-05-11 ENCOUNTER — Ambulatory Visit
Admission: RE | Admit: 2019-05-11 | Discharge: 2019-05-11 | Disposition: A | Discharge status: Home / Self Care | Payer: BC Managed Care – PPO | Source: Ambulatory Visit | Attending: Obstetrics and Gynecology | Admitting: Obstetrics and Gynecology

## 2019-05-11 DIAGNOSIS — Z1239 Encounter for other screening for malignant neoplasm of breast: Secondary | ICD-10-CM

## 2019-05-11 DIAGNOSIS — Z1231 Encounter for screening mammogram for malignant neoplasm of breast: Secondary | ICD-10-CM | POA: Diagnosis not present

## 2019-05-12 IMAGING — MG MM DIGITAL DIAGNOSTIC BILAT W/ TOMO W/ CAD
6 of 10 series · 6 of 30 positions shown · non-contrast
Comparison: 04/06/2015 and earlier

ACR Breast Density Category a: The breast tissue is almost entirely
fatty.

CLINICAL DATA: Patient has a tender palpable abnormality in the
UPPER portion of the LEFT breast, first noted in 1338.

EXAM:
DIGITAL DIAGNOSTIC BILATERAL MAMMOGRAM WITH CAD AND TOMO
ULTRASOUND LEFT BREAST

[L CC synth-2D]
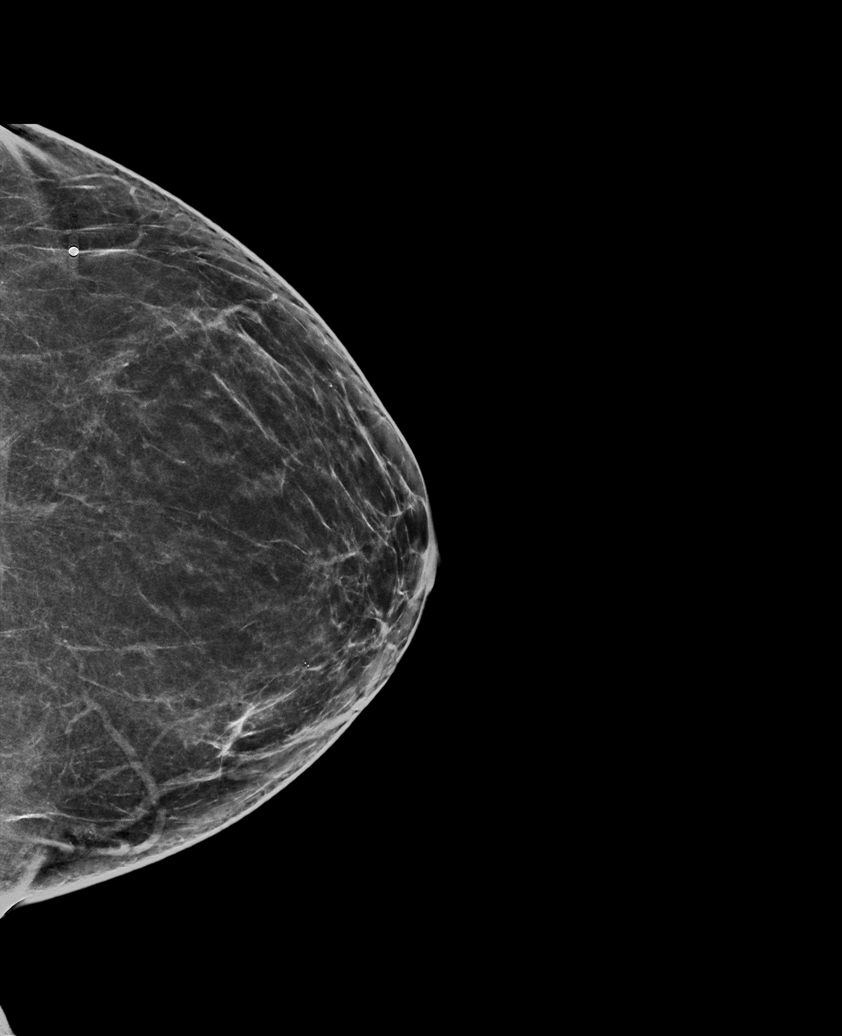

[L TAN synth-2D]
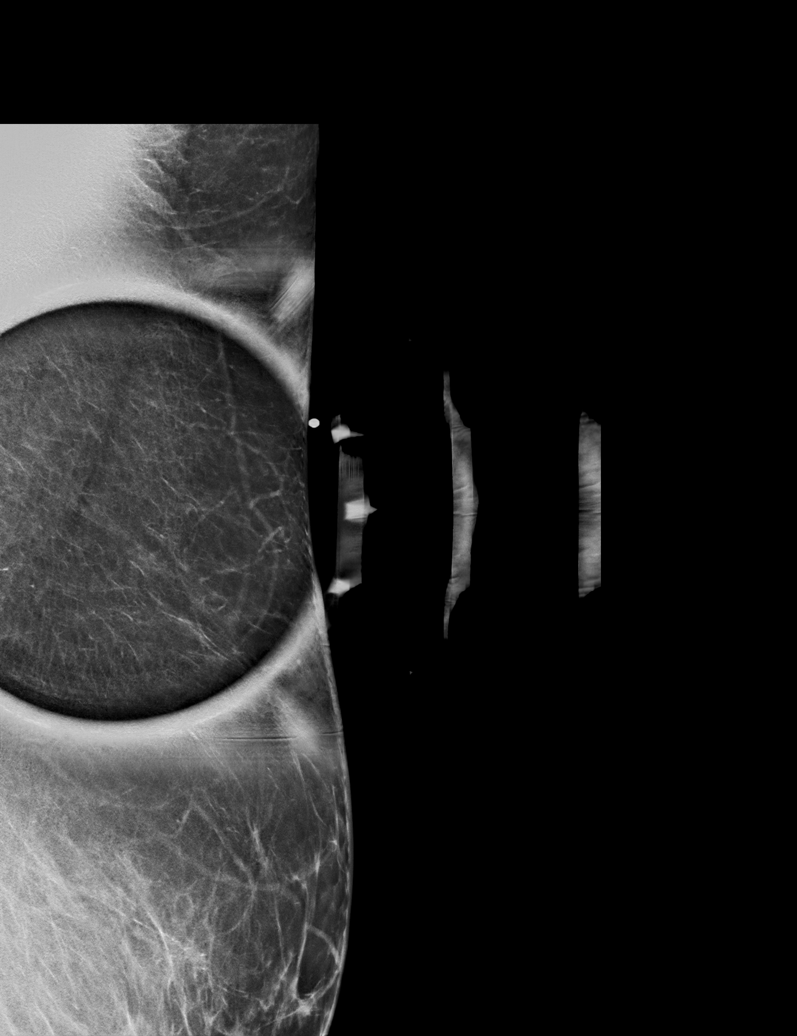

[R CC synth-2D]
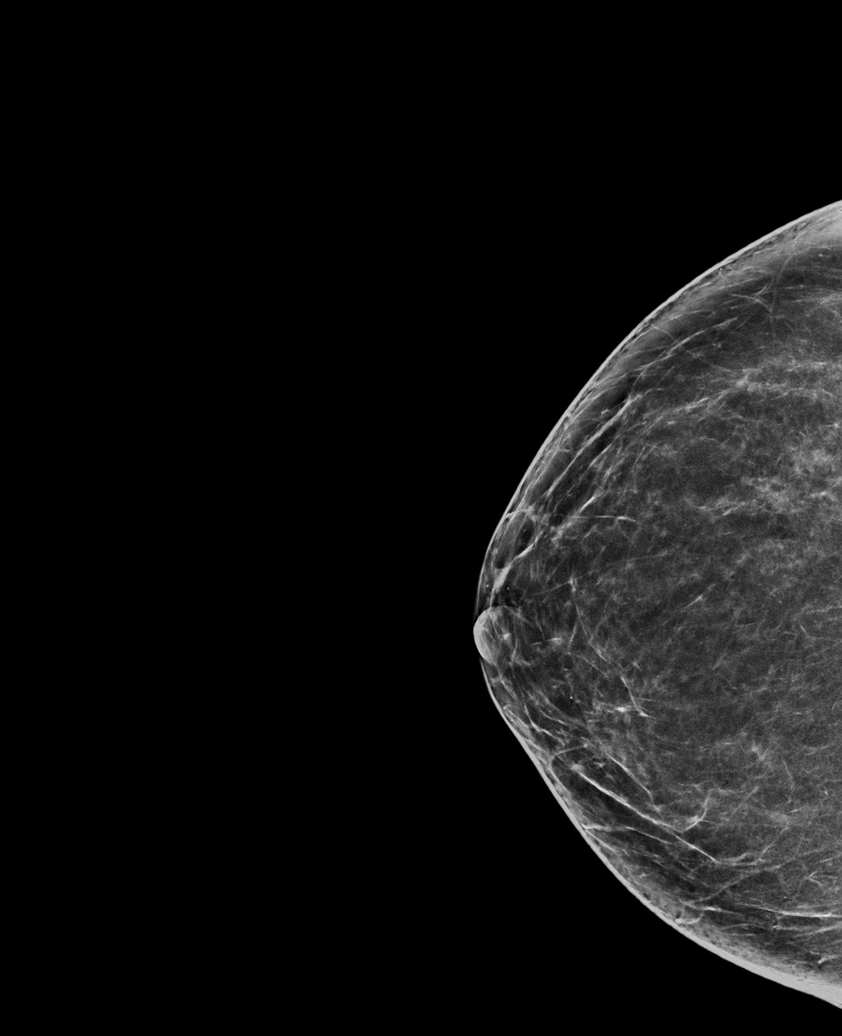

[R MLO synth-2D]
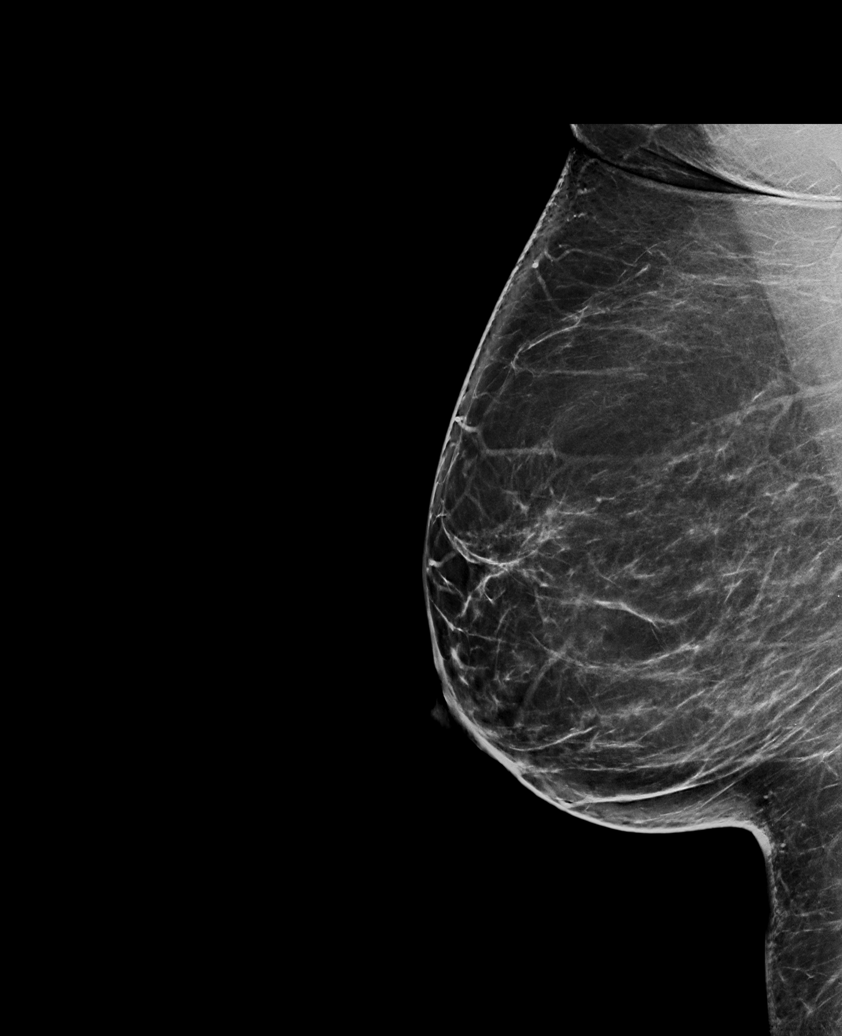

[L MLO synth-2D]
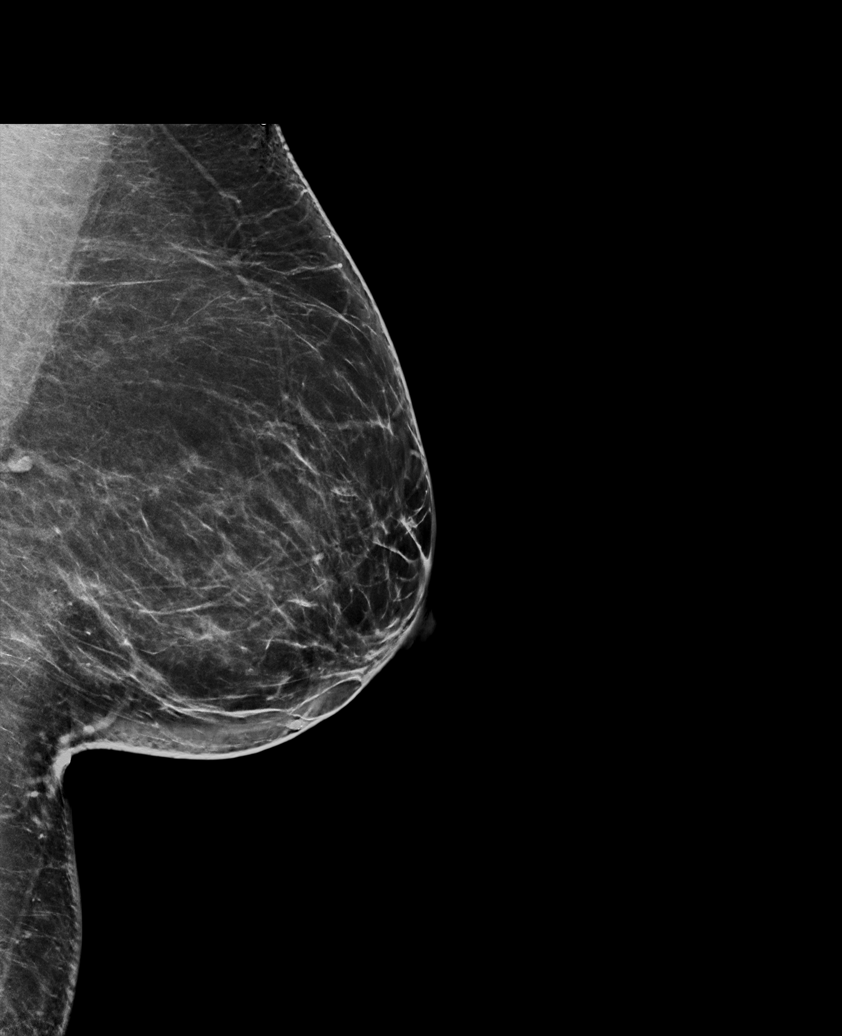

[L MLO tomo · tomo slice 42/83.0]
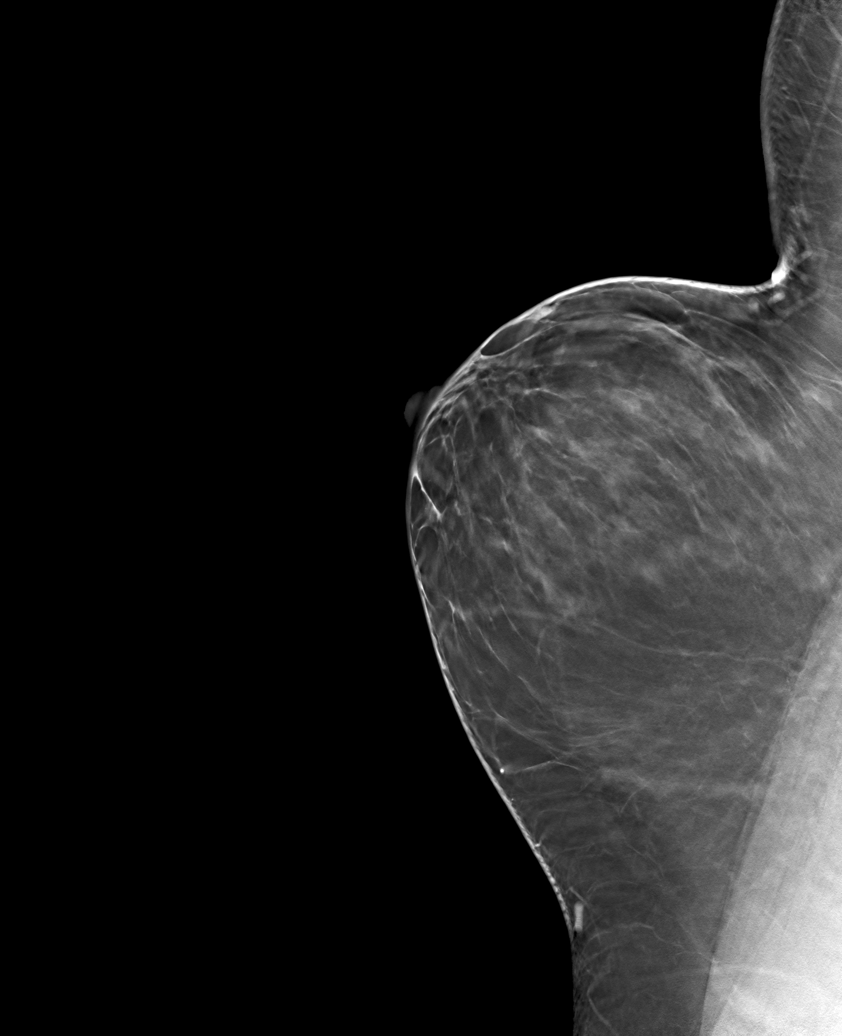

[6 of 30 positions shown; findings below may reference images not displayed]

FINDINGS: No suspicious mass, distortion, or microcalcifications are
identified to suggest presence of malignancy. Spot tangential view
shows normal appearing fibrofatty tissue in the area of patient's
concern.

Mammographic images were processed with CAD.

On physical exam, I palpate focal soft thickening in the UPPER-OUTER
QUADRANT of the LEFT breast in the area of concern to the patient. I
palpate no discrete mass in this region.

Targeted ultrasound is performed, showing normal appearing
fibroglandular tissue throughout the UPPER OUTER QUADRANT of the
LEFT breast. Lymph nodes with normal morphology are identified
within the LEFT axilla. No suspicious mass, distortion, or acoustic
shadowing is demonstrated with ultrasound.
IMPRESSION: No mammographic or ultrasound evidence for malignancy.

RECOMMENDATION:
Screening mammogram in one year.(Code:DR-8-0TP)

I have discussed the findings and recommendations with the patient.
Results were also provided in writing at the conclusion of the
visit. If applicable, a reminder letter will be sent to the patient
regarding the next appointment.

BI-RADS CATEGORY  1: Negative.

## 2019-05-12 IMAGING — US US BREAST*L* LIMITED INC AXILLA
1 series · 1 of 1 positions shown · non-contrast
Comparison: 04/06/2015 and earlier

ACR Breast Density Category a: The breast tissue is almost entirely
fatty.

CLINICAL DATA: Patient has a tender palpable abnormality in the
UPPER portion of the LEFT breast, first noted in 1338.

EXAM:
DIGITAL DIAGNOSTIC BILATERAL MAMMOGRAM WITH CAD AND TOMO
ULTRASOUND LEFT BREAST

[Series 1: us breast*left* limited inc axilla · 0.07mm/px · 1 of 1 slices shown]
[im 1/1]
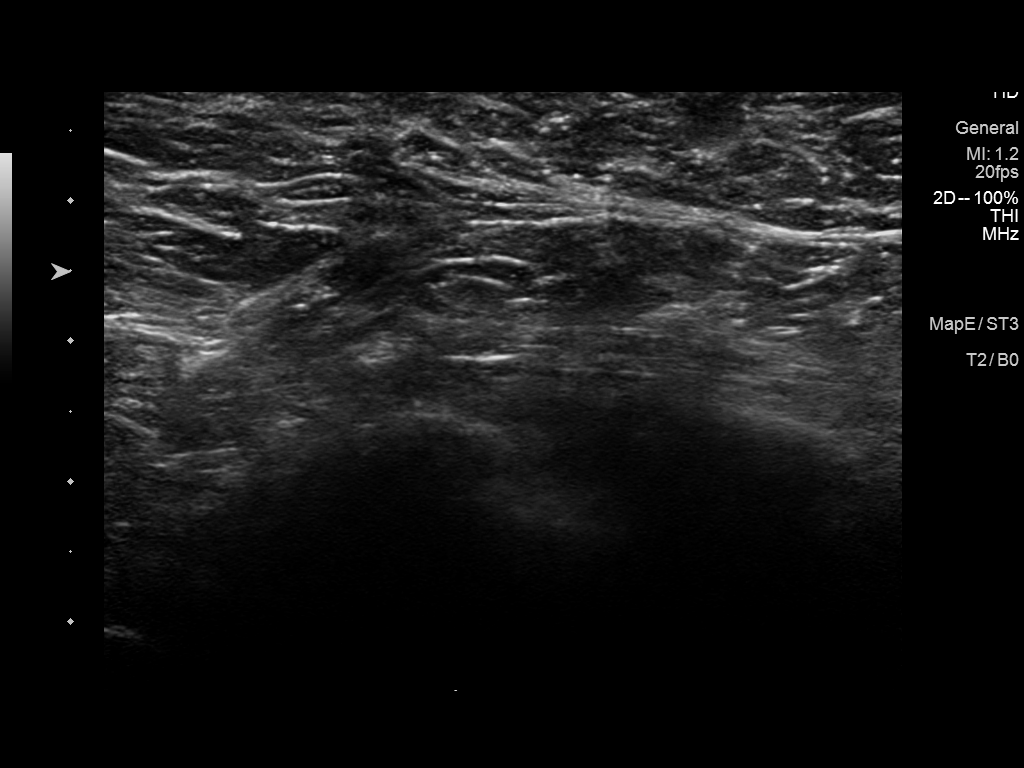

[1 of 1 positions shown; findings below may reference images not displayed]

FINDINGS: No suspicious mass, distortion, or microcalcifications are
identified to suggest presence of malignancy. Spot tangential view
shows normal appearing fibrofatty tissue in the area of patient's
concern.

Mammographic images were processed with CAD.

On physical exam, I palpate focal soft thickening in the UPPER-OUTER
QUADRANT of the LEFT breast in the area of concern to the patient. I
palpate no discrete mass in this region.

Targeted ultrasound is performed, showing normal appearing
fibroglandular tissue throughout the UPPER OUTER QUADRANT of the
LEFT breast. Lymph nodes with normal morphology are identified
within the LEFT axilla. No suspicious mass, distortion, or acoustic
shadowing is demonstrated with ultrasound.
IMPRESSION: No mammographic or ultrasound evidence for malignancy.

RECOMMENDATION:
Screening mammogram in one year.(Code:DR-8-0TP)

I have discussed the findings and recommendations with the patient.
Results were also provided in writing at the conclusion of the
visit. If applicable, a reminder letter will be sent to the patient
regarding the next appointment.

BI-RADS CATEGORY  1: Negative.

## 2019-06-28 ENCOUNTER — Other Ambulatory Visit: Payer: Self-pay

## 2019-06-28 ENCOUNTER — Ambulatory Visit: Payer: BC Managed Care – PPO | Admitting: Medical

## 2019-06-28 ENCOUNTER — Encounter: Payer: Self-pay | Admitting: Medical

## 2019-06-28 VITALS — BP 112/84 | HR 79 | Temp 98.6°F | Ht 65.0 in | Wt 229.4 lb

## 2019-06-28 DIAGNOSIS — R143 Flatulence: Secondary | ICD-10-CM | POA: Diagnosis not present

## 2019-06-28 DIAGNOSIS — J301 Allergic rhinitis due to pollen: Secondary | ICD-10-CM

## 2019-06-28 DIAGNOSIS — K219 Gastro-esophageal reflux disease without esophagitis: Secondary | ICD-10-CM | POA: Insufficient documentation

## 2019-06-28 DIAGNOSIS — D259 Leiomyoma of uterus, unspecified: Secondary | ICD-10-CM

## 2019-06-28 DIAGNOSIS — K5909 Other constipation: Secondary | ICD-10-CM

## 2019-06-28 DIAGNOSIS — D509 Iron deficiency anemia, unspecified: Secondary | ICD-10-CM | POA: Diagnosis not present

## 2019-06-28 DIAGNOSIS — R609 Edema, unspecified: Secondary | ICD-10-CM

## 2019-06-28 MED ORDER — CETIRIZINE HCL 10 MG PO TABS: 10.0000 mg | ORAL_TABLET | Freq: Every day | ORAL | 3 refills | Status: DC

## 2019-06-28 MED ORDER — OMEPRAZOLE 40 MG PO CPDR: DELAYED_RELEASE_CAPSULE | ORAL | 3 refills | Status: DC

## 2019-06-28 MED ORDER — TRULANCE 3 MG PO TABS: 1.0000 | ORAL_TABLET | Freq: Every day | ORAL | 0 refills | Status: DC

## 2019-06-28 MED ORDER — HYDROCHLOROTHIAZIDE 12.5 MG PO TABS: 12.5000 mg | ORAL_TABLET | Freq: Every day | ORAL | 1 refills | Status: DC

## 2019-06-28 NOTE — Progress Notes (Signed)
Subjective: Chief Complaint  Patient presents with  . Follow-up    wants to check iron and vit d    Here for several concerns.  History of iron deficiency-wants iron checked due to history of iron deficiency anemia.   Started taking iron gummies 3 weeks ago.  Had uterine ablation 5 years ago, and no period since then.   No blood in stool of late.      Vitamin D deficiency-she is taking D3 supplement 2000u daily.  GERD-needs refill medication.  Does fine on this  Chronic allergies-needs refill on medication  Gets really gassy of late, bloated.  Wants to know what she can do for this.   Has BM about every 2-3 days.  Uses OTC laxative once every week on average.   No prior colonoscopy.  If she takes something for constipation she can also get loose stool or less formed stool with bowel movements the day she does have a bowel movement, but if she does not take anything the stool can be solid and harder.  Has lower extremity mild swelling several days per week.  Stands on her feet all day at work.  No chest pain or shortness of breath.    No other aggravating or relieving factors. No other complaint.  Past Medical History:  Diagnosis Date  . Allergy   . BRCA negative 11/2012, 4/19   BRCA neg 2014; MyRisk update neg except POLE VUS 4/19  . Dysrhythmia    a. 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 Family history of breast cancer   . GERD (gastroesophageal reflux disease)   . H. pylori infection 10/2010  . Increased risk of breast cancer 11/2012; 4/19   5/14 IBIS=25.6%; 4/19 IBIS=25.8%  . Iron deficiency anemia, unspecified   . Obesity   . Seasonal allergies    ragweed, tree pollen, mild and dust per testing 08/2012  . Unspecified vitamin D deficiency   . Vitamin D deficiency 2017   Current Outpatient Medications on File Prior to Visit  Medication Sig Dispense Refill  . Cholecalciferol (D3-1000) 25 MCG (1000 UT) tablet Take 2,000 Units by mouth daily.    Marland Kitchen PROAIR HFA 108 (90 Base)  MCG/ACT inhaler TAKE 2 PUFFS BY MOUTH EVERY 6 HOURS AS NEEDED FOR WHEEZE OR SHORTNESS OF BREATH (Patient not taking: Reported on 11/05/2018) 8.5 Inhaler 0   No current facility-administered medications on file prior to visit.   ROS as in subjective     Objective: BP 112/84   Pulse 79   Temp 98.6 F (37 C)   Ht _0  (1.651 m)   Wt 229 lb 6.4 oz (104.1 kg)   SpO2 99%   BMI 38.17 kg/m   Wt Readings from Last 3 Encounters:  06/28/19 229 lb 6.4 oz (104.1 kg)  04/05/19 234 lb (106.1 kg)  11/05/18 216 lb (98 kg)   Gen: wd, wn, nad Neck supple, nontender, no thyromegaly no mass no lymphadenopathy Lungs clear Heart regular rate and rhythm, normal S1-S2 no murmurs Abdomen: Positive bowel sounds, increased bowel sounds, soft, mild generalized discomfort with pressure but no reported pain   Assessment: Encounter Diagnoses  Name Primary?  . Iron deficiency anemia, unspecified iron deficiency anemia type Yes  . Chronic constipation   . Flatulence   . Uterine leiomyoma, unspecified location   . Edema, unspecified type   . Gastroesophageal reflux disease, unspecified whether esophagitis present   . Allergic rhinitis due to pollen, unspecified seasonality     Plan: Iron  deficiency anemia-repeat labs today for CBC and iron.  Her labs were normal earlier in the year.  She just recently started back taking an iron supplement  Chronic constipation-begin trial of Trulance.  Discussed water and fiber intake.  Call report in 2 weeks, consider baseline diagnostic colonoscopy, or screening if insurance allows since she is over 79 years old  Flatulence-gave a sample of probiotic she can use but after she does a trial of Trulance for least a week.  Avoid gas causing foods such as pinto beans, onions, fried foods, processed foods  Uterine fibroids, status post ablation procedure, no periods in recent years.  Sees gynecology  Edema-advised regular exercise, can wear over-the-counter  compression hose, begin low-dose diuretic, plan to recheck electrolytes in 2 weeks  GERD-doing fine on current medications, refills as below, discussed risk and benefits of medication  Allergic rhinitis-refill medications in the  Kamren was seen today for follow-up.  Diagnoses and all orders for this visit:  Iron deficiency anemia, unspecified iron deficiency anemia type -     CBC with Differential -     Iron -     Ferritin  Chronic constipation -     CBC with Differential  Flatulence  Uterine leiomyoma, unspecified location  Edema, unspecified type  Gastroesophageal reflux disease, unspecified whether esophagitis present  Allergic rhinitis due to pollen, unspecified seasonality  Other orders -     omeprazole (PRILOSEC) 40 MG capsule; TAKE 1 CAPSULE BY MOUTH EVERY DAY -     cetirizine (ZYRTEC) 10 MG tablet; Take 1 tablet (10 mg total) by mouth daily. -     hydrochlorothiazide (HYDRODIURIL) 12.5 MG tablet; Take 1 tablet (12.5 mg total) by mouth daily. -     Plecanatide (TRULANCE) 3 MG TABS; Take 1 tablet by mouth daily.

## 2019-06-29 LAB — CBC WITH DIFFERENTIAL/PLATELET
Basophils Absolute: 0 10*3/uL (ref 0.0–0.2)
Basos: 1 %
EOS (ABSOLUTE): 0.3 10*3/uL (ref 0.0–0.4)
Eos: 8 %
Hematocrit: 37.3 % (ref 34.0–46.6)
Hemoglobin: 12.2 g/dL (ref 11.1–15.9)
Immature Grans (Abs): 0 10*3/uL (ref 0.0–0.1)
Immature Granulocytes: 0 %
Lymphocytes Absolute: 1.6 10*3/uL (ref 0.7–3.1)
Lymphs: 39 %
MCH: 28.9 pg (ref 26.6–33.0)
MCHC: 32.7 g/dL (ref 31.5–35.7)
MCV: 88 fL (ref 79–97)
Monocytes Absolute: 0.3 10*3/uL (ref 0.1–0.9)
Monocytes: 6 %
Neutrophils Absolute: 2 10*3/uL (ref 1.4–7.0)
Neutrophils: 46 %
Platelets: 253 10*3/uL (ref 150–450)
RBC: 4.22 x10E6/uL (ref 3.77–5.28)
RDW: 13.6 % (ref 11.7–15.4)
WBC: 4.2 10*3/uL (ref 3.4–10.8)

## 2019-06-29 LAB — FERRITIN: Ferritin: 88 ng/mL (ref 15–150)

## 2019-06-29 LAB — IRON: Iron: 89 ug/dL (ref 27–159)

## 2019-07-06 ENCOUNTER — Ambulatory Visit: Payer: BC Managed Care – PPO | Admitting: Obstetrics and Gynecology

## 2019-07-08 ENCOUNTER — Ambulatory Visit: Payer: BC Managed Care – PPO | Admitting: Obstetrics and Gynecology

## 2019-07-12 ENCOUNTER — Ambulatory Visit: Payer: BC Managed Care – PPO | Admitting: Medical

## 2019-07-13 ENCOUNTER — Encounter: Payer: Self-pay | Admitting: Medical

## 2019-07-28 ENCOUNTER — Ambulatory Visit: Payer: 59 | Admitting: Medical

## 2019-07-28 ENCOUNTER — Ambulatory Visit
Admission: RE | Admit: 2019-07-28 | Discharge: 2019-07-28 | Disposition: A | Discharge status: Home / Self Care | Payer: 59 | Source: Ambulatory Visit | Attending: Medical | Admitting: Medical

## 2019-07-28 ENCOUNTER — Encounter: Payer: Self-pay | Admitting: Medical

## 2019-07-28 ENCOUNTER — Other Ambulatory Visit: Payer: Self-pay

## 2019-07-28 VITALS — BP 124/80 | HR 79 | Temp 98.4°F | Ht 65.0 in | Wt 214.6 lb

## 2019-07-28 DIAGNOSIS — Z1389 Encounter for screening for other disorder: Secondary | ICD-10-CM | POA: Diagnosis not present

## 2019-07-28 DIAGNOSIS — Z130 Encounter for screening for diseases of the blood and blood-forming organs and certain disorders involving the immune mechanism: Secondary | ICD-10-CM

## 2019-07-28 DIAGNOSIS — Z01818 Encounter for other preprocedural examination: Secondary | ICD-10-CM

## 2019-07-28 DIAGNOSIS — E669 Obesity, unspecified: Secondary | ICD-10-CM

## 2019-07-28 DIAGNOSIS — I493 Ventricular premature depolarization: Secondary | ICD-10-CM

## 2019-07-28 DIAGNOSIS — D509 Iron deficiency anemia, unspecified: Secondary | ICD-10-CM

## 2019-07-28 DIAGNOSIS — Z114 Encounter for screening for human immunodeficiency virus [HIV]: Secondary | ICD-10-CM

## 2019-07-28 DIAGNOSIS — R7301 Impaired fasting glucose: Secondary | ICD-10-CM | POA: Diagnosis not present

## 2019-07-28 DIAGNOSIS — Z8249 Family history of ischemic heart disease and other diseases of the circulatory system: Secondary | ICD-10-CM

## 2019-07-28 LAB — POCT URINALYSIS DIP (PROADVANTAGE DEVICE)
Bilirubin, UA: NEGATIVE
Glucose, UA: NEGATIVE mg/dL
Ketones, POC UA: NEGATIVE mg/dL
Leukocytes, UA: NEGATIVE
Nitrite, UA: NEGATIVE
Protein Ur, POC: NEGATIVE mg/dL
Specific Gravity, Urine: 1.025
Urobilinogen, Ur: NEGATIVE
pH, UA: 6 (ref 5.0–8.0)

## 2019-07-28 NOTE — Progress Notes (Signed)
Subjective:   HPI  Kristy [name] is a 49 y.o. female who presents for Chief Complaint  Patient presents with  . Follow-up    blood work for surgical clearance     Patient Care Team: Gracelynne Benedict, Leward Quan as PCP - General (Family Medicine) Sees dentist Sees eye doctor  Concerns: Here for preop today.  She is having plastic surgery for some excess fat the removed from the hips on August 17, 2019.  Surgery will take place at [facility] in [place].  Several of her friends have had work done there and had good outcomes a good experience.  She notes no current problems.  Feeling healthy.  The cosmetic surgery clinic asked her to stop all nonessential medicines right now until after surgery.  So she stopped her vitamin D and fluid pill recently as the fluid pill is as needed.  She is only taking Prilosec omeprazole as needed.  No recent chest pain, difficulty breathing, no bleeding or bruising, no problems with urination or stooling.  Past Medical History:  Diagnosis Date  . Allergy   . BRCA negative 11/2012, 4/19   BRCA neg 2014; MyRisk update neg except POLE VUS 4/19  . Dysrhythmia    a. 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 Family history of breast cancer   . Family history of premature CAD   . GERD (gastroesophageal reflux disease)   . H. pylori infection 10/2010  . H/O exercise stress test 2018  . Increased risk of breast cancer 11/2012; 4/19   5/14 IBIS=25.6%; 4/19 IBIS=25.8%  . Iron deficiency anemia, unspecified    history of, normal hemoglobin 06/2019  . Obesity   . Seasonal allergies    ragweed, tree pollen, mild and dust per testing 08/2012  . Unspecified vitamin D deficiency   . Vitamin D deficiency 2017    Past Surgical History:  Procedure Laterality Date  . BREAST BIOPSY Right 1980s   neg  . CESAREAN SECTION    . [place] N/A 09/14/2015   Procedure: DILATATION &  CURETTAGE/HYSTEROSCOPY WITH NOVASURE ABLATION;  Surgeon: Gae Dry, MD;  Location: [facility] ORS;  Service: Gynecology;  Laterality: N/A;  . IUD REMOVAL N/A 09/14/2015   Procedure: INTRAUTERINE DEVICE (IUD) REMOVAL;  Surgeon: Gae Dry, MD;  Location: [facility] OR[facility];  Service: Gynecology;  Laterality: N/A;  . LAPAROSCOPIC BILATERAL SALPINGECTOMY Bilateral 09/14/2015   Procedure: LAPAROSCOPIC BILATERAL SALPINGECTOMY;  Surgeon: Gae Dry, MD;  Location: [facility];  Service: Gynecology;  Laterality: Bilateral;  . TUBAL LIGATION Bilateral 09/14/2015   Procedure: BILATERAL TUBAL LIGATION;  Surgeon: Gae Dry, MD;  Location: [facility];  Service: Gynecology;  Laterality: Bilateral;  . VULVAR LESION REMOVAL N/A 09/14/2015   Procedure: VULVAR LESION;  Surgeon: Gae Dry, MD;  Location: [facility] OR[facility];  Service: Gynecology;  Laterality: N/A;  . WISDOM TOOTH EXTRACTION      Social History   Socioeconomic History  . Marital status: Single    Spouse name: Not on file  . Number of children: Not on file  . Years of education: Not on file  . Highest education level: Not on file  Occupational History  . Not on file  Tobacco Use  . Smoking status: Never Smoker  . Smokeless tobacco: Never Used  Substance and Sexual Activity  . Alcohol use: No    Comment: maybe one drink a year  . Drug use: No  . Sexual activity: Not Currently  Birth control/protection: Surgical    Comment: reports ablation and partner had vasectomy.   Other Topics Concern  . Not on file  Social History Narrative   In relationship x 6 mo, divorced, 1 child age 25 yo, exercise with classes, running, works as a Radiographer, therapeutic Strain:   . Difficulty of Paying Living Expenses: Not on file  Food Insecurity:   . Worried About Charity fundraiser in the Last Year: Not on file  . Ran Out of Food in the Last Year: Not on file  Transportation Needs:   . Lack of Transportation  (Medical): Not on file  . Lack of Transportation (Non-Medical): Not on file  Physical Activity:   . Days of Exercise per Week: Not on file  . Minutes of Exercise per Session: Not on file  Stress:   . Feeling of Stress : Not on file  Social Connections:   . Frequency of Communication with Friends and Family: Not on file  . Frequency of Social Gatherings with Friends and Family: Not on file  . Attends Religious Services: Not on file  . Active Member of Clubs or Organizations: Not on file  . Attends Archivist Meetings: Not on file  . Marital Status: Not on file  Intimate Partner Violence:   . Fear of Current or Ex-Partner: Not on file  . Emotionally Abused: Not on file  . Physically Abused: Not on file  . Sexually Abused: Not on file    Family History  Problem Relation Age of Onset  . Heart disease Mother 53       stent, CAD  . Breast cancer Mother 35       with recurrence about 59  . Cancer Father        esophageal cancer  . Heart disease Father 64       died of MI  . Alcohol abuse Father   . Hypertension Sister   . Hypertension Brother   . CAD Brother        MI at age 40  . Hypertension Brother   . Breast cancer Maternal Grandmother 62  . Diabetes Neg Hx   . Stroke Neg Hx       Allergies  Allergen Reactions  . Iodinated Diagnostic Agents Itching     Reviewed their medical, surgical, family, social, medication, and allergy history and updated chart as appropriate.   Review of Systems Constitutional: -fever, -chills, -sweats, -unexpected weight change, -decreased appetite, -fatigue Allergy: -sneezing, -itching, -congestion Dermatology: -changing moles, --rash, -lumps ENT: -runny nose, -ear pain, -sore throat, -hoarseness, -sinus pain, -teeth pain, - ringing in ears, -hearing loss, -nosebleeds Cardiology: -chest pain, -palpitations, -swelling, -difficulty breathing when lying flat, -waking up short of breath Respiratory: -cough, -shortness of breath,  -difficulty breathing with exercise or exertion, -wheezing, -coughing up blood Gastroenterology: -abdominal pain, -nausea, -vomiting, -diarrhea, -constipation, -blood in stool, -changes in bowel movement, -difficulty swallowing or eating Hematology: -bleeding, -bruising  Musculoskeletal: -joint aches, -muscle aches, -joint swelling, -back pain, -neck pain, -cramping, -changes in gait Ophthalmology: denies vision changes, eye redness, itching, discharge Urology: -burning with urination, -difficulty urinating, -blood in urine, -urinary frequency, -urgency, -incontinence Neurology: -headache, -weakness, -tingling, -numbness, -memory loss, -falls, -dizziness Psychology: -depressed mood, -agitation, -sleep problems Breast/gyn: -breast tendnerss, -discharge, -lumps, -vaginal discharge,- irregular periods, -heavy periods     Objective:  BP 124/80   Pulse 79   Temp 98.4 F (36.9 C)  Ht _0  (1.651 m)   Wt 214 lb 9.6 oz (97.3 kg)   SpO2 98%   BMI 35.71 kg/m   Wt Readings from Last 3 Encounters:  07/28/19 214 lb 9.6 oz (97.3 kg)  06/28/19 229 lb 6.4 oz (104.1 kg)  04/05/19 234 lb (106.1 kg)   General appearance: alert, no distress, WD/WN, African-American female Skin: No worrisome lesions Neck: supple, no lymphadenopathy, no thyromegaly, no masses, normal ROM, no bruits Chest: non tender, normal shape and expansion Heart: RRR, normal S1, S2, no murmurs Lungs: CTA bilaterally, no wheezes, rhonchi, or rales Abdomen: +bs, soft, non tender, non distended, no masses, no hepatomegaly, no splenomegaly, no bruits Back: non tender, normal ROM, no scoliosis Musculoskeletal: upper extremities non tender, no obvious deformity, normal ROM throughout, lower extremities non tender, no obvious deformity, normal ROM throughout Extremities: no edema, no cyanosis, no clubbing Pulses: 2+ symmetric, upper and lower extremities, normal cap refill Neurological: alert, oriented x 3, CN2-12 intact, strength  normal upper extremities and lower extremities, sensation normal throughout, DTRs 2+ throughout, no cerebellar signs, gait normal Psychiatric: normal affect, behavior normal, pleasant  Breast/gyn/rectal - deferred to gynecology   EKG: Indication preop, rate 69 bpm, PR 172 ms, QRS 90 ms, QTC 424 ms, axis 36 degrees, normal sinus rhythm, nonspecific T wave inversion in 3, no acute change from 2020 EKG  Assessment and Plan :   Encounter Diagnoses  Name Primary?  . Preop examination Yes  . PVC (premature ventricular contraction)   . Impaired fasting blood sugar   . Obesity with serious comorbidity, unspecified classification, unspecified obesity type   . Iron deficiency anemia, unspecified iron deficiency anemia type   . Screening for HIV (human immunodeficiency virus)   . Screening for hematuria or proteinuria   . Screening for blood disease   . Family history of premature CAD     EKG reviewed.  We will send for chest x-ray. Labs today as below Advised she continue with regular exercise, healthy diet.  She has lost weight recently with efforts at lifestyle changes  We discussed heart disease risk of premature family history of CAD.  All of her siblings have high blood pressure except for her.  We discussed cardiac calcium score in light of her last cholesterol panel just a few months ago.  We discussed the need for healthy low-fat diet and consider statin therapy as a preventative measure  Continue efforts at weight loss   Neaveh was seen today for follow-up.  Diagnoses and all orders for this visit:  Preop examination -     EKG 12-Lead -     DG Chest 2 View; Future -     Basic Metabolic Panel -     CBC -     HIV regular -     Protime-INR -     APTT -     hCG, serum, qualitative  PVC (premature ventricular contraction) -     EKG 12-Lead -     DG Chest 2 View; Future  Impaired fasting blood sugar -     Basic Metabolic Panel  Obesity with serious comorbidity,  unspecified classification, unspecified obesity type -     DG Chest 2 View; Future  Iron deficiency anemia, unspecified iron deficiency anemia type -     Basic Metabolic Panel -     CBC  Screening for HIV (human immunodeficiency virus) -     HIV regular  Screening for hematuria or proteinuria -  CBC  Screening for blood disease -     Protime-INR -     APTT  Family history of premature CAD    Follow-up pending labs, yearly for physical

## 2019-07-29 ENCOUNTER — Other Ambulatory Visit: Payer: Self-pay | Admitting: Medical

## 2019-07-29 LAB — HCG, SERUM, QUALITATIVE: hCG,Beta Subunit,Qual,Serum: NEGATIVE m[IU]/mL (ref <6)

## 2019-07-29 LAB — BASIC METABOLIC PANEL
BUN/Creatinine Ratio: 10 (ref 9–23)
BUN: 9 mg/dL (ref 6–24)
CO2: 23 mmol/L (ref 20–29)
Calcium: 9.3 mg/dL (ref 8.7–10.2)
Chloride: 100 mmol/L (ref 96–106)
Creatinine, Ser: 0.93 mg/dL (ref 0.57–1.00)
GFR calc Af Amer: 85 mL/min/{1.73_m2} (ref >59)
GFR calc non Af Amer: 74 mL/min/{1.73_m2} (ref >59)
Glucose: 84 mg/dL (ref 65–99)
Potassium: 4.1 mmol/L (ref 3.5–5.2)
Sodium: 139 mmol/L (ref 134–144)

## 2019-07-29 LAB — CBC
Hematocrit: 40.5 % (ref 34.0–46.6)
Hemoglobin: 13.3 g/dL (ref 11.1–15.9)
MCH: 29.3 pg (ref 26.6–33.0)
MCHC: 32.8 g/dL (ref 31.5–35.7)
MCV: 89 fL (ref 79–97)
Platelets: 298 10*3/uL (ref 150–450)
RBC: 4.54 x10E6/uL (ref 3.77–5.28)
RDW: 13.1 % (ref 11.7–15.4)
WBC: 4.1 10*3/uL (ref 3.4–10.8)

## 2019-07-29 LAB — PROTIME-INR
INR: 1 (ref 0.9–1.2)
Prothrombin Time: 10.9 s (ref 9.1–12.0)

## 2019-07-29 LAB — HIV ANTIBODY (ROUTINE TESTING W REFLEX): HIV Screen 4th Generation wRfx: NONREACTIVE

## 2019-07-29 LAB — APTT: aPTT: 29 s (ref 24–33)

## 2019-08-17 ENCOUNTER — Other Ambulatory Visit: Payer: Self-pay | Admitting: Medical

## 2019-08-17 ENCOUNTER — Telehealth: Payer: Self-pay | Admitting: Medical

## 2019-08-17 DIAGNOSIS — Z8249 Family history of ischemic heart disease and other diseases of the circulatory system: Secondary | ICD-10-CM

## 2019-08-17 DIAGNOSIS — R030 Elevated blood-pressure reading, without diagnosis of hypertension: Secondary | ICD-10-CM

## 2019-08-17 DIAGNOSIS — Z136 Encounter for screening for cardiovascular disorders: Secondary | ICD-10-CM | POA: Insufficient documentation

## 2019-08-17 DIAGNOSIS — R7301 Impaired fasting glucose: Secondary | ICD-10-CM

## 2019-08-17 NOTE — Progress Notes (Signed)
Ct

## 2019-08-17 NOTE — Telephone Encounter (Signed)
Please print the cardiac CT calcium score order, fax this and a cover sheet to Dr. Lysbeth Penner office, [facility].  Make a note on the cover sheet this is for the $125 chest CT cardiac score

## 2019-08-18 ENCOUNTER — Other Ambulatory Visit: Payer: Self-pay | Admitting: Medical

## 2019-08-18 DIAGNOSIS — R030 Elevated blood-pressure reading, without diagnosis of hypertension: Secondary | ICD-10-CM

## 2019-08-18 DIAGNOSIS — R5383 Other fatigue: Secondary | ICD-10-CM

## 2019-08-18 DIAGNOSIS — R002 Palpitations: Secondary | ICD-10-CM

## 2019-08-18 DIAGNOSIS — Z136 Encounter for screening for cardiovascular disorders: Secondary | ICD-10-CM

## 2019-08-18 DIAGNOSIS — Z8249 Family history of ischemic heart disease and other diseases of the circulatory system: Secondary | ICD-10-CM

## 2019-08-18 DIAGNOSIS — I493 Ventricular premature depolarization: Secondary | ICD-10-CM

## 2019-08-18 NOTE — Telephone Encounter (Signed)
I put order.  Please print to fax cardiology

## 2019-08-18 NOTE — Telephone Encounter (Signed)
I don't see the order. Please advise.

## 2020-01-19 ENCOUNTER — Ambulatory Visit: Payer: 59 | Admitting: Medical

## 2020-01-19 ENCOUNTER — Encounter: Payer: Self-pay | Admitting: Medical

## 2020-01-19 ENCOUNTER — Other Ambulatory Visit: Payer: Self-pay

## 2020-01-19 VITALS — BP 132/76 | HR 77 | Temp 98.4°F | Ht 65.0 in | Wt 231.0 lb

## 2020-01-19 DIAGNOSIS — J4540 Moderate persistent asthma, uncomplicated: Secondary | ICD-10-CM | POA: Diagnosis not present

## 2020-01-19 DIAGNOSIS — K219 Gastro-esophageal reflux disease without esophagitis: Secondary | ICD-10-CM

## 2020-01-19 DIAGNOSIS — J301 Allergic rhinitis due to pollen: Secondary | ICD-10-CM | POA: Diagnosis not present

## 2020-01-19 DIAGNOSIS — R21 Rash and other nonspecific skin eruption: Secondary | ICD-10-CM

## 2020-01-19 DIAGNOSIS — R635 Abnormal weight gain: Secondary | ICD-10-CM | POA: Insufficient documentation

## 2020-01-19 DIAGNOSIS — R062 Wheezing: Secondary | ICD-10-CM | POA: Diagnosis not present

## 2020-01-19 DIAGNOSIS — K130 Diseases of lips: Secondary | ICD-10-CM | POA: Diagnosis not present

## 2020-01-19 DIAGNOSIS — Z8249 Family history of ischemic heart disease and other diseases of the circulatory system: Secondary | ICD-10-CM

## 2020-01-19 DIAGNOSIS — J45909 Unspecified asthma, uncomplicated: Secondary | ICD-10-CM | POA: Insufficient documentation

## 2020-01-19 MED ORDER — CLOTRIMAZOLE-BETAMETHASONE 1-0.05 % EX CREA: 1.0000 "application " | TOPICAL_CREAM | Freq: Two times a day (BID) | CUTANEOUS | 0 refills | Status: DC

## 2020-01-19 MED ORDER — BREO ELLIPTA 100-25 MCG/INH IN AEPB: 1.0000 | INHALATION_SPRAY | Freq: Every day | RESPIRATORY_TRACT | 0 refills | Status: DC

## 2020-01-19 NOTE — Progress Notes (Signed)
Subjective:  Kristy [name] is a 49 y.o. female who presents for Chief Complaint  Patient presents with  . Wheezing    ith congestion for 2 months      Here today for concern of congestion and wheezing.  She has a history of allergies and for the last 2 months she has felt congested in her chest.  She is taking Zyrtec a few days per week and that has not helped.  She is not currently using a nasal spray although most of the congestion is in her chest not the nose.  She has been hearing some faint wheeze ease with her breathing and her boyfriend has commented on the wheezing as well.    She has seen pulmonology in the past for breathing concerns.  No specific diagnosis of asthma.  She does not smoke.  No recent inhalers in the last few years  She does have family history of heart disease in mom brother in father all at young ages.  She has other siblings without heart disease she denies palpitations or edema.  No syncope.  No frank chest pain, just wheezing   No recent trauma fall or injury  No other aggravating or relieving factors.    No other c/o.  The following portions of the patient's history were reviewed and updated as appropriate: allergies, current medications, past family history, past medical history, past social history, past surgical history and problem list.  ROS Otherwise as in subjective above    Objective: BP 132/76   Pulse 77   Temp 98.4 F (36.9 C)   Ht 5\' 5"  (1.651 m)   Wt 231 lb (104.8 kg)   SpO2 99%   BMI 38.44 kg/m   General appearance: alert, no distress, well developed, well nourished HEENT: normocephalic, sclerae anicteric, conjunctiva pink and moist, TMs pearly, nares with mild turbinate edema, no discharge or erythema, pharynx normal Oral cavity: MMM, no lesions Neck: supple, no lymphadenopathy, no thyromegaly, no masses Heart: RRR, normal S1, S2, no murmurs Lungs: CTA bilaterally, no wheezes, rhonchi, or rales Abdomen: +bs, soft, non  tender, non distended, no masses, no hepatomegaly, no splenomegaly Pulses: 2+ radial pulses, 2+ pedal pulses, normal cap refill Ext: no edema Mild irritation rough skin at the corners of the mouth bilaterally    Assessment: Encounter Diagnoses  Name Primary?  . Allergic rhinitis due to pollen, unspecified seasonality Yes  . Wheezing   . Weight gain   . Gastroesophageal reflux disease, unspecified whether esophagitis present   . Moderate persistent reactive airway disease without complication   . Family history of premature CAD   . Rash   . Angular cheilosis      Plan: We discussed her concerns.  Her exam was normal today no obvious wheezing.  We discussed possible causes.  I suspect either reactive airway related to allergies or deconditioning  Of note she has seen pulmonology in the past, reviewed last PFTs in the chart, no specific history of asthma  She has significant family history of heart disease.  She saw cardiology in 2018 and had a stress test which I reviewed.  Low risk study no signs of ischemia  She will use a 2-week trial of Breo as below and continue allergy pill daily to see if this calms down her symptoms  We discussed the importance of regular exercise which she is not doing.  We discussed gradually starting with aerobic exercise and increasing it from 20 minutes/day up to an hour per day  on a regular basis.  We discussed importance of heart disease prevention, healthy diet and exercise  Anger cheilosis -cream below for very limited short-term use sparingly.  We discussed risk and benefits of medication.  She has been prescribed this by dermatology in the past.  Kristy [name] was seen today for wheezing.  Diagnoses and all orders for this visit:  Allergic rhinitis due to pollen, unspecified seasonality  Wheezing  Weight gain  Gastroesophageal reflux disease, unspecified whether esophagitis present  Moderate persistent reactive airway disease without  complication  Family history of premature CAD  Rash  Angular cheilosis  Other orders -     clotrimazole-betamethasone (LOTRISONE) cream; Apply 1 application topically 2 (two) times daily. -     fluticasone furoate-vilanterol (BREO ELLIPTA) 100-25 MCG/INH AEPB; Inhale 1 puff into the lungs daily.    Follow up: 2 wk

## 2020-03-01 ENCOUNTER — Emergency Department (HOSPITAL_COMMUNITY): Admission: EM | Admit: 2020-03-01 | Discharge: 2020-03-01 | Discharge status: Against Medical Advice | Payer: 59

## 2020-03-01 ENCOUNTER — Other Ambulatory Visit: Payer: Self-pay

## 2020-03-01 MED ORDER — ONDANSETRON 4 MG PO TBDP: 4.0000 mg | ORAL_TABLET | Freq: Once | ORAL | Status: DC | PRN

## 2020-03-01 NOTE — ED Notes (Signed)
Pt called and stated that she was going to her PCP tomorrow.

## 2020-03-02 ENCOUNTER — Ambulatory Visit: Payer: 59 | Admitting: Family Medicine

## 2020-03-02 ENCOUNTER — Other Ambulatory Visit: Payer: Self-pay | Admitting: Family Medicine

## 2020-03-02 ENCOUNTER — Ambulatory Visit
Admission: RE | Admit: 2020-03-02 | Discharge: 2020-03-02 | Disposition: A | Discharge status: Home / Self Care | Payer: 59 | Source: Ambulatory Visit | Attending: Family Medicine | Admitting: Family Medicine

## 2020-03-02 ENCOUNTER — Encounter: Payer: Self-pay | Admitting: Family Medicine

## 2020-03-02 VITALS — BP 120/84 | HR 88 | Temp 98.7°F | Ht 65.0 in | Wt 224.2 lb

## 2020-03-02 DIAGNOSIS — R1030 Lower abdominal pain, unspecified: Secondary | ICD-10-CM | POA: Diagnosis not present

## 2020-03-02 DIAGNOSIS — R1032 Left lower quadrant pain: Secondary | ICD-10-CM

## 2020-03-02 LAB — COMPREHENSIVE METABOLIC PANEL
ALT: 11 IU/L (ref 0–32)
AST: 16 IU/L (ref 0–40)
Albumin/Globulin Ratio: 1.5 (ref 1.2–2.2)
Albumin: 4 g/dL (ref 3.8–4.8)
Alkaline Phosphatase: 52 IU/L (ref 48–121)
BUN/Creatinine Ratio: 10 (ref 9–23)
BUN: 8 mg/dL (ref 6–24)
Bilirubin Total: 0.2 mg/dL (ref 0.0–1.2)
CO2: 29 mmol/L (ref 20–29)
Calcium: 9.3 mg/dL (ref 8.7–10.2)
Chloride: 103 mmol/L (ref 96–106)
Creatinine, Ser: 0.81 mg/dL (ref 0.57–1.00)
GFR calc Af Amer: 100 mL/min/{1.73_m2} (ref >59)
GFR calc non Af Amer: 87 mL/min/{1.73_m2} (ref >59)
Globulin, Total: 2.7 g/dL (ref 1.5–4.5)
Glucose: 88 mg/dL (ref 65–99)
Potassium: 4.2 mmol/L (ref 3.5–5.2)
Sodium: 139 mmol/L (ref 134–144)
Total Protein: 6.7 g/dL (ref 6.0–8.5)

## 2020-03-02 LAB — POCT URINALYSIS DIP (PROADVANTAGE DEVICE)
Bilirubin, UA: NEGATIVE
Glucose, UA: NEGATIVE mg/dL
Ketones, POC UA: NEGATIVE mg/dL
Leukocytes, UA: NEGATIVE
Nitrite, UA: NEGATIVE
Protein Ur, POC: NEGATIVE mg/dL
Specific Gravity, Urine: 1.015
Urobilinogen, Ur: NEGATIVE
pH, UA: 6.5 (ref 5.0–8.0)

## 2020-03-02 LAB — CBC WITH DIFFERENTIAL/PLATELET
Basophils Absolute: 0 10*3/uL (ref 0.0–0.2)
Basos: 0 %
EOS (ABSOLUTE): 0.2 10*3/uL (ref 0.0–0.4)
Eos: 3 %
Hematocrit: 38.5 % (ref 34.0–46.6)
Hemoglobin: 12.9 g/dL (ref 11.1–15.9)
Lymphocytes Absolute: 1.9 10*3/uL (ref 0.7–3.1)
Lymphs: 40 %
MCH: 29 pg (ref 26.6–33.0)
MCHC: 33.5 g/dL (ref 31.5–35.7)
MCV: 87 fL (ref 79–97)
Monocytes Absolute: 0.4 10*3/uL (ref 0.1–0.9)
Monocytes: 8 %
Neutrophils Absolute: 2.3 10*3/uL (ref 1.4–7.0)
Neutrophils: 49 %
Platelets: 286 10*3/uL (ref 150–450)
RBC: 4.45 x10E6/uL (ref 3.77–5.28)
RDW: 14.5 % (ref 11.7–15.4)
WBC: 4.8 10*3/uL (ref 3.4–10.8)

## 2020-03-02 LAB — POCT URINE PREGNANCY: Preg Test, Ur: NEGATIVE

## 2020-03-02 MED ORDER — HYOSCYAMINE SULFATE 0.125 MG PO TBDP: 0.1250 mg | ORAL_TABLET | ORAL | 0 refills | Status: DC | PRN

## 2020-03-02 MED ORDER — IBUPROFEN 800 MG PO TABS: 800.0000 mg | ORAL_TABLET | Freq: Three times a day (TID) | ORAL | 0 refills | Status: DC | PRN

## 2020-03-02 MED ORDER — KETOROLAC TROMETHAMINE 60 MG/2ML IM SOLN
60.0000 mg | Freq: Once | INTRAMUSCULAR | Status: AC
  Administered 2020-03-02: 60 mg via INTRAMUSCULAR

## 2020-03-02 NOTE — Patient Instructions (Signed)
We gave you a toradol injection to help with your pain. We are running some labs which should be back later this afternoon. We are going to try and get you a CT scan to further evaluate your abdominal pain.

## 2020-03-02 NOTE — Progress Notes (Signed)
Chief Complaint  Patient presents with  . Abdominal Pain    lower abdominal, stabbing pain. Started 2 days ago. Normal bowel movements. Pain can radiate to her back and no better or worse when she is sitting or standing. She said it feels like a contraction.    Started with abdominal pain 2 days ago. Started suddently, strong.  Pain is at her LLQ, extends to suprapbuc area, shoots across the whole lower stomach, and around to the back and down the left leg.  Heating pad helps some No change with position changes. Feels like she can't pass gas. Not better after bowel movement or burping. Normal appetite, but not eating much x 3 days (afraid to eat, not wanting to cause gas).  She has some constipation--isn't regular, but isn't hard when she goes.  Has BM every 3 days.  Last was yesterday. She took some chamomille tea to help her go, since having pain, thought could be gas.  BM was small amount, not hard No blood or mucus (occasionally sees mucus). No change in pain.  PMH, PSH, SH reviewed  Prior abdominal surgeries:  BTL, c/s, tummy tuck. S/p uterine ablation, known fibroids [place] 03/2019--normal ovaries, multiple fibroids.  H/o GERD, h/o h.pylori in past, allergies and vit D deficiency Had labs through bariatric clinic last week, normal.  Outpatient Encounter Medications as of 03/02/2020  Medication Sig Note  . aspirin-sod bicarb-citric acid (ALKA-SELTZER) 325 MG TBEF tablet Take 325 mg by mouth at bedtime.   . cetirizine (ZYRTEC) 10 MG tablet Take 1 tablet (10 mg total) by mouth daily.   . Simethicone (GAS-X PO) Take 1 capsule by mouth in the morning and at bedtime.   . Cholecalciferol (D3-1000) 25 MCG (1000 UT) tablet Take 2,000 Units by mouth daily. (Patient not taking: Reported on 01/19/2020)   . clotrimazole-betamethasone (LOTRISONE) cream Apply 1 application topically 2 (two) times daily. (Patient not taking: Reported on 03/02/2020)   . fluticasone furoate-vilanterol (BREO ELLIPTA)  100-25 MCG/INH AEPB Inhale 1 puff into the lungs daily. (Patient not taking: Reported on 03/02/2020)   . hydrochlorothiazide (HYDRODIURIL) 12.5 MG tablet Take 1 tablet (12.5 mg total) by mouth daily. (Patient not taking: Reported on 07/28/2019) 03/02/2020: Pt states she has never taken this or gotten it filled  . hyoscyamine (ANASPAZ) 0.125 MG TBDP disintergrating tablet Place 1 tablet (0.125 mg total) under the tongue every 4 (four) hours as needed for cramping.   Marland Kitchen ibuprofen (ADVIL) 800 MG tablet Take 1 tablet (800 mg total) by mouth every 8 (eight) hours as needed for moderate pain. Take with food   . omeprazole (PRILOSEC) 40 MG capsule TAKE 1 CAPSULE BY MOUTH EVERY DAY (Patient not taking: Reported on 01/19/2020) 03/02/2020: Uses prn  . Plecanatide (TRULANCE) 3 MG TABS Take 1 tablet by mouth daily. (Patient not taking: Reported on 07/28/2019) 03/02/2020: Given sample, not taken  . PROAIR HFA 108 (90 Base) MCG/ACT inhaler TAKE 2 PUFFS BY MOUTH EVERY 6 HOURS AS NEEDED FOR WHEEZE OR SHORTNESS OF BREATH (Patient not taking: Reported on 11/05/2018)   . [EXPIRED] ketorolac (TORADOL) injection 60 mg     No facility-administered encounter medications on file as of 03/02/2020.   Patient states she has "never taken HBP med"--HCTZ on chart from 07/2019 06/2019  (NOT taking ibuprofen 861m prior to visit)  Allergies  Allergen Reactions  . Iodinated Diagnostic Agents Itching   ROS: no fever, chills, headache, dizziness, nausea, vomiting, URI symptoms, urinary complaints, vaginal discharge.  Abdominal pain per HPI.  No bleeding, bruising, rash or other concerns.   PHYSICAL EXAM:  BP 120/84   Pulse 88   Temp 98.7 F (37.1 C) (Tympanic)   Ht 5' 5" (1.651 m)   Wt 224 lb 3.2 oz (101.7 kg)   BMI 37.31 kg/m   Patient appears to be in moderate discomfort during visit HEENT: conjunctiva and sclera are clear, EOMI, wearing mask Neck: no lymphadenopathy, thyromegaly or mass Heart: regular rate and  rhythm Lungs: clear bilaterally Back: no spinal tenderness or CVA tenderness Abdomen: decreased bowel sounds. Tender at LLQ with some guarding.  No significant tenderness elsewhere (mild diffuse, mainly focused at [facility]). Extremities: no edema Neuro: alert and oriented, normal strength, gait  Urine SG 1.015 moderate blood, otherwise normal  (blood also noted on urine done 7 months ago).  ASSESSMENT/PLAN:  LLQ abdominal pain - with some guarding and decreased BS, concern for pSBO vs diverticulitis.  Check labs and CT scan - Plan: Comprehensive metabolic panel, CBC with Differential/Platelet, CT Abdomen Pelvis Wo Contrast, POCT urine pregnancy, ketorolac (TORADOL) injection 60 mg, hyoscyamine (ANASPAZ) 0.125 MG TBDP disintergrating tablet, ibuprofen (ADVIL) 800 MG tablet  Lower abdominal pain - Plan: POCT Urinalysis DIP (Proadvantage Device), POCT urine pregnancy, ketorolac (TORADOL) injection 60 mg   Normal CBC and c-met. CT without explanation for acute findings.  Reviewed in detail with pt, also d/w [name]. Will treat with Bentyl prn pain, and 800 mg ibuprofen (declined tramadol due to sedation) Was feeling better after toradol and tylenol.  F/u with [name] next week if any persistent pain.  Discussed need for f/u on pulmonary nodule in a year, and need for routine colon cancer screening with colonoscopy.  To address with PCP.

## 2020-03-02 NOTE — Progress Notes (Signed)
Pt advised of results.  Advised of incidental finding of nodule R lung, poss need for f/u 1 year.  No findings for her pain.  Do not suspect cellulitis/panniculitis based on exam findings, normal CBC.  Her pain eased off an hour after the shot, feeling much better.  Discussed using antispasmodic med if pain recurs. Offered a few ultram--declined, doesn't want sedating medication.  Preferred 800mg  ibuprofen to use prn with food if pain recurs.  Advised to f/u with [name] next week.  Also discussed that she hasn't had screening colonoscopy yet, and is 49, recommended that she do so.

## 2020-03-06 ENCOUNTER — Ambulatory Visit: Payer: 59 | Admitting: Obstetrics and Gynecology

## 2020-04-05 ENCOUNTER — Other Ambulatory Visit: Payer: Self-pay | Admitting: Family Medicine

## 2020-04-05 ENCOUNTER — Ambulatory Visit (INDEPENDENT_AMBULATORY_CARE_PROVIDER_SITE_OTHER): Payer: 59 | Admitting: Obstetrics and Gynecology

## 2020-04-05 ENCOUNTER — Other Ambulatory Visit: Payer: Self-pay

## 2020-04-05 ENCOUNTER — Encounter: Payer: Self-pay | Admitting: Obstetrics and Gynecology

## 2020-04-05 ENCOUNTER — Other Ambulatory Visit (HOSPITAL_COMMUNITY)
Admission: RE | Admit: 2020-04-05 | Discharge: 2020-04-05 | Disposition: A | Discharge status: Home / Self Care | Payer: 59 | Source: Ambulatory Visit | Attending: Obstetrics and Gynecology | Admitting: Obstetrics and Gynecology

## 2020-04-05 VITALS — BP 120/80 | Ht 65.0 in | Wt 232.0 lb

## 2020-04-05 DIAGNOSIS — Z124 Encounter for screening for malignant neoplasm of cervix: Secondary | ICD-10-CM | POA: Diagnosis not present

## 2020-04-05 DIAGNOSIS — Z1151 Encounter for screening for human papillomavirus (HPV): Secondary | ICD-10-CM

## 2020-04-05 DIAGNOSIS — R1032 Left lower quadrant pain: Secondary | ICD-10-CM

## 2020-04-05 DIAGNOSIS — Z1231 Encounter for screening mammogram for malignant neoplasm of breast: Secondary | ICD-10-CM | POA: Diagnosis not present

## 2020-04-05 DIAGNOSIS — Z9189 Other specified personal risk factors, not elsewhere classified: Secondary | ICD-10-CM

## 2020-04-05 DIAGNOSIS — R399 Unspecified symptoms and signs involving the genitourinary system: Secondary | ICD-10-CM | POA: Diagnosis not present

## 2020-04-05 DIAGNOSIS — Z1211 Encounter for screening for malignant neoplasm of colon: Secondary | ICD-10-CM

## 2020-04-05 DIAGNOSIS — R3121 Asymptomatic microscopic hematuria: Secondary | ICD-10-CM

## 2020-04-05 DIAGNOSIS — Z01419 Encounter for gynecological examination (general) (routine) without abnormal findings: Secondary | ICD-10-CM | POA: Diagnosis not present

## 2020-04-05 DIAGNOSIS — Z803 Family history of malignant neoplasm of breast: Secondary | ICD-10-CM

## 2020-04-05 DIAGNOSIS — Z13220 Encounter for screening for lipoid disorders: Secondary | ICD-10-CM

## 2020-04-05 NOTE — Progress Notes (Addendum)
Chief Complaint  Patient presents with  . Gynecologic Exam  . Pelvic Pain    left side only, urinary frequency and burning, x 1 month    HPI:      [name] is a 49 y.o. [id number] who LMP was No LMP recorded. Patient has had an ablation., presents today for her annual examination. Her menses have been absent after novasure ablation. She did have light spotting a few wks ago with mild dysmen, unusual for pt. Has mult small leio on u/s (1.4 cm-2.2 cm).   Sex activity: sex active, contraception - tubal ligation.  Last Pap: April 06, 2015  Results were: no abnormalities /neg HPV DNA  Hx of STDs: none  Pt with sharp LLQ pains intermittently recently; had similar sx last yr with neg GYN u/s except small leio 9/20. Sx increase with urination and having BMs. Has urin frequency with good flow, drinks caffeine. Pain can radiate to LT flank and low back, as well as down front of LT leg. Hurts to walk when sx occur. Saw PCP and had neg abd/pelvic CT scan and UA (except for hematuria which is a chronic problem for pt) 8/21. Taking ibup with sx relief. No urin or vag sx. Has BM Q2-3 days, normal for pt. Soft, formed. Hx of microscopic hematuria in past. Saw PCP and urology with neg eval.   She has a hx of BV and AV in past.  Last mammogram: 05/11/19  Results were: normal--routine follow-up in 12 months.  There is a FH of breast cancer in her mom and MGM. There is no FH of ovarian cancer. The patient does do self-breast exams. Pt was BRCA neg 5/14. Myrisk neg 2019. IBIS=25.8%. Has not had scr breast MRI  Tobacco use: The patient denies current or previous tobacco use. Alcohol AVW:UJWJ No drug use. Exercise: occas active  Colonoscopy: never  She does get adequate calcium but not Vitamin D in her diet.  Borderline lipids 2019 and 2020 and Vit D deficiency 2019. Not taking supp. Due for fasting labs  Past Medical History:  Diagnosis Date  . Allergy   . BRCA negative  11/2012, 4/19   BRCA neg 2014; MyRisk update neg except POLE VUS 4/19  . Dysrhythmia    a. 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 Family history of breast cancer   . Family history of premature CAD   . GERD (gastroesophageal reflux disease)   . H. pylori infection 10/2010  . H/O exercise stress test 2018  . Increased risk of breast cancer 11/2012; 4/19   5/14 IBIS=25.6%; 4/19 IBIS=25.8%  . Iron deficiency anemia, unspecified    history of, normal hemoglobin 06/2019  . Obesity   . Seasonal allergies    ragweed, tree pollen, mild and dust per testing 08/2012  . Unspecified vitamin D deficiency   . Vitamin D deficiency 2017    Past Surgical History:  Procedure Laterality Date  . BREAST BIOPSY Right 1980s   neg  . CESAREAN SECTION    . [place] N/A 09/14/2015   Procedure: DILATATION & CURETTAGE/HYSTEROSCOPY WITH NOVASURE ABLATION;  Surgeon: Gae Dry, MD;  Location: [facility] ORS;  Service: Gynecology;  Laterality: N/A;  . IUD REMOVAL N/A 09/14/2015   Procedure: INTRAUTERINE DEVICE (IUD) REMOVAL;  Surgeon: Gae Dry, MD;  Location: [facility] ORS;  Service: Gynecology;  Laterality: N/A;  . LAPAROSCOPIC BILATERAL SALPINGECTOMY Bilateral 09/14/2015   Procedure: LAPAROSCOPIC BILATERAL SALPINGECTOMY;  Surgeon: Herbie Baltimore  Salli Quarry, MD;  Location: [facility];  Service: Gynecology;  Laterality: Bilateral;  . TUBAL LIGATION Bilateral 09/14/2015   Procedure: BILATERAL TUBAL LIGATION;  Surgeon: Gae Dry, MD;  Location: [facility];  Service: Gynecology;  Laterality: Bilateral;  . VULVAR LESION REMOVAL N/A 09/14/2015   Procedure: VULVAR LESION;  Surgeon: Gae Dry, MD;  Location: [facility];  Service: Gynecology;  Laterality: N/A;  . WISDOM TOOTH EXTRACTION      Family History  Problem Relation Age of Onset  . Heart disease Mother 22       stent, CAD  . Breast cancer Mother 44       with recurrence about 4  . Cancer Father        esophageal  cancer  . Heart disease Father 66       died of MI  . Alcohol abuse Father   . Hypertension Sister   . Hypertension Brother   . CAD Brother        MI at age 32  . Hypertension Brother   . Breast cancer Maternal Grandmother 66  . Diabetes Neg Hx   . Stroke Neg Hx     Social History   Socioeconomic History  . Marital status: Single    Spouse name: Not on file  . Number of children: Not on file  . Years of education: Not on file  . Highest education level: Not on file  Occupational History  . Not on file  Tobacco Use  . Smoking status: Never Smoker  . Smokeless tobacco: Never Used  Vaping Use  . Vaping Use: Never used  Substance and Sexual Activity  . Alcohol use: No    Comment: maybe one drink a year  . Drug use: No  . Sexual activity: Yes    Birth control/protection: Surgical    Comment: reports ablation and partner had vasectomy.   Other Topics Concern  . Not on file  Social History Narrative   In relationship x 6 mo, divorced, 1 child age 73 yo, exercise with classes, running, works as a Radiographer, therapeutic Strain:   . Difficulty of Paying Living Expenses: Not on file  Food Insecurity:   . Worried About Charity fundraiser in the Last Year: Not on file  . Ran Out of Food in the Last Year: Not on file  Transportation Needs:   . Lack of Transportation (Medical): Not on file  . Lack of Transportation (Non-Medical): Not on file  Physical Activity:   . Days of Exercise per Week: Not on file  . Minutes of Exercise per Session: Not on file  Stress:   . Feeling of Stress : Not on file  Social Connections:   . Frequency of Communication with Friends and Family: Not on file  . Frequency of Social Gatherings with Friends and Family: Not on file  . Attends Religious Services: Not on file  . Active Member of Clubs or Organizations: Not on file  . Attends Archivist Meetings: Not on file  . Marital Status: Not on  file  Intimate Partner Violence:   . Fear of Current or Ex-Partner: Not on file  . Emotionally Abused: Not on file  . Physically Abused: Not on file  . Sexually Abused: Not on file    Current Outpatient Medications on File Prior to Visit  Medication Sig Dispense Refill  . cetirizine (ZYRTEC) 10 MG tablet Take 1 tablet (  10 mg total) by mouth daily. 90 tablet 3  . clotrimazole-betamethasone (LOTRISONE) cream Apply 1 application topically 2 (two) times daily. 30 g 0  . fluticasone furoate-vilanterol (BREO ELLIPTA) 100-25 MCG/INH AEPB Inhale 1 puff into the lungs daily. 14 each 0  . ibuprofen (ADVIL) 800 MG tablet Take 1 tablet (800 mg total) by mouth every 8 (eight) hours as needed for moderate pain. Take with food (Patient taking differently: Take 800 mg by mouth every 8 (eight) hours as needed for moderate pain. Take with food) 30 tablet 0  . omeprazole (PRILOSEC) 40 MG capsule TAKE 1 CAPSULE BY MOUTH EVERY DAY 90 capsule 3   No current facility-administered medications on file prior to visit.     ROS:  Review of Systems  Constitutional: Negative for fatigue, fever and unexpected weight change.  Respiratory: Negative for cough, shortness of breath and wheezing.   Cardiovascular: Negative for chest pain, palpitations and leg swelling.  Gastrointestinal: Negative for blood in stool, constipation, diarrhea, nausea and vomiting.  Endocrine: Negative for cold intolerance, heat intolerance and polyuria.  Genitourinary: Positive for pelvic pain. Negative for dyspareunia, dysuria, flank pain, frequency, genital sores, hematuria, menstrual problem, urgency, vaginal bleeding, vaginal discharge and vaginal pain.  Musculoskeletal: Negative for back pain, joint swelling and myalgias.  Skin: Negative for rash.  Neurological: Negative for dizziness, syncope, light-headedness, numbness and headaches.  Hematological: Negative for adenopathy.  Psychiatric/Behavioral: Negative for agitation, confusion,  sleep disturbance and suicidal ideas. The patient is not nervous/anxious.   BREAST: lump and tenderness   Objective: BP 120/80   Ht 5' 5"  (1.651 m)   Wt 232 lb (105.2 kg)   BMI 38.61 kg/m    Physical Exam Constitutional:      Appearance: She is well-developed.  Genitourinary:     Vulva, vagina, cervix, uterus, right adnexa and left adnexa normal.     No vulval lesion or tenderness noted.     No vaginal discharge, erythema or tenderness.     No cervical polyp.     Uterus is not enlarged or tender.     No right or left adnexal mass present.     Right adnexa not tender.     Left adnexa not tender.  Neck:     Thyroid: No thyromegaly.  Cardiovascular:     Rate and Rhythm: Normal rate and regular rhythm.     Heart sounds: Normal heart sounds. No murmur heard.   Pulmonary:     Effort: Pulmonary effort is normal.     Breath sounds: Normal breath sounds.  Chest:     Breasts:        Right: No mass, nipple discharge, skin change or tenderness.        Left: No mass, nipple discharge, skin change or tenderness.  Abdominal:     Palpations: Abdomen is soft.     Tenderness: There is no abdominal tenderness. There is no guarding.  Musculoskeletal:        General: Normal range of motion.     Cervical back: Normal range of motion.  Neurological:     General: No focal deficit present.     Mental Status: She is alert and oriented to person, place, and time.     Cranial Nerves: No cranial nerve deficit.  Skin:    General: Skin is warm and dry.  Psychiatric:        Mood and Affect: Mood normal.        Behavior: Behavior normal.  Thought Content: Thought content normal.        Judgment: Judgment normal.  Vitals reviewed.    RESULTS:  Results for orders placed or performed in visit on 04/05/20 (from the past 24 hour(s))  POCT Urinalysis Dipstick     Status: Normal   Collection Time: 04/06/20  9:59 AM  Result Value Ref Range   Color, UA yellow    Clarity, UA clear     Glucose, UA Negative Negative   Bilirubin, UA neg    Ketones, UA neg    Spec Grav, UA 1.010 1.010 - 1.025   Blood, UA trace    pH, UA 6.0 5.0 - 8.0   Protein, UA Negative Negative   Urobilinogen, UA     Nitrite, UA neg    Leukocytes, UA Negative Negative   Appearance     Odor      Assessment/Plan: Encounter for annual routine gynecological examination  Cervical cancer screening - Plan: Cytology - PAP  Screening for HPV (human papillomavirus) - Plan: Cytology - PAP  Encounter for screening mammogram for malignant neoplasm of breast - Plan: MM 3D SCREEN BREAST BILATERAL; pt to sched mammo  Family history of breast cancer--MyRisk neg.  Increased risk of breast cancer--pt aware of monthly SBE, yearly CBE and mammos, and yearly scr breast MRI. Add Vit D supp. F/u if wants to sched MRI  Screening for colon cancer - Plan: Ambulatory referral to Gastroenterology; refer to GI for scr colonoscopy due to age and LLQ pain  Screening cholesterol level - Plan: Lipid panel  UTI symptoms - Plan: Urine Culture, POCT Urinalysis Dipstick; Neg UA except trace hematuria. Check C&S. Will f/u if pos.  LLQ pain--rule out UTI. Neg exam, neg CT and GYN u/s. Most likely MSK. Try low back/pelvic stretches/exercises. D/C caffeine/soda and increase water.   Asymptomatic microscopic hematuria--had neg urology eval in past       GYN counsel breast self exam, mammography screening, adequate intake of calcium and vitamin D, diet and exercise     F/U  Return in about 1 year (around 04/05/2021). /1 yr annual  [place] B. Martine Bleecker, PA-C 04/06/2020 10:03 AM

## 2020-04-05 NOTE — Addendum Note (Signed)
Addended by: Ardeth Perfect B on: 09/28/7423 02:20 PM   Modules accepted: Orders

## 2020-04-05 NOTE — Patient Instructions (Signed)
I value your feedback and entrusting us with your care. If you get a [facility] patient survey, I would appreciate you taking the time to let us know about your experience today. Thank you! ° °As of June 24, 2019, your lab results will be released to your MyChart immediately, before I even have a chance to see them. Please give me time to review them and contact you if there are any abnormalities. Thank you for your patience.  ° °[facility]: [phone] ° ° ° °

## 2020-04-06 LAB — POCT URINALYSIS DIPSTICK
Bilirubin, UA: NEGATIVE
Glucose, UA: NEGATIVE
Ketones, UA: NEGATIVE
Leukocytes, UA: NEGATIVE
Nitrite, UA: NEGATIVE
Protein, UA: NEGATIVE
Spec Grav, UA: 1.01 (ref 1.010–1.025)
pH, UA: 6 (ref 5.0–8.0)

## 2020-04-06 NOTE — Addendum Note (Signed)
Addended by: Ardeth Perfect B on: 9/44/4619 10:04 AM   Modules accepted: Orders

## 2020-04-07 LAB — URINE CULTURE

## 2020-04-07 NOTE — Progress Notes (Signed)
Pls let pt know C&S neg. D/C caffeine and increase water to see if sx improve. Thx

## 2020-04-10 LAB — CYTOLOGY - PAP
Comment: NEGATIVE
Diagnosis: UNDETERMINED — AB
High risk HPV: POSITIVE — AB

## 2020-04-10 NOTE — Progress Notes (Signed)
Pt aware.

## 2020-04-13 ENCOUNTER — Encounter: Payer: Self-pay | Admitting: General Practice

## 2020-04-25 ENCOUNTER — Ambulatory Visit (INDEPENDENT_AMBULATORY_CARE_PROVIDER_SITE_OTHER): Payer: 59 | Admitting: Obstetrics & Gynecology

## 2020-04-25 ENCOUNTER — Other Ambulatory Visit: Payer: Self-pay

## 2020-04-25 ENCOUNTER — Other Ambulatory Visit (HOSPITAL_COMMUNITY)
Admission: RE | Admit: 2020-04-25 | Discharge: 2020-04-25 | Disposition: A | Discharge status: Home / Self Care | Payer: 59 | Source: Ambulatory Visit | Attending: Obstetrics & Gynecology | Admitting: Obstetrics & Gynecology

## 2020-04-25 ENCOUNTER — Encounter: Payer: Self-pay | Admitting: Obstetrics & Gynecology

## 2020-04-25 VITALS — BP 138/98 | Ht 65.0 in | Wt 228.0 lb

## 2020-04-25 DIAGNOSIS — R87610 Atypical squamous cells of undetermined significance on cytologic smear of cervix (ASC-US): Secondary | ICD-10-CM

## 2020-04-25 DIAGNOSIS — R87810 Cervical high risk human papillomavirus (HPV) DNA test positive: Secondary | ICD-10-CM | POA: Diagnosis present

## 2020-04-25 NOTE — Patient Instructions (Signed)

## 2020-04-25 NOTE — Progress Notes (Signed)
Referring Provider:  [facility]  HPI:  Kristy [name] is a 49 y.o.  [id number]  who presents today for evaluation and management of abnormal cervical cytology.    Dysplasia History:  ASCUS w POS HPV recently  ROS:  Pertinent items are noted in HPI.  OB History  Gravida Para Term Preterm AB Living  _0 SAB TAB Ectopic Multiple Live Births          1    # Outcome Date GA Lbr Len/2nd Weight Sex Delivery Anes PTL Lv  3 AB 10/13/08          2 AB 04/14/00          1 Term 05/12/96   8 lb 6 oz (3.799 kg) F CS-LTranv  N LIV    Past Medical History:  Diagnosis Date  . Allergy   . BRCA negative 11/2012, 4/19   BRCA neg 2014; MyRisk update neg except POLE VUS 4/19  . Dysrhythmia    a. 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 Family history of breast cancer   . Family history of premature CAD   . GERD (gastroesophageal reflux disease)   . H. pylori infection 10/2010  . H/O exercise stress test 2018  . Increased risk of breast cancer 11/2012; 4/19   5/14 IBIS=25.6%; 4/19 IBIS=25.8%  . Iron deficiency anemia, unspecified    history of, normal hemoglobin 06/2019  . Obesity   . Seasonal allergies    ragweed, tree pollen, mild and dust per testing 08/2012  . Unspecified vitamin D deficiency   . Vitamin D deficiency 2017    Past Surgical History:  Procedure Laterality Date  . BREAST BIOPSY Right 1980s   neg  . CESAREAN SECTION    . [place] N/A 09/14/2015   Procedure: DILATATION & CURETTAGE/HYSTEROSCOPY WITH NOVASURE ABLATION;  Surgeon: Gae Dry, MD;  Location: [facility] OR[facility];  Service: Gynecology;  Laterality: N/A;  . IUD REMOVAL N/A 09/14/2015   Procedure: INTRAUTERINE DEVICE (IUD) REMOVAL;  Surgeon: Gae Dry, MD;  Location: [facility];  Service: Gynecology;  Laterality: N/A;  . LAPAROSCOPIC BILATERAL SALPINGECTOMY Bilateral 09/14/2015   Procedure: LAPAROSCOPIC BILATERAL SALPINGECTOMY;  Surgeon: Gae Dry, MD;  Location:  [facility];  Service: Gynecology;  Laterality: Bilateral;  . TUBAL LIGATION Bilateral 09/14/2015   Procedure: BILATERAL TUBAL LIGATION;  Surgeon: Gae Dry, MD;  Location: [facility];  Service: Gynecology;  Laterality: Bilateral;  . VULVAR LESION REMOVAL N/A 09/14/2015   Procedure: VULVAR LESION;  Surgeon: Gae Dry, MD;  Location: [facility];  Service: Gynecology;  Laterality: N/A;  . WISDOM TOOTH EXTRACTION      SOCIAL HISTORY: Social History   Substance and Sexual Activity  Alcohol Use No   Comment: maybe one drink a year   Social History   Substance and Sexual Activity  Drug Use No     Family History  Problem Relation Age of Onset  . Heart disease Mother 77       stent, CAD  . Breast cancer Mother 55       with recurrence about 20  . Cancer Father        esophageal cancer  . Heart disease Father 89       died of MI  . Alcohol abuse Father   . Hypertension Sister   . Hypertension Brother   . CAD Brother        MI at age  27  . Hypertension Brother   . Breast cancer Maternal Grandmother 61  . Diabetes Neg Hx   . Stroke Neg Hx     ALLERGIES:  Iodinated diagnostic agents  Current Outpatient Medications on File Prior to Visit  Medication Sig Dispense Refill  . cetirizine (ZYRTEC) 10 MG tablet Take 1 tablet (10 mg total) by mouth daily. 90 tablet 3  . clotrimazole-betamethasone (LOTRISONE) cream Apply 1 application topically 2 (two) times daily. 30 g 0  . fluticasone furoate-vilanterol (BREO ELLIPTA) 100-25 MCG/INH AEPB Inhale 1 puff into the lungs daily. 14 each 0  . ibuprofen (ADVIL) 800 MG tablet Take 1 tablet (800 mg total) by mouth every 8 (eight) hours as needed for moderate pain. Take with food (Patient taking differently: Take 800 mg by mouth every 8 (eight) hours as needed for moderate pain. Take with food) 30 tablet 0  . omeprazole (PRILOSEC) 40 MG capsule TAKE 1 CAPSULE BY MOUTH EVERY DAY 90 capsule 3   No current facility-administered medications on  file prior to visit.    Physical Exam: -Vitals:  BP (!) 138/98   Ht _0  (1.651 m)   Wt 228 lb (103.4 kg)   BMI 37.94 kg/m  GEN: WD, WN, NAD.  A+ O x 3, good mood and affect. ABD:  NT, ND.  Soft, no masses.  No hernias noted.   Pelvic:   Vulva: Normal appearance.  No lesions.  Vagina: No lesions or abnormalities noted.  Support: Normal pelvic support.  Urethra No masses tenderness or scarring.  Meatus Normal size without lesions or prolapse.  Cervix: See below.  Anus: Normal exam.  No lesions.  Perineum: Normal exam.  No lesions.        Bimanual   Uterus: Normal size.  Non-tender.  Mobile.  AV.  Adnexae: No masses.  Non-tender to palpation.  Cul-de-sac: Negative for abnormality.   PROCEDURE: 1.  Urine Pregnancy Test:  not done 2.  Colposcopy performed with 4% acetic acid after verbal consent obtained                                         -Aceto-white Lesions Location(s): none, narrow os.              -Biopsy performed at 6, 12 o'clock               -ECC indicated and performed: Yes.       -Biopsy sites made hemostatic with pressure, AgNO3, and/or Monsel's solution   -Satisfactory colposcopy: Yes.      -Evidence of Invasive cervical CA :  NO  ASSESSMENT:  Kristy [name] is a 49 y.o. [id number] here for  1. ASCUS with positive high risk HPV cervical   .  PLAN: 1.  I discussed the grading system of pap smears and HPV high risk viral types.  We will discuss and base management after colpo results return. 2. Follow up PAP 6 months, vs intervention if high grade dysplasia identified 3. Treatment of persistantly abnormal PAP smears and cervical dysplasia, even mild, is discussed w pt today in detail, as well as the pros and cons of Cryo and LEEP procedures. Will consider and discuss after results.      Barnett Applebaum, MD, [facility], [facility] 04/25/2020  3:22 PM

## 2020-04-27 LAB — SURGICAL PATHOLOGY

## 2020-05-16 ENCOUNTER — Ambulatory Visit
Admission: RE | Admit: 2020-05-16 | Discharge: 2020-05-16 | Disposition: A | Discharge status: Home / Self Care | Payer: 59 | Source: Ambulatory Visit | Attending: Obstetrics and Gynecology | Admitting: Obstetrics and Gynecology

## 2020-05-16 ENCOUNTER — Other Ambulatory Visit: Payer: Self-pay

## 2020-05-16 DIAGNOSIS — Z1231 Encounter for screening mammogram for malignant neoplasm of breast: Secondary | ICD-10-CM | POA: Diagnosis not present

## 2020-05-18 ENCOUNTER — Encounter: Payer: Self-pay | Admitting: Obstetrics and Gynecology

## 2020-05-30 ENCOUNTER — Encounter: Payer: Self-pay | Admitting: Gastroenterology

## 2020-06-28 ENCOUNTER — Encounter: Payer: Self-pay | Admitting: Medical

## 2020-06-28 ENCOUNTER — Other Ambulatory Visit: Payer: Self-pay

## 2020-06-28 ENCOUNTER — Ambulatory Visit: Payer: 59 | Admitting: Medical

## 2020-06-28 VITALS — BP 120/70 | HR 76 | Ht 65.0 in | Wt 229.8 lb

## 2020-06-28 DIAGNOSIS — R0789 Other chest pain: Secondary | ICD-10-CM | POA: Diagnosis not present

## 2020-06-28 MED ORDER — ARNUITY ELLIPTA 50 MCG/ACT IN AEPB: 1.0000 | INHALATION_SPRAY | Freq: Every day | RESPIRATORY_TRACT | 0 refills | Status: DC

## 2020-06-28 NOTE — Progress Notes (Signed)
Subjective:  Kristy [name] is a 49 y.o. female who presents for Chief Complaint  Patient presents with  . Chest Pain    Mid chest pain when enhaling radiating to right hand x1 day      Here for chest discomfort.  She notes ongoing congestion for months which is not new to her.  She takes medicine for congestion in general.  She also has chronic GERD issues and is good about her treatment medication for this.  In the last few days she has felt discomfort with inhaling, pain radiates from the chest to the right arm.  Has felt a little wheezy.  Has felt a little rattle in her chest.  However no fever, no cough, no sore throat, no body aches or chills, does not feel sick in general  She has an old Breo sample but is not using it.  She gives the rare occasional palpitation but no other chest pain no swelling in the legs  Otherwise normal state of health  No other aggravating or relieving factors.    No other c/o.  The following portions of the patient's history were reviewed and updated as appropriate: allergies, current medications, past family history, past medical history, past social history, past surgical history and problem list.  ROS Otherwise as in subjective above  Objective: BP 120/70   Pulse 76   Ht 5\' 5"  (1.651 m)   Wt 229 lb 12.8 oz (104.2 kg)   SpO2 98%   BMI 38.24 kg/m   General appearance: alert, no distress, well developed, well nourished Neck: supple, no lymphadenopathy, no thyromegaly, no masses, no JVD Heart: RRR, normal S1, S2, no murmurs Lungs: CTA bilaterally, but somewhat decreased lower fields, otherwise no wheezes, rhonchi, or rales Pulses: 2+ radial pulses, 2+ pedal pulses, normal cap refill Ext: no edema   Assessment: Encounter Diagnosis  Name Primary?  . Discomfort of chest wall Yes     Plan: We discussed her symptoms.  I suspect the cold weather is causing some reactive airway.  We have seen her several times in recent years for similar  chest discomfort, chest pain or dyspnea complaints.   I reviewed her August 2018 exercise stress test which showed no evidence of ischemia.  I reviewed her chest x-ray from January 2021 which was normal.    Her CT abdomen pelvis from August 2021 which showed 0.5 cm right lower lobe nodule.  She is low risk so they recommended considering 71-month follow-up if needed.  I reviewed her comprehensive metabolic lab and CBC from August that was unremarkable  She will begin sample of Arnuity inhaler, will be mindful of whether symptoms improve or not particularly how this relates to cold weather.  She already takes medicine for GERD and avoids trigger foods    If new palpitations we can refer for Holter monitoring or cardiology consult.  If no improvement in the near future we can consider chest x-ray or other evaluation.  PFT reviewed today which was normal  She voiced understanding and agreement plan  [name] was seen today for chest pain.  Diagnoses and all orders for this visit:  Discomfort of chest wall -     Spirometry with graph  Other orders -     Fluticasone Furoate (ARNUITY ELLIPTA) 50 MCG/ACT AEPB; Inhale 1 puff into the lungs daily.    Follow up: 1-2 weeks

## 2020-07-15 DIAGNOSIS — D126 Benign neoplasm of colon, unspecified: Secondary | ICD-10-CM

## 2020-07-15 HISTORY — DX: Benign neoplasm of colon, unspecified: D12.6

## 2020-07-18 ENCOUNTER — Other Ambulatory Visit: Payer: Self-pay

## 2020-07-18 ENCOUNTER — Ambulatory Visit (AMBULATORY_SURGERY_CENTER): Payer: Self-pay | Admitting: *Deleted

## 2020-07-18 VITALS — Ht 65.0 in | Wt 223.0 lb

## 2020-07-18 DIAGNOSIS — Z1211 Encounter for screening for malignant neoplasm of colon: Secondary | ICD-10-CM

## 2020-07-18 MED ORDER — PLENVU 140 G PO SOLR: 1.0000 | Freq: Once | ORAL | 0 refills | Status: AC

## 2020-07-18 NOTE — Progress Notes (Signed)

## 2020-08-01 ENCOUNTER — Encounter: Payer: 59 | Admitting: Gastroenterology

## 2020-08-02 ENCOUNTER — Other Ambulatory Visit: Payer: Self-pay

## 2020-08-02 ENCOUNTER — Encounter: Payer: Self-pay | Admitting: Internal Medicine

## 2020-08-02 ENCOUNTER — Ambulatory Visit (INDEPENDENT_AMBULATORY_CARE_PROVIDER_SITE_OTHER): Payer: 59 | Admitting: Internal Medicine

## 2020-08-02 DIAGNOSIS — R918 Other nonspecific abnormal finding of lung field: Secondary | ICD-10-CM

## 2020-08-02 DIAGNOSIS — R0600 Dyspnea, unspecified: Secondary | ICD-10-CM | POA: Diagnosis not present

## 2020-08-02 DIAGNOSIS — R058 Other specified cough: Secondary | ICD-10-CM

## 2020-08-02 DIAGNOSIS — R059 Cough, unspecified: Secondary | ICD-10-CM

## 2020-08-02 DIAGNOSIS — R911 Solitary pulmonary nodule: Secondary | ICD-10-CM | POA: Diagnosis not present

## 2020-08-02 DIAGNOSIS — R0609 Other forms of dyspnea: Secondary | ICD-10-CM

## 2020-08-02 LAB — CBC WITH DIFFERENTIAL/PLATELET
Basophils Absolute: 0 10*3/uL (ref 0.0–0.1)
Basophils Relative: 0.5 % (ref 0.0–3.0)
Eosinophils Absolute: 0.1 10*3/uL (ref 0.0–0.7)
Eosinophils Relative: 2.8 % (ref 0.0–5.0)
HCT: 37.6 % (ref 36.0–46.0)
Hemoglobin: 12.7 g/dL (ref 12.0–15.0)
Lymphocytes Relative: 43.9 % (ref 12.0–46.0)
Lymphs Abs: 2.1 10*3/uL (ref 0.7–4.0)
MCHC: 33.8 g/dL (ref 30.0–36.0)
MCV: 85.7 fl (ref 78.0–100.0)
Monocytes Absolute: 0.3 10*3/uL (ref 0.1–1.0)
Monocytes Relative: 6.7 % (ref 3.0–12.0)
Neutro Abs: 2.2 10*3/uL (ref 1.4–7.7)
Neutrophils Relative %: 46.1 % (ref 43.0–77.0)
Platelets: 227 10*3/uL (ref 150.0–400.0)
RBC: 4.39 Mil/uL (ref 3.87–5.11)
RDW: 14.3 % (ref 11.5–15.5)
WBC: 4.8 10*3/uL (ref 4.0–10.5)

## 2020-08-02 MED ORDER — MONTELUKAST SODIUM 10 MG PO TABS: 10.0000 mg | ORAL_TABLET | Freq: Every day | ORAL | 11 refills | Status: DC

## 2020-08-02 MED ORDER — FAMOTIDINE 20 MG PO TABS: ORAL_TABLET | ORAL | 11 refills | Status: DC

## 2020-08-02 MED ORDER — PREDNISONE 10 MG PO TABS: ORAL_TABLET | ORAL | 0 refills | Status: DC

## 2020-08-02 NOTE — Assessment & Plan Note (Addendum)
See Abdf CT 03/02/20  0.5 cm RLL not present on CTa 09/03/2018 -  Pos fm hx lung ca / never smoker > rec 6 month f/u CT due  End of this month anyway   Discussed in detail all the  indications, usual  risks and alternatives  relative to the benefits with patient who agrees to proceed with w/u as outlined.             Each maintenance medication was reviewed in detail including emphasizing most importantly the difference between maintenance and prns and under what circumstances the prns are to be triggered using an action plan format where appropriate.  Total time for H and P, chart review, counseling, and generating customized AVS unique to this office visit / same day charting = 52 min

## 2020-08-02 NOTE — Assessment & Plan Note (Addendum)
Onset around 2000 - [facility] eval 2016 did not rec shots for dust/ ragweed allergy identified  -Trial of max gerd rx/ singulair 09/04/2016 >> improved 10/02/2016 > changed to prn gerd rx and ok until 04/2021 with sob / "congestion"  - placed back on daily singulair, ppi ac in and pepcid 20 in pm and d/c all inhalers  - Allergy profile 08/02/2020 >  Eos 0.1 /  IgE  Pending   DDX of  difficult airways management almost all start with A and  include Adherence, Ace Inhibitors, Acid Reflux, Active Sinus Disease, Alpha 1 Antitripsin deficiency, Anxiety masquerading as Airways dz,  ABPA,  Allergy(esp in young), Aspiration (esp in elderly), Adverse effects of meds,  Active smoking or vaping, A bunch of PE's (a small clot burden can't cause this syndrome unless there is already severe underlying pulm or vascular dz with poor reserve) plus two Bs  = Bronchiectasis and Beta blocker use..and one C= CHF  Adherence is always the initial "prime suspect" and is a multilayered concern that requires a "trust but verify" approach in every patient - starting with knowing how to use medications, especially inhalers, correctly, keeping up with refills and understanding the fundamental difference between maintenance and prns vs those medications only taken for a very short course and then stopped and not refilled.  - rec return  with all meds in hand using a trust but verify approach to confirm accurate Medication  Reconciliation The principal here is that until we are certain that the  patients are doing what we've asked, it makes no sense to ask them to do more.  ? Acid (or non-acid) GERD > always difficult to exclude as up to 75% of pts in some series report no assoc GI/ Heartburn symptoms> rec max (24h)  acid suppression and diet restrictions/ reviewed and instructions given in writing.    ? Allergy /asthma > check eos/ IgE and  restart singulair/ Prednisone 10 mg take  4 each am x 2 days,   2 each am x 2 days,  1 each am x 2  days and stop   ? Adverse effects of dpi causing sense of "congestion"  > try off, resume if breathing worse  ? Sinus dz > check sinus CT, eos, IgE   ? A bunch of PE's >  D dimer nl - while a normal  Value   may miss small peripheral pe, the clot burden with sob is moderately high and the d dimer  has a very high neg pred value if used in this setting.  .    >>> f/u in 4 weeks

## 2020-08-02 NOTE — Assessment & Plan Note (Signed)
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

## 2020-08-02 NOTE — Progress Notes (Signed)
Subjective:     Patient ID: Kristy [name], female   DOB: 09-12-1970    MRN: [id number]    Brief patient profile:  72 yobf never smoker works as Interior and spatial designer  with sinus issues around ? 2000 esp in fall better while she could get [facility] then 2016 eval by allergy  ? [facility] office pos dust/ ragweed didn't rec any shots and no f/u then variable sob assoc sense of congestion referred to pulmonary clinic 09/04/2016 by Boone Master.   History of Present Illness  09/04/2016 1st [facility] office visit/ Trejan Buda   Chief Complaint  Patient presents with  . Pulmonary Consult    Referred by Dr. Aleen Campi.  Pt c/o SOB for the past several months. She states also having some chest congestion. She has had cough x 2 wks- non prod and sometimes to the point of vomitting. Sometimes cough wakes her up at night.  She states she gets completely out of breath just walking one flight of stairs.   indolent onset 12/2015 of chest "congestion" and need to clear throat  present every day since onset onset   and unable to consistently lie flat due to choking sensation assoc hoarseness and dysphagia/  but s excess/ purulent sputum or mucus plugs. Cough so bad vomits / worse with albuterol  Better since started prilosec but not taking consistently Last pred x 6 days not recently ? Helped some  rec Start singulair 10 mg every evening  Change Omeprazole 40 mg   Take  30-60 min before first meal of the day and Pepcid (famotidine)  20 mg one @  bedtime until return to office - this is the best way to tell whether stomach acid is contributing to your problem.  GERD  Diet    10/02/2016  f/u ov/Ramondo Dietze re: cough since 12/2015 finally better c/w UACS  Chief Complaint  Patient presents with  . Follow-up    cough is much better  maint on ppi/h2hs and singulair with min daytime > noct cough and no sob rec Stay on the same medication x 2 months then try off prilosec but take pepcid 20 mg after bfast and supper an if your  symptoms flare off prilosec then you need to see a GI and I will be happy to refer GERD diet   Please schedule a follow up visit in 3 months but call sooner if needed > did not return    08/02/2020  Re-establish ov/Fransisca Shawn re:  Sob > cough >>  was taking prn  erd rx but 3 m prior to OV  Resumed daily prilosec at bedtime and sense of nasal/chest congestion some better on arnuity but sense of  Congestion persists  Chief Complaint  Patient presents with  . Shortness of Breath    Intermittent dyspnea improved with inhaler prescribed by PCP recently  Dyspnea: steps are a problem, long walks at fast pace also cause sob some better on arnuity =breo same response Cough: lots of nasal congestion/ green mucus in ams x years  Sleeping: does fine p zyrtec hs flat bed  SABA use: none  02: no   No obvious day to day or daytime variability or assoc   mucus plugs or hemoptysis or cp or chest tightness, subjective wheeze or overt  hb symptoms.   Sleeping  without nocturnal   exacerbation  of respiratory  c/o's or need for noct saba. Also denies any obvious fluctuation of symptoms with weather or environmental changes or other aggravating or alleviating factors  except as outlined above   No unusual exposure hx or h/o childhood pna/ asthma or knowledge of premature birth.  Current Allergies, Complete Past Medical History, Past Surgical History, Family History, and Social History were reviewed in [employer] record.  ROS  The following are not active complaints unless bolded Hoarseness, sore throat, dysphagia, dental problems, itching, sneezing,  nasal congestion or discharge of excess mucus or purulent secretions, ear ache,   fever, chills, sweats, unintended wt loss or wt gain, classically pleuritic or exertional cp,  orthopnea pnd or arm/hand swelling  or leg swelling, presyncope, palpitations, abdominal pain, anorexia, nausea, vomiting, diarrhea  or change in bowel habits or change in  bladder habits, change in stools or change in urine, dysuria, hematuria,  rash, arthralgias, visual complaints, headache, numbness, weakness or ataxia or problems with walking or coordination,  change in mood or  memory.        Current Meds  Medication Sig  . cetirizine (ZYRTEC) 10 MG tablet Take 1 tablet (10 mg total) by mouth daily.  . clotrimazole-betamethasone (LOTRISONE) cream Apply 1 application topically 2 (two) times daily.  . Fluticasone Furoate (ARNUITY ELLIPTA) 50 MCG/ACT AEPB Inhale 1 puff into the lungs daily.  . fluticasone furoate-vilanterol (BREO ELLIPTA) 100-25 MCG/INH AEPB Inhale 1 puff into the lungs daily.  Marland Kitchen ibuprofen (ADVIL) 800 MG tablet Take 1 tablet (800 mg total) by mouth every 8 (eight) hours as needed for moderate pain. Take with food  . omeprazole (PRILOSEC) 40 MG capsule TAKE 1 CAPSULE BY MOUTH EVERY DAY              Objective:   Physical Exam      08/02/2020       217  10/02/2016      213   09/04/16 209 lb (94.8 kg)  08/27/16 216 lb (98 kg)  08/20/16 214 lb 9.6 oz (97.3 kg)      Vital signs reviewed  08/02/2020  - Note at rest 02 sats  100% on RA   General appearance:    Mod obese pleasant amb bf ad   HEENT : pt wearing mask not removed for exam due to covid -19 concerns.    NECK :  without JVD/Nodes/TM/ nl carotid upstrokes bilaterally   LUNGS: no acc muscle use,  Nl contour chest which is clear to A and P bilaterally without cough on insp or exp maneuvers   CV:  RRR  no s3 or murmur or increase in P2, and no edema   ABD: obese  soft and nontender with nl inspiratory excursion in the supine position. No bruits or organomegaly appreciated, bowel sounds nl  MS:  Nl gait/ ext warm without deformities, calf tenderness, cyanosis or clubbing No obvious joint restrictions   SKIN: warm and dry without lesions    NEURO:  alert, approp, nl sensorium with  no motor or cerebellar deficits apparent.    Labs ordered/ reviewed:       Lab Results   Component Value Date   WBC 4.8 08/02/2020   HGB 12.7 08/02/2020   HCT 37.6 08/02/2020   MCV 85.7 08/02/2020   PLT 227.0 08/02/2020       EOS  0.1                                    08/02/2020   Lab Results  Component Value Date   DDIMER 0.22 08/02/2020        Studied ordered 08/02/2020   = ct chest/sinus    Labs ordered 08/02/2020  :  allergy profile           Assessment:

## 2020-08-02 NOTE — Patient Instructions (Signed)
Singulair 10 mg every evening with Pepcid 20 mg - both after supper.  Prilosec 40 mg Take 30-60 min before first meal of the day   Stop inhalers for now   Prednisone 10 mg take  4 each am x 2 days,   2 each am x 2 days,  1 each am x 2 days and stop   Please remember to go to the lab department   for your tests - we will call you with the results when they are available.      We will set you up for a CT chest and sinus and I will call you with results   Please schedule a follow up office visit in 4 weeks, sooner if needed  with all medications /inhalers/ solutions in hand so we can verify exactly what you are taking. This includes all medications from all doctors and over the counters

## 2020-08-03 LAB — IGE: IgE (Immunoglobulin E), Serum: 21 kU/L (ref <=114)

## 2020-08-03 LAB — D-DIMER, QUANTITATIVE: D-Dimer, Quant: 0.22 mcg/mL FEU (ref <0.50)

## 2020-08-07 ENCOUNTER — Ambulatory Visit (AMBULATORY_SURGERY_CENTER): Payer: 59 | Admitting: Gastroenterology

## 2020-08-07 ENCOUNTER — Other Ambulatory Visit: Payer: Self-pay

## 2020-08-07 ENCOUNTER — Encounter: Payer: Self-pay | Admitting: Gastroenterology

## 2020-08-07 VITALS — BP 135/94 | HR 67 | Temp 97.1°F | Resp 17 | Ht 65.0 in | Wt 223.0 lb

## 2020-08-07 DIAGNOSIS — D120 Benign neoplasm of cecum: Secondary | ICD-10-CM

## 2020-08-07 DIAGNOSIS — D122 Benign neoplasm of ascending colon: Secondary | ICD-10-CM | POA: Diagnosis not present

## 2020-08-07 DIAGNOSIS — D123 Benign neoplasm of transverse colon: Secondary | ICD-10-CM

## 2020-08-07 DIAGNOSIS — D124 Benign neoplasm of descending colon: Secondary | ICD-10-CM | POA: Diagnosis not present

## 2020-08-07 DIAGNOSIS — Z1211 Encounter for screening for malignant neoplasm of colon: Secondary | ICD-10-CM | POA: Diagnosis not present

## 2020-08-07 DIAGNOSIS — Z1212 Encounter for screening for malignant neoplasm of rectum: Secondary | ICD-10-CM | POA: Diagnosis not present

## 2020-08-07 MED ORDER — SODIUM CHLORIDE 0.9 % IV SOLN: 500.0000 mL | Freq: Once | INTRAVENOUS | Status: DC

## 2020-08-07 NOTE — Patient Instructions (Addendum)
Handouts were given to you on polyps, diverticulosis, and a high fiber diet with liberal fluid intake You may resume your current medications today. Await biopsy results.  It may take 1-3 weeks to receive pathology results. Please call if any questions or concerns.      YOU HAD AN ENDOSCOPIC PROCEDURE TODAY AT [facility]:   Refer to the procedure report that was given to you for any specific questions about what was found during the examination.  If the procedure report does not answer your questions, please call your gastroenterologist to clarify.  If you requested that your care partner not be given the details of your procedure findings, then the procedure report has been included in a sealed envelope for you to review at your convenience later.  YOU SHOULD EXPECT: Some feelings of bloating in the abdomen. Passage of more gas than usual.  Walking can help get rid of the air that was put into your GI tract during the procedure and reduce the bloating. If you had a lower endoscopy (such as a colonoscopy or flexible sigmoidoscopy) you may notice spotting of blood in your stool or on the toilet paper. If you underwent a bowel prep for your procedure, you may not have a normal bowel movement for a few days.  Please Note:  You might notice some irritation and congestion in your nose or some drainage.  This is from the oxygen used during your procedure.  There is no need for concern and it should clear up in a day or so.  SYMPTOMS TO REPORT IMMEDIATELY:   Following lower endoscopy (colonoscopy or flexible sigmoidoscopy):  Excessive amounts of blood in the stool  Significant tenderness or worsening of abdominal pains  Swelling of the abdomen that is new, acute  Fever of 100F or higher   For urgent or emergent issues, a gastroenterologist can be reached at any hour by calling [phone]. Do not use MyChart messaging for urgent concerns.    DIET:  We do recommend a small  meal at first, but then you may proceed to your regular diet.  Drink plenty of fluids but you should avoid alcoholic beverages for 24 hours.  ACTIVITY:  You should plan to take it easy for the rest of today and you should NOT DRIVE or use heavy machinery until tomorrow (because of the sedation medicines used during the test).    FOLLOW UP: Our staff will call the number listed on your records 48-72 hours following your procedure to check on you and address any questions or concerns that you may have regarding the information given to you following your procedure. If we do not reach you, we will leave a message.  We will attempt to reach you two times.  During this call, we will ask if you have developed any symptoms of COVID 19. If you develop any symptoms (ie: fever, flu-like symptoms, shortness of breath, cough etc.) before then, please call [phone].  If you test positive for Covid 19 in the 2 weeks post procedure, please call and report this information to [place].    If any biopsies were taken you will be contacted by phone or by letter within the next 1-3 weeks.  Please call us at [phone] if you have not heard about the biopsies in 3 weeks.    SIGNATURES/CONFIDENTIALITY: You and/or your care partner have signed paperwork which will be entered into your electronic medical record.  These signatures attest to the fact that  that the information above on your After Visit Summary has been reviewed and is understood.  Full responsibility of the confidentiality of this discharge information lies with you and/or your care-partner. YOU HAD AN ENDOSCOPIC PROCEDURE TODAY AT [facility]:   Refer to the procedure report that was given to you for any specific questions about what was found during the examination.  If the procedure report does not answer your questions, please call your gastroenterologist to clarify.  If you requested that your care partner not be given the details of your  procedure findings, then the procedure report has been included in a sealed envelope for you to review at your convenience later.  YOU SHOULD EXPECT: Some feelings of bloating in the abdomen. Passage of more gas than usual.  Walking can help get rid of the air that was put into your GI tract during the procedure and reduce the bloating. If you had a lower endoscopy (such as a colonoscopy or flexible sigmoidoscopy) you may notice spotting of blood in your stool or on the toilet paper. If you underwent a bowel prep for your procedure, you may not have a normal bowel movement for a few days.  Please Note:  You might notice some irritation and congestion in your nose or some drainage.  This is from the oxygen used during your procedure.  There is no need for concern and it should clear up in a day or so.  SYMPTOMS TO REPORT IMMEDIATELY:   Following lower endoscopy (colonoscopy or flexible sigmoidoscopy):  Excessive amounts of blood in the stool  Significant tenderness or worsening of abdominal pains  Swelling of the abdomen that is new, acute  Fever of 100F or higher    For urgent or emergent issues, a gastroenterologist can be reached at any hour by calling [phone]. Do not use MyChart messaging for urgent concerns.    DIET:  We do recommend a small meal at first, but then you may proceed to your regular diet.  Drink plenty of fluids but you should avoid alcoholic beverages for 24 hours.  ACTIVITY:  You should plan to take it easy for the rest of today and you should NOT DRIVE or use heavy machinery until tomorrow (because of the sedation medicines used during the test).    FOLLOW UP: Our staff will call the number listed on your records 48-72 hours following your procedure to check on you and address any questions or concerns that you may have regarding the information given to you following your procedure. If we do not reach you, we will leave a message.  We will attempt to reach you  two times.  During this call, we will ask if you have developed any symptoms of COVID 19. If you develop any symptoms (ie: fever, flu-like symptoms, shortness of breath, cough etc.) before then, please call [phone].  If you test positive for Covid 19 in the 2 weeks post procedure, please call and report this information to [place].    If any biopsies were taken you will be contacted by phone or by letter within the next 1-3 weeks.  Please call us at [phone] if you have not heard about the biopsies in 3 weeks.    SIGNATURES/CONFIDENTIALITY: You and/or your care partner have signed paperwork which will be entered into your electronic medical record.  These signatures attest to the fact that that the information above on your After Visit Summary has been reviewed and is understood.  Full responsibility of the confidentiality of this discharge information lies with you and/or your care-partner.

## 2020-08-07 NOTE — Progress Notes (Signed)
J. K. RN vital  Signs.

## 2020-08-07 NOTE — Progress Notes (Signed)
To pacu, VSS. Report to [email or web address] 

## 2020-08-07 NOTE — Op Note (Signed)
[facility] Patient Name: Kristy [name] Procedure Date: 08/07/2020 11:30 AM MRN: [id number] Endoscopist: Ladene Artist , MD Age: 50 Referring MD:  Date of Birth: Jun 23, 1971 Gender: Female Account #: [id number] Procedure:                Colonoscopy Indications:              Screening for colorectal malignant neoplasm Medicines:                Monitored Anesthesia Care Procedure:                Pre-Anesthesia Assessment:                           - Prior to the procedure, a History and Physical                            was performed, and patient medications and                            allergies were reviewed. The patient's tolerance of                            previous anesthesia was also reviewed. The risks                            and benefits of the procedure and the sedation                            options and risks were discussed with the patient.                            All questions were answered, and informed consent                            was obtained. Prior Anticoagulants: The patient has                            taken no previous anticoagulant or antiplatelet                            agents. ASA Grade Assessment: II - A patient with                            mild systemic disease. After reviewing the risks                            and benefits, the patient was deemed in                            satisfactory condition to undergo the procedure.                           After obtaining informed consent, the colonoscope  was passed under direct vision. Throughout the                            procedure, the patient's blood pressure, pulse, and                            oxygen saturations were monitored continuously. The                            Olympus CF-HQ190L ([id number]) Colonoscope was                            introduced through the anus and advanced to the the                            cecum,  identified by appendiceal orifice and                            ileocecal valve. The ileocecal valve, appendiceal                            orifice, and rectum were photographed. The quality                            of the bowel preparation was good. The colonoscopy                            was performed without difficulty. The patient                            tolerated the procedure well. Scope In: 11:38:49 AM Scope Out: 11:54:56 AM Scope Withdrawal Time: 0 hours 14 minutes 29 seconds  Total Procedure Duration: 0 hours 16 minutes 7 seconds  Findings:                 The perianal and digital rectal examinations were                            normal.                           Six sessile polyps were found in the descending                            colon (3), transverse colon (2), ascending colon                            (1) and cecum (1). The polyps were 6 to 8 mm in                            size. These polyps were removed with a cold snare.                            Resection and retrieval were complete.  Non-bleeding internal hemorrhoids were found during                            retroflexion. The hemorrhoids were Grade I                            (internal hemorrhoids that do not prolapse).                           The exam was otherwise without abnormality on                            direct and retroflexion views. Complications:            No immediate complications. Estimated blood loss:                            None. Estimated Blood Loss:     Estimated blood loss: none. Impression:               - Six 6 to 8 mm polyps in the descending colon, in                            the transverse colon, in the ascending colon and in                            the cecum, removed with a cold snare. Resected and                            retrieved.                           - Non-bleeding internal hemorrhoids.                           - The  examination was otherwise normal on direct                            and retroflexion views. Recommendation:           - Repeat colonoscopy after studies are complete for                            surveillance based on pathology results.                           - Patient has a contact number available for                            emergencies. The signs and symptoms of potential                            delayed complications were discussed with the                            patient. Return to normal activities tomorrow.  Written discharge instructions were provided to the                            patient.                           - Resume previous diet.                           - Continue present medications.                           - Await pathology results. Ladene Artist, MD 08/07/2020 11:58:22 AM This report has been signed electronically.

## 2020-08-07 NOTE — Progress Notes (Signed)
Called to room to assist during endoscopic procedure.  Patient ID and intended procedure confirmed with present staff. Received instructions for my participation in the procedure from the performing physician.  

## 2020-08-07 NOTE — Progress Notes (Signed)
Pt's states no medical or surgical changes since previsit or office visit. 

## 2020-08-08 ENCOUNTER — Ambulatory Visit (INDEPENDENT_AMBULATORY_CARE_PROVIDER_SITE_OTHER)
Admission: RE | Admit: 2020-08-08 | Discharge: 2020-08-08 | Disposition: A | Discharge status: Home / Self Care | Payer: 59 | Source: Ambulatory Visit | Attending: Internal Medicine | Admitting: Internal Medicine

## 2020-08-08 DIAGNOSIS — R058 Other specified cough: Secondary | ICD-10-CM

## 2020-08-08 DIAGNOSIS — R059 Cough, unspecified: Secondary | ICD-10-CM

## 2020-08-08 DIAGNOSIS — R918 Other nonspecific abnormal finding of lung field: Secondary | ICD-10-CM

## 2020-08-08 DIAGNOSIS — R911 Solitary pulmonary nodule: Secondary | ICD-10-CM

## 2020-08-09 ENCOUNTER — Telehealth: Payer: Self-pay

## 2020-08-09 NOTE — Progress Notes (Signed)
Spoke with pt and notified of results per Dr. Wert. Pt verbalized understanding and denied any questions. 

## 2020-08-09 NOTE — Telephone Encounter (Signed)
  Follow up Call-  Call back number 08/07/2020  Post procedure Call Back phone  # [phone]  Permission to leave phone message Yes  Some recent data might be hidden     Patient questions:  Do you have a fever, pain , or abdominal swelling? No. Pain Score  0 *  Have you tolerated food without any problems? Yes.    Have you been able to return to your normal activities? Yes.    Do you have any questions about your discharge instructions: Diet   No. Medications  No. Follow up visit  No.  Do you have questions or concerns about your Care? No.  Actions: * If pain score is 4 or above: 1. No action needed, pain <4.Have you developed a fever since your procedure? no  2.   Have you had an respiratory symptoms (SOB or cough) since your procedure? no  3.   Have you tested positive for COVID 19 since your procedure no  4.   Have you had any family members/close contacts diagnosed with the COVID 19 since your procedure?  no   If yes to any of these questions please route to Joylene John, RN and Joella Prince, RN

## 2020-08-10 ENCOUNTER — Other Ambulatory Visit: Payer: 59

## 2020-08-10 ENCOUNTER — Inpatient Hospital Stay: Admission: RE | Admit: 2020-08-10 | Payer: 59 | Source: Ambulatory Visit

## 2020-08-16 ENCOUNTER — Encounter: Payer: Self-pay | Admitting: Gastroenterology

## 2020-08-29 ENCOUNTER — Encounter: Payer: Self-pay | Admitting: Internal Medicine

## 2020-08-29 ENCOUNTER — Ambulatory Visit (INDEPENDENT_AMBULATORY_CARE_PROVIDER_SITE_OTHER): Payer: 59 | Admitting: Internal Medicine

## 2020-08-29 ENCOUNTER — Other Ambulatory Visit: Payer: Self-pay

## 2020-08-29 DIAGNOSIS — R911 Solitary pulmonary nodule: Secondary | ICD-10-CM | POA: Diagnosis not present

## 2020-08-29 DIAGNOSIS — R058 Other specified cough: Secondary | ICD-10-CM | POA: Diagnosis not present

## 2020-08-29 NOTE — Assessment & Plan Note (Signed)
See Abd  CT 03/02/20  0.5 cm RLL not present on CTa 09/03/2018 -  Pos fm hx lung ca / never smoker > repeat ct  08/08/20 : A previously noted 5 mm nodule in the lateral segment right middle lobe is resolved  Consistent with inflammatory nodule in this never smoker  No directed imaging or pulmonary f/u needed           Each maintenance medication was reviewed in detail including emphasizing most importantly the difference between maintenance and prns and under what circumstances the prns are to be triggered using an action plan format where appropriate.  Total time for H and P, chart review, counseling,   and generating customized AVS unique to this summary final office visit / same day charting = 25 min

## 2020-08-29 NOTE — Patient Instructions (Signed)
Prilosec should be 30 min before you eat but should be taken at least a few weeks to see any benefit (take before supper since symptoms seem worse after supper.  For drainage / throat tickle try take CHLORPHENIRAMINE  4 mg  (Chlortab 4mg   at [employer] should be easiest to find in the green box)  take one every 4 hours as needed - available over the counter- may cause drowsiness so start with just a bedtime dose or two and see how you tolerate it before trying in daytime     If you are satisfied with your treatment plan,  let your doctor know and he/she can either refill your medications or you can return here when your prescription runs out.     If in any way you are not 100% satisfied,  please tell us.  If 100% better, tell your friends!  Pulmonary follow up is as needed

## 2020-08-29 NOTE — Assessment & Plan Note (Signed)
Onset around 2000 - [facility] eval 2016 did not rec shots for dust/ ragweed allergy identified  -Trial of max gerd rx/ singulair 09/04/2016 >> improved 10/02/2016 > changed to prn gerd rx and ok until 04/2021 with sob / "congestion"  - placed back on daily singulair, ppi ac in and pepcid 20 in pm and d/c all inhalers  - Allergy profile 08/02/2020 >  Eos 0.1 /  IgE  2 - sinus CT 08/08/2020  No evidence of inflammatory sinus disease.  No evidence of sinus dz/ allergies/ asthma > rx for gerd aggressively and see if flares > f/u PCP if not as no pulmonary prbrlem identified.

## 2020-08-29 NOTE — Progress Notes (Signed)
Subjective:     Patient ID: Kristy [name], female   DOB: 10-28-1970    MRN: [id number]    Brief patient profile:  62 yobf never smoker works as Interior and spatial designer  with sinus issues around ? 2000 esp in fall better while she could get [facility] then 2016 eval by allergy  ? [facility] office pos dust/ ragweed didn't rec any shots and no f/u then variable sob assoc sense of congestion referred to pulmonary clinic 09/04/2016 by Boone Master.   History of Present Illness  09/04/2016 1st [facility] office visit/ Kristy [name]   Chief Complaint  Patient presents with  . Pulmonary Consult    Referred by Dr. Aleen Campi.  Pt c/o SOB for the past several months. She states also having some chest congestion. She has had cough x 2 wks- non prod and sometimes to the point of vomitting. Sometimes cough wakes her up at night.  She states she gets completely out of breath just walking one flight of stairs.   indolent onset 12/2015 of chest "congestion" and need to clear throat  present every day since onset onset   and unable to consistently lie flat due to choking sensation assoc hoarseness and dysphagia/  but s excess/ purulent sputum or mucus plugs. Cough so bad vomits / worse with albuterol  Better since started prilosec but not taking consistently Last pred x 6 days not recently ? Helped some  rec Start singulair 10 mg every evening  Change Omeprazole 40 mg   Take  30-60 min before first meal of the day and Pepcid (famotidine)  20 mg one @  bedtime until return to office - this is the best way to tell whether stomach acid is contributing to your problem.  GERD  Diet    10/02/2016  f/u ov/Kristy [name] re: cough since 12/2015 finally better c/w UACS  Chief Complaint  Patient presents with  . Follow-up    cough is much better  maint on ppi/h2hs and singulair with min daytime > noct cough and no sob rec Stay on the same medication x 2 months then try off prilosec but take pepcid 20 mg after bfast and supper an if your  symptoms flare off prilosec then you need to see a GI and I will be happy to refer GERD diet   Please schedule a follow up visit in 3 months but call sooner if needed > did not return    08/02/2020  Re-establish ov/Kristy [name] re:  Sob > cough >>  was taking prn  gerd rx but 3 m prior to OV  Resumed daily prilosec at bedtime and sense of nasal/chest congestion some better on arnuity but sense of  Congestion persists  Chief Complaint  Patient presents with  . Shortness of Breath    Intermittent dyspnea improved with inhaler prescribed by PCP recently  Dyspnea: steps are a problem, long walks at fast pace also cause sob some better on arnuity =breo same response Cough: lots of nasal congestion/ green mucus in ams x years  Sleeping: does fine p zyrtec hs flat bed  SABA use: none  02: no rec Singulair 10 mg every evening with Pepcid 20 mg - both after supper. Prilosec 40 mg Take 30-60 min before first meal of the day  Stop inhalers for now  Prednisone 10 mg take  4 each am x 2 days,   2 each am x 2 days,  1 each am x 2 days and stop  Please schedule a follow up office  visit in 4 weeks, sooner if needed  with all medications /inhalers/ solutions in hand so we can verify exactly what you are taking. This includes all medications from all doctors and over the counters   08/29/2020  f/u ov/Kristy [name] re: uacs/ better off all inhalers  Chief Complaint  Patient presents with  . Follow-up    Breathing is doing well and she states has very little cough- occ prod with clear sputum.    Dyspnea:  Better despite no inhalers  Cough: typically around 10 mg p 7 pm supper - worse p spicy foods  Sleeping: not typically waking her up  SABA use: none 02: none  Covid status:   vax 3    No obvious day to day or daytime variability or assoc excess/ purulent sputum or mucus plugs or hemoptysis or cp or chest tightness, subjective wheeze or overt sinus or hb symptoms.     Also denies any obvious fluctuation of symptoms  with weather or environmental changes or other aggravating or alleviating factors except as outlined above   No unusual exposure hx or h/o childhood pna/ asthma or knowledge of premature birth.  Current Allergies, Complete Past Medical History, Past Surgical History, Family History, and Social History were reviewed in [employer] record.  ROS  The following are not active complaints unless bolded Hoarseness, sore throat, dysphagia, dental problems, itching, sneezing,  nasal congestion or discharge of excess mucus or purulent secretions, ear ache,   fever, chills, sweats, unintended wt loss or wt gain, classically pleuritic or exertional cp,  orthopnea pnd or arm/hand swelling  or leg swelling, presyncope, palpitations, abdominal pain, anorexia, nausea, vomiting, diarrhea  or change in bowel habits or change in bladder habits, change in stools or change in urine, dysuria, hematuria,  rash, arthralgias, visual complaints, headache, numbness, weakness or ataxia or problems with walking or coordination,  change in mood or  memory.        Current Meds  Medication Sig  . cetirizine (ZYRTEC) 10 MG tablet Take 1 tablet (10 mg total) by mouth daily.  . clotrimazole-betamethasone (LOTRISONE) cream Apply 1 application topically 2 (two) times daily.  . famotidine (PEPCID) 20 MG tablet One after supper  . montelukast (SINGULAIR) 10 MG tablet Take 1 tablet (10 mg total) by mouth at bedtime.  Marland Kitchen omeprazole (PRILOSEC) 40 MG capsule TAKE 1 CAPSULE BY MOUTH EVERY DAY                Objective:   Physical Exam     08/29/2020       219  08/02/2020       217  10/02/2016      213   09/04/16 209 lb (94.8 kg)  08/27/16 216 lb (98 kg)  08/20/16 214 lb 9.6 oz (97.3 kg)       Vital signs reviewed  08/29/2020  - Note at rest 02 sats  97% on RA   General appearance:    amb  Bf nad     HEENT : pt wearing mask not removed for exam due to covid -19 concerns.    NECK :  without  JVD/Nodes/TM/ nl carotid upstrokes bilaterally   LUNGS: no acc muscle use,  Nl contour chest which is clear to A and P bilaterally without cough on insp or exp maneuvers   CV:  RRR  no s3 or murmur or increase in P2, and no edema   ABD:  soft and nontender with nl inspiratory excursion in  the supine position. No bruits or organomegaly appreciated, bowel sounds nl  MS:  Nl gait/ ext warm without deformities, calf tenderness, cyanosis or clubbing No obvious joint restrictions   SKIN: warm and dry without lesions    NEURO:  alert, approp, nl sensorium with  no motor or cerebellar deficits apparent.              Assessment:

## 2020-09-02 ENCOUNTER — Other Ambulatory Visit: Payer: Self-pay | Admitting: Medical

## 2020-10-24 ENCOUNTER — Encounter: Payer: Self-pay | Admitting: Family Medicine

## 2020-10-24 ENCOUNTER — Ambulatory Visit: Payer: 59 | Admitting: Family Medicine

## 2020-10-24 VITALS — BP 126/70 | HR 76 | Temp 99.0°F | Wt 218.6 lb

## 2020-10-24 DIAGNOSIS — N029 Recurrent and persistent hematuria with unspecified morphologic changes: Secondary | ICD-10-CM

## 2020-10-24 DIAGNOSIS — N3001 Acute cystitis with hematuria: Secondary | ICD-10-CM | POA: Diagnosis not present

## 2020-10-24 LAB — POCT URINALYSIS DIP (PROADVANTAGE DEVICE)
Bilirubin, UA: NEGATIVE
Glucose, UA: NEGATIVE mg/dL
Ketones, POC UA: NEGATIVE mg/dL
Nitrite, UA: NEGATIVE
Specific Gravity, Urine: 1.02
Urobilinogen, Ur: 0.2
pH, UA: 6 (ref 5.0–8.0)

## 2020-10-24 MED ORDER — SULFAMETHOXAZOLE-TRIMETHOPRIM 800-160 MG PO TABS: 1.0000 | ORAL_TABLET | Freq: Two times a day (BID) | ORAL | 0 refills | Status: DC

## 2020-10-24 NOTE — Progress Notes (Signed)
   Subjective:    Patient ID: Kristy [name], female    DOB: [birth year], 50 y.o.   MRN: [id number]  HPI She complains of urinary frequency, urgency and dysuria.  She also states she has a previous history of hematuria that was apparently worked up by urology and found to be benign.   Review of Systems     Objective:   Physical Exam Alert and in no distress.  Urine microscopic did show multiple red cells as well as white cells.       Assessment & Plan:  Acute cystitis with hematuria - Plan: sulfamethoxazole-trimethoprim (BACTRIM DS) 800-160 MG tablet  Benign hematuria I explained that as long as her symptoms go away, no further intervention needed.

## 2020-10-24 NOTE — Addendum Note (Signed)
Addended by: Elyse Jarvis on: 10/24/2020 04:35 PM   Modules accepted: Orders

## 2020-10-24 NOTE — Patient Instructions (Signed)
Take all the antibiotic but also use a medicine called Azo-Standard

## 2020-10-25 ENCOUNTER — Other Ambulatory Visit: Payer: Self-pay

## 2020-10-25 ENCOUNTER — Ambulatory Visit: Payer: 59 | Admitting: Family Medicine

## 2020-10-25 VITALS — BP 118/86 | HR 93 | Temp 98.4°F | Ht 65.0 in | Wt 218.4 lb

## 2020-10-25 DIAGNOSIS — M2242 Chondromalacia patellae, left knee: Secondary | ICD-10-CM

## 2020-10-25 NOTE — Progress Notes (Signed)
   Subjective:    Patient ID: Kristy [name], female    DOB: 07/05/1971, 50 y.o.   MRN: [id number]  HPI She is here for evaluation of left knee pain.  She injured her knees one year ago causing bilateral pain but at this point is only having trouble with her left knee.  She notes that it is worse if she sits for long periods of time and then straightens her knee out, the pain will get better.  She also notes pain especially going downstairs.  She does note some grinding sensation in it but no locking.   Review of Systems     Objective:   Physical Exam Left knee exam shows no effusion.  Compression test was negative.  Slight tenderness over the medial patellar area.  Negative anterior drawer.  Medial lateral collateral ligaments normal.  McMurray's testing negative.       Assessment & Plan:  Chondromalacia, patella, left - Plan: Ambulatory referral to Physical Therapy I explained the condition to her in detail and therapy behind it.  I will send her to physical therapy and encouraged her to return here in 6 weeks for follow-up visit.  She was comfortable with that.

## 2020-10-25 NOTE — Patient Instructions (Signed)
Patellofemoral Pain Syndrome  Patellofemoral pain syndrome is a condition in which the tissue (cartilage) on the underside of the kneecap (patella) softens or breaks down. This causes pain in the front of the knee. The condition is also called runner's knee or chondromalacia patella. Patellofemoral pain syndrome is most common in young adults who are active in sports. The knee is the largest joint in the body. The patella covers the front of the knee and is attached to muscles above and below the knee. The underside of the patella is covered with a smooth type of cartilage (synovium). The smooth surface helps the patella glide easily when you move your knee. Patellofemoral pain syndrome causes swelling in the joint linings and bone surfaces in the knee. What are the causes? This condition may be caused by:  Overuse of the knee.  Poor alignment of your knee joints.  Weak leg muscles.  A direct hit to your kneecap. What increases the risk? You are more likely to develop this condition if:  You do a lot of activities that can wear down your kneecap. These include: ? Running. ? Squatting. ? Climbing stairs.  You start a new physical activity or exercise program.  You wear shoes that do not fit well.  You do not have good leg strength.  You are overweight. What are the signs or symptoms? The main symptom of this condition is knee pain. This may feel like a dull, aching pain underneath your patella, in the front of your knee. There may be a popping or cracking sound when you move your knee. Pain may get worse with:  Exercise.  Climbing stairs.  Running.  Jumping.  Squatting.  Kneeling.  Sitting for a long time.  Moving or pushing on your patella. How is this diagnosed? This condition may be diagnosed based on:  Your symptoms and medical history. You may be asked about your recent physical activities and which ones cause knee pain.  A physical exam. This may  include: ? Moving your patella back and forth. ? Checking your range of knee motion. ? Having you squat or jump to see if you have pain. ? Checking the strength of your leg muscles.  Imaging tests to confirm the diagnosis. These may include an MRI of your knee. How is this treated? This condition may be treated at home with rest, ice, compression, and elevation (RICE).  Other treatments may include:  NSAIDs, such as ibuprofen.  Physical therapy to stretch and strengthen your leg muscles.  Shoe inserts (orthotics) to take stress off your knee. Follow these instructions at home: ?  Managing pain, stiffness, and swelling  If directed, put ice on the painful area. To do this: ? If you have a removable brace, remove it as told by your health care provider. ? Put ice in a plastic bag. ? Place a towel between your skin and the bag. ? Leave the ice on for 20 minutes, 2-3 times a day. ? Remove the ice if your skin turns bright red. This is very important. If you cannot feel pain, heat, or cold, you have a greater risk of damage to the area.  Move your toes often to reduce stiffness and swelling.  Raise (elevate) the injured area above the level of your heart while you are sitting or lying down.   Activity  Rest your knee.  Avoid activities that cause knee pain.  Perform stretching and strengthening exercises as told by your health care provider or physical therapist.  Return to your normal activities as told by your health care provider. Ask your health care provider what activities are safe for you. General instructions  Take over-the-counter and prescription medicines only as told by your health care provider.  .  Do not use any products that contain nicotine or tobacco, such as cigarettes, e-cigarettes, and chewing tobacco. These can delay healing. If you need help quitting, ask your health care provider.  Keep all follow-up visits. This is important. Contact a health care  provider if:  Your symptoms get worse.  You are not improving with home care. Summary  Patellofemoral pain syndrome is a condition in which the tissue (cartilage) on the underside of the kneecap (patella) softens or breaks down.  This condition causes swelling in the joint linings and bone surfaces in the knee. This leads to pain in the front of the knee. Use 2 Aleve twice per day for the next several weeks and that such from physical therapy

## 2020-10-30 ENCOUNTER — Ambulatory Visit: Payer: 59 | Admitting: Obstetrics & Gynecology

## 2020-10-31 ENCOUNTER — Ambulatory Visit: Payer: 59 | Admitting: Obstetrics & Gynecology

## 2020-11-02 ENCOUNTER — Encounter: Payer: Self-pay | Admitting: Family Medicine

## 2020-11-02 ENCOUNTER — Other Ambulatory Visit: Payer: Self-pay

## 2020-11-02 ENCOUNTER — Ambulatory Visit: Payer: 59 | Admitting: Family Medicine

## 2020-11-02 ENCOUNTER — Ambulatory Visit (INDEPENDENT_AMBULATORY_CARE_PROVIDER_SITE_OTHER): Payer: 59 | Admitting: Family Medicine

## 2020-11-02 VITALS — BP 122/80 | HR 94 | Temp 98.8°F | Resp 16 | Wt 221.6 lb

## 2020-11-02 DIAGNOSIS — R0789 Other chest pain: Secondary | ICD-10-CM | POA: Diagnosis not present

## 2020-11-02 DIAGNOSIS — Z9109 Other allergy status, other than to drugs and biological substances: Secondary | ICD-10-CM | POA: Diagnosis not present

## 2020-11-02 DIAGNOSIS — R062 Wheezing: Secondary | ICD-10-CM

## 2020-11-02 MED ORDER — ALBUTEROL SULFATE HFA 108 (90 BASE) MCG/ACT IN AERS: 2.0000 | INHALATION_SPRAY | Freq: Four times a day (QID) | RESPIRATORY_TRACT | 0 refills | Status: DC | PRN

## 2020-11-02 NOTE — Progress Notes (Signed)
   Subjective:    Patient ID: Kristy [name], female    DOB: March 20, 1971, 50 y.o.   MRN: [id number]  HPI Chief Complaint  Patient presents with  . Allergic Reaction    Allergic reaction to dog. Itchy eyes,wheezing Saturday. Had to get inhaler and eye drops for the eyes. Then had the sneezing and coughing. Comes an goes now, not 100%. Still some wheezing   Here with complaints of allergies that flared up over the weekend when around a dog that she does not usually spend time with. Reports having a full blown allergy attack including itchy, watery eyes, sneezing, coughing, wheezing and chest tightness.    States she is still having issues with nasal congestion, coughing and wheezing at bedtime.   Taking Zyrtec as needed and Singulair. She took Benadryl right away. She also used Flonase and primatine mist. She did not have   She has Breo and arnuity that was prescribed by her pulmonologist. She does not have albuterol.   Denies fever, chills, dizziness, palpitations, shortness of breath, abdominal pain, N/V/D, LE edema.      Review of Systems Pertinent positives and negatives in the history of present illness.     Objective:   Physical Exam BP 122/80   Pulse 94   Temp 98.8 F (37.1 C)   Resp 16   Wt 221 lb 9.6 oz (100.5 kg)   SpO2 98%   BMI 36.88 kg/m   Alert and in no distress. Cardiac exam shows a regular sinus rhythm without murmurs or gallops. Lungs are clear to auscultation. Skin is warm and dry.        Assessment & Plan:  Wheezing - Plan: albuterol (VENTOLIN HFA) 108 (90 Base) MCG/ACT inhaler  Environmental allergies  Tightness in chest - Plan: albuterol (VENTOLIN HFA) 108 (90 Base) MCG/ACT inhaler  No red flag symptoms.  Discussed maximizing allergy treatment with antihistamine, Flonase, and continue Singulair. I will prescribe albuterol for her to try short term and then she should follow up with her PCP Dorothea Ogle or her pulmonologist.

## 2020-11-02 NOTE — Patient Instructions (Signed)
Continue taking Singulair, Zyrtec and use Flonase daily for now.  You may use the albuterol inhaler as needed for wheezing and chest tightness  You should continue to improve over the next 2 to 3 days

## 2020-11-06 ENCOUNTER — Telehealth: Payer: Self-pay | Admitting: Internal Medicine

## 2020-11-06 NOTE — Telephone Encounter (Signed)
Called and spoke with pt and she is aware of appt scheduled with MW on 04/26 for issues with reflux after she had an allergic reaction last week.  She stated that she has been using her reflux meds.

## 2020-11-07 ENCOUNTER — Encounter: Payer: Self-pay | Admitting: Obstetrics & Gynecology

## 2020-11-07 ENCOUNTER — Encounter: Payer: Self-pay | Admitting: Internal Medicine

## 2020-11-07 ENCOUNTER — Ambulatory Visit (INDEPENDENT_AMBULATORY_CARE_PROVIDER_SITE_OTHER): Payer: 59 | Admitting: Internal Medicine

## 2020-11-07 ENCOUNTER — Ambulatory Visit (INDEPENDENT_AMBULATORY_CARE_PROVIDER_SITE_OTHER): Payer: 59 | Admitting: Obstetrics & Gynecology

## 2020-11-07 ENCOUNTER — Other Ambulatory Visit (HOSPITAL_COMMUNITY)
Admission: RE | Admit: 2020-11-07 | Discharge: 2020-11-07 | Disposition: A | Discharge status: Home / Self Care | Payer: 59 | Source: Ambulatory Visit | Attending: Obstetrics & Gynecology | Admitting: Obstetrics & Gynecology

## 2020-11-07 ENCOUNTER — Other Ambulatory Visit: Payer: Self-pay

## 2020-11-07 VITALS — BP 120/70 | Ht 65.0 in | Wt 222.0 lb

## 2020-11-07 DIAGNOSIS — N870 Mild cervical dysplasia: Secondary | ICD-10-CM

## 2020-11-07 DIAGNOSIS — R058 Other specified cough: Secondary | ICD-10-CM | POA: Diagnosis not present

## 2020-11-07 MED ORDER — PREDNISONE 10 MG PO TABS: ORAL_TABLET | ORAL | 0 refills | Status: DC

## 2020-11-07 MED ORDER — HYDROCODONE-HOMATROPINE 5-1.5 MG/5ML PO SYRP: 5.0000 mL | ORAL_SOLUTION | Freq: Four times a day (QID) | ORAL | 0 refills | Status: DC | PRN

## 2020-11-07 MED ORDER — HYDROCODONE-HOMATROPINE 5-1.5 MG/5ML PO SYRP
5.0000 mL | ORAL_SOLUTION | ORAL | 0 refills | Status: DC | PRN
Start: 2020-11-07 — End: 2021-01-26

## 2020-11-07 NOTE — Progress Notes (Signed)
HPI:  Patient is a 50 y.o. [id number] presenting for follow up evaluation of abnormal PAP smear in the past.  Her last PAP was 7 months ago and was abnormal: ASCUS w POS HPV. She has had a prior colposcopy. 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  PMHx: She  has a past medical history of Allergy, BRCA negative (11/2012, 4/19), Dysrhythmia, Family history of breast cancer, Family history of premature CAD, GERD (gastroesophageal reflux disease), H. pylori infection (10/2010), H/O exercise stress test (2018), Increased risk of breast cancer (11/2012; 4/19), Iron deficiency anemia, unspecified, Obesity, Seasonal allergies, Unspecified vitamin D deficiency, and Vitamin D deficiency (2017). Also,  has a past surgical history that includes Cesarean section; Wisdom tooth extraction; Dilatation & currettage/hysteroscopy with novasure ablation (N/A, 09/14/2015); Tubal ligation (Bilateral, 09/14/2015); Laparoscopic bilateral salpingectomy (Bilateral, 09/14/2015); Vulvar lesion removal (N/A, 09/14/2015); IUD removal (N/A, 09/14/2015); and Breast biopsy (Right, 1980s)., family history includes Alcohol abuse in her father; Breast cancer (age of onset: 37) in her mother; Breast cancer (age of onset: 16) in her maternal grandmother; CAD in her brother; Cancer in her father; Esophageal cancer in her father; Heart disease (age of onset: 40) in her mother; Heart disease (age of onset: 90) in her father; Hypertension in her brother, brother, and sister.,  reports that she has never smoked. She has never used smokeless tobacco. She reports that she does not drink alcohol and does not use drugs.  She has a current medication list which includes the following prescription(s): omeprazole, albuterol, cetirizine, clotrimazole-betamethasone, famotidine, montelukast, and sulfamethoxazole-trimethoprim, and the following Facility-Administered Medications: sodium chloride. Also, is allergic to iodinated diagnostic agents.  Review of Systems  All other  systems reviewed and are negative.   Objective: BP 120/70   Ht 5' 5" (1.651 m)   Wt 222 lb (100.7 kg)   BMI 36.94 kg/m  Filed Weights   11/07/20 1002  Weight: 222 lb (100.7 kg)   Body mass index is 36.94 kg/m.  Physical examination Physical Exam Constitutional:      General: She is not in acute distress.    Appearance: She is well-developed.  Genitourinary:     No vaginal erythema or bleeding.      Right Adnexa: not tender and no mass present.    Left Adnexa: not tender and no mass present.    No cervical motion tenderness, discharge, polyp or nabothian cyst.     Uterus is not enlarged.     No uterine mass detected. HENT:     Head: Normocephalic and atraumatic.     Nose: Nose normal.  Abdominal:     General: There is no distension.     Palpations: Abdomen is soft.     Tenderness: There is no abdominal tenderness.  Musculoskeletal:        General: Normal range of motion.  Neurological:     Mental Status: She is alert and oriented to person, place, and time.     Cranial Nerves: No cranial nerve deficit.  Skin:    General: Skin is warm and dry.  Psychiatric:        Attention and Perception: Attention normal.        Mood and Affect: Mood and affect normal.        Speech: Speech normal.        Behavior: Behavior normal.        Thought Content: Thought content normal.        Judgment: Judgment normal.     ASSESSMENT:  History of Cervical Dysplasia  Plan:  1.  I discussed the grading system of pap smears and HPV high risk viral types.   2. Follow up PAP 6 months, vs intervention if high grade dysplasia identified. 3. Treatment of persistantly abnormal PAP smears and cervical dysplasia, even mild, is discussed w pt today in detail, as well as the pros and cons of Cryo and LEEP procedures. Will consider and discuss after results.  A total of 20 minutes were spent face-to-face with the patient as well as preparation, review, communication, and documentation during  this encounter.    Barnett Applebaum, MD, [facility], [facility] 11/07/2020  10:26 AM

## 2020-11-07 NOTE — Patient Instructions (Addendum)
At onset of any cough make sure  Prilosec Take 30- 60 min before your first and last meals of the day   For drainage / throat tickle /sneezing instead  Of zyrtec try take CHLORPHENIRAMINE  4 mg  (Chlortab 4mg   at [employer] should be easiest to find in the green box)  take one every 4 hours as needed - available over the counter- may cause drowsiness so start with just a bedtime dose or two and see how you tolerate it before trying in daytime    Continue singulair/ pepcid daily   Prednisone 10 mg take  4 each am x 2 days,   2 each am x 2 days,  1 each am x 2 days and stop    For cough > hycodan 1-2 tsp every 4 hours as needed  GERD (REFLUX)  is an extremely common cause of respiratory symptoms just like yours , many times with no obvious heartburn at all.    It can be treated with medication, but also with lifestyle changes including elevation of the head of your bed (ideally with 6 -8inch blocks under the headboard of your bed),  Smoking cessation, avoidance of late meals, excessive alcohol, and avoid fatty foods, chocolate, peppermint, colas, red wine, and acidic juices such as orange juice.  NO MINT OR MENTHOL PRODUCTS SO NO COUGH DROPS  USE SUGARLESS CANDY INSTEAD (Jolley ranchers or Stover's or Life Savers) or even ice chips will also do - the key is to swallow to prevent all throat clearing. NO OIL BASED VITAMINS - use powdered substitutes.  Avoid fish oil when coughing.    Please schedule a follow up office visit in 4 weeks, sooner if needed

## 2020-11-07 NOTE — Progress Notes (Signed)
Subjective:     Patient ID: Kristy [name], female   DOB: 1971/05/09    MRN: [id number]    Brief patient profile:  24 yobf never smoker works as Interior and spatial designer  with sinus issues around ? 2000 esp in fall better while she could get T[facility] then 2016 eval by allergy  ? [facility] office pos dust/ ragweed didn't rec any shots and no f/u then variable sob assoc sense of congestion referred to pulmonary clinic 09/04/2016 by Boone Master.   History of Present Illness  09/04/2016 1st [facility] office visit/ Kristy [name]   Chief Complaint  Patient presents with  . Pulmonary Consult    Referred by Dr. Aleen Campi.  Pt c/o SOB for the past several months. She states also having some chest congestion. She has had cough x 2 wks- non prod and sometimes to the point of vomitting. Sometimes cough wakes her up at night.  She states she gets completely out of breath just walking one flight of stairs.   indolent onset 12/2015 of chest "congestion" and need to clear throat  present every day since onset onset   and unable to consistently lie flat due to choking sensation assoc hoarseness and dysphagia/  but s excess/ purulent sputum or mucus plugs. Cough so bad vomits / worse with albuterol  Better since started prilosec but not taking consistently Last pred x 6 days not recently ? Helped some  rec Start singulair 10 mg every evening  Change Omeprazole 40 mg   Take  30-60 min before first meal of the day and Pepcid (famotidine)  20 mg one @  bedtime until return to office - this is the best way to tell whether stomach acid is contributing to your problem.  GERD  Diet    10/02/2016  f/u ov/Kristy [name] re: cough since 12/2015 finally better c/w UACS  Chief Complaint  Patient presents with  . Follow-up    cough is much better  maint on ppi/h2hs and singulair with min daytime > noct cough and no sob rec Stay on the same medication x 2 months then try off prilosec but take pepcid 20 mg after bfast and supper an if your  symptoms flare off prilosec then you need to see a GI and I will be happy to refer GERD diet   Please schedule a follow up visit in 3 months but call sooner if needed > did not return    08/02/2020  Re-establish ov/Kristy [name] re:  Sob > cough >>  was taking prn  gerd rx but 3 m prior to OV  Resumed daily prilosec at bedtime and sense of nasal/chest congestion some better on arnuity but sense of  Congestion persists  Chief Complaint  Patient presents with  . Shortness of Breath    Intermittent dyspnea improved with inhaler prescribed by PCP recently  Dyspnea: steps are a problem, long walks at fast pace also cause sob some better on arnuity =breo same response Cough: lots of nasal congestion/ green mucus in ams x years  Sleeping: does fine p zyrtec hs flat bed  SABA use: none  02: no rec Singulair 10 mg every evening with Pepcid 20 mg - both after supper. Prilosec 40 mg Take 30-60 min before first meal of the day  Stop inhalers for now  Prednisone 10 mg take  4 each am x 2 days,   2 each am x 2 days,  1 each am x 2 days and stop  Please schedule a follow up office  visit in 4 weeks, sooner if needed  with all medications /inhalers/ solutions in hand so we can verify exactly what you are taking. This includes all medications from all doctors and over the counters   08/29/2020  f/u ov/Kristy [name] re: uacs/ better off all inhalers  Chief Complaint  Patient presents with  . Follow-up    Breathing is doing well and she states has very little cough- occ prod with clear sputum.    Dyspnea:  Better despite no inhalers  Cough: typically around 10 mg p 7 pm supper - worse p spicy foods  Sleeping: not typically waking her up  SABA use: none 02: none  Covid status:   vax 3  rec Prilosec should be 30 min before you eat but should be taken at least a few weeks to see any benefit (take before supper since symptoms seem worse after supper. For drainage / throat tickle try take CHLORPHENIRAMINE  4 mg  Pulmonary  follow up is as needed     11/07/2020  f/u ov/Kristy [name] re:  Onset 10/28/20  15 min p dog exposure in [place] > eyes watering /nasal drainage and stayed in same house exp thru 10/30/20 but still coughing to point of vomiting worst 11/04/20 > seen 4/21 by  PCP rx albuterol / no longer  Chief Complaint  Patient presents with  . Follow-up    Had reaction 2 wks. Ago, sob-better now, cough lt. Yellow, coughing so hard 4 days ago vomited, no fcs, occass. Wheezing, pnd  . Acute Visit  Dyspnea:  Mostly with coughing  Cough: slt yellow/ worse p saba  Sleeping: 45 degrees otherwise coughs SABA use: once or twice  02: yes  Covid status:   vax 3 pfizer    No obvious day to day or daytime variability or assoc excess/ purulent sputum or mucus plugs or hemoptysis or cp or chest tightness, subjective wheeze or overt sinus or hb symptoms.    . Also denies any obvious fluctuation of symptoms with weather or environmental changes or other aggravating or alleviating factors except as outlined above   No unusual exposure hx or h/o childhood pna/ asthma or knowledge of premature birth.  Current Allergies, Complete Past Medical History, Past Surgical History, Family History, and Social History were reviewed in [employer] record.  ROS  The following are not active complaints unless bolded Hoarseness, sore throat, dysphagia, dental problems, itching, sneezing,  nasal congestion or discharge of excess mucus or purulent secretions, ear ache,   fever, chills, sweats, unintended wt loss or wt gain, classically pleuritic or exertional cp,  orthopnea pnd or arm/hand swelling  or leg swelling, presyncope, palpitations, abdominal pain, anorexia, nausea, vomiting, diarrhea  or change in bowel habits or change in bladder habits, change in stools or change in urine, dysuria, hematuria,  rash, arthralgias, visual complaints, headache, numbness, weakness or ataxia or problems with walking or coordination,  change  in mood or  memory.        Current Meds  Medication Sig  . albuterol (VENTOLIN HFA) 108 (90 Base) MCG/ACT inhaler Inhale 2 puffs into the lungs every 6 (six) hours as needed for wheezing or shortness of breath.  . cetirizine (ZYRTEC) 10 MG tablet Take 1 tablet (10 mg total) by mouth daily.  . clotrimazole-betamethasone (LOTRISONE) cream Apply 1 application topically 2 (two) times daily.  . famotidine (PEPCID) 20 MG tablet One after supper  . montelukast (SINGULAIR) 10 MG tablet Take 1 tablet (10 mg total) by  mouth at bedtime.  Marland Kitchen omeprazole (PRILOSEC) 40 MG capsule TAKE 1 CAPSULE BY MOUTH EVERY DAY (Patient taking differently: Take 40 mg by mouth as needed. TAKE 1 CAPSULE BY MOUTH EVERY DAY)   Current Facility-Administered Medications for the 11/07/20 encounter (Office Visit) with Nyoka Cowden, MD  Medication  . 0.9 %  sodium chloride infusion                        Objective:   Physical Exam   11/07/2020       222 08/29/2020       219  08/02/2020       217  10/02/2016      213   09/04/16 209 lb (94.8 kg)  08/27/16 216 lb (98 kg)  08/20/16 214 lb 9.6 oz (97.3 kg)        Vital signs reviewed  11/07/2020  - Note at rest 02 sats  100% on RA   General appearance:    Obese bf harsh upper airway cough   HEENT : pt wearing mask not removed for exam due to covid -19 concerns.    NECK :  without JVD/Nodes/TM/ nl carotid upstrokes bilaterally   LUNGS: no acc muscle use,  Nl contour chest which is clear to A and P bilaterally without cough on insp or exp maneuvers   CV:  RRR  no s3 or murmur or increase in P2, and no edema   ABD:  Mod obese soft and nontender with nl inspiratory excursion in the supine position. No bruits or organomegaly appreciated, bowel sounds nl  MS:  Nl gait/ ext warm without deformities, calf tenderness, cyanosis or clubbing No obvious joint restrictions   SKIN: warm and dry without lesions    NEURO:  alert, approp, nl sensorium with  no motor or  cerebellar deficits apparent.                 Assessment:

## 2020-11-08 ENCOUNTER — Encounter: Payer: Self-pay | Admitting: Internal Medicine

## 2020-11-08 NOTE — Assessment & Plan Note (Signed)
Onset around 2000 - [facility] eval 2016 did not rec shots for dust/ ragweed allergy identified  -Trial of max gerd rx/ singulair 09/04/2016 >> improved 10/02/2016 > changed to prn gerd rx and ok until 04/2021 with sob / "congestion"  - placed back on daily singulair, ppi ac in and pepcid 20 in pm and d/c all inhalers  - Allergy profile 08/02/2020 >  Eos 0.1 /  IgE  2 - sinus CT 08/08/2020  No evidence of inflammatory sinus disease. - Flare mid April 9211 p dog exp > cyclical cough rx / no inhalers   Of the three most common causes of  Sub-acute / recurrent or chronic cough, only one (GERD)  can actually contribute to/ trigger  the other two (asthma and post nasal drip syndrome)  and perpetuate the cylce of cough.  While not intuitively obvious, many patients with chronic low grade reflux do not cough until there is a primary insult that disturbs the protective epithelial barrier and exposes sensitive nerve endings.   This is typically viral but can due to PNDS and  either may apply here.   The point is that once this occurs, it is difficult to eliminate the cycle  using anything but a maximally effective acid suppression regimen at least in the short run, accompanied by an appropriate diet to address non acid GERD and control / eliminate the cough itself for at least 3 days with hycodan,  Address pnds with 1st gen H1 blockers per guidelines  >>> also so added 6 day taper off  Prednisone starting at 40 mg per day in case of component of Th-2 driven upper or lower airways inflammation (if cough responds short term only to relapse before return while will on full rx for uacs (as above), then  that would point to allergic rhinitis/ asthma or eos bronchitis as alternative dx)    F/u 4 weeks         Each maintenance medication was reviewed in detail including emphasizing most importantly the difference between maintenance and prns and under what circumstances the prns are to be triggered using an action plan  format where appropriate.  Total time for H and P, chart review, counseling,  and generating customized AVS unique to this acute office visit / same day charting > 30 min

## 2020-11-09 LAB — CYTOLOGY - PAP: Diagnosis: NEGATIVE

## 2020-11-20 ENCOUNTER — Other Ambulatory Visit: Payer: Self-pay

## 2020-11-20 ENCOUNTER — Encounter: Payer: Self-pay | Admitting: Physical Therapy

## 2020-11-20 ENCOUNTER — Ambulatory Visit: Payer: 59 | Attending: Family Medicine | Admitting: Physical Therapy

## 2020-11-20 DIAGNOSIS — M6281 Muscle weakness (generalized): Secondary | ICD-10-CM

## 2020-11-20 DIAGNOSIS — M25562 Pain in left knee: Secondary | ICD-10-CM | POA: Insufficient documentation

## 2020-11-20 DIAGNOSIS — G8929 Other chronic pain: Secondary | ICD-10-CM

## 2020-11-20 DIAGNOSIS — M25561 Pain in right knee: Secondary | ICD-10-CM | POA: Diagnosis present

## 2020-11-20 NOTE — Therapy (Signed)
[place], [place], [address] Phone: [phone]   Fax:  [phone]  Physical Therapy Evaluation  Patient Details  Name: Kristy [name] MRN: [id number] Date of Birth: [birth year] Referring Provider (PT): Denita Lung, MD   Encounter Date: 11/20/2020   PT End of Session - 11/20/20 1021    Visit Number 1    Number of Visits 4    Date for PT Re-Evaluation 01/15/21    Authorization Type [employer]    Authorization Time Period FOTO by 6th visit    PT Start Time 1010    PT Stop Time 1055    PT Time Calculation (min) 45 min    Activity Tolerance Patient tolerated treatment well    Behavior During Therapy [facility] for tasks assessed/performed           Past Medical History:  Diagnosis Date  . Allergy   . BRCA negative 11/2012, 4/19   BRCA neg 2014; MyRisk update neg except POLE VUS 4/19  . Dysrhythmia    a. 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 Family history of breast cancer   . Family history of premature CAD   . GERD (gastroesophageal reflux disease)   . H. pylori infection 10/2010  . H/O exercise stress test 2018  . Increased risk of breast cancer 11/2012; 4/19   5/14 IBIS=25.6%; 4/19 IBIS=25.8%  . Iron deficiency anemia, unspecified    history of, normal hemoglobin 06/2019  . Obesity   . Seasonal allergies    ragweed, tree pollen, mild and dust per testing 08/2012  . Unspecified vitamin D deficiency   . Vitamin D deficiency 2017    Past Surgical History:  Procedure Laterality Date  . BREAST BIOPSY Right 1980s   neg  . CESAREAN SECTION    . [place] N/A 09/14/2015   Procedure: DILATATION & CURETTAGE/HYSTEROSCOPY WITH NOVASURE ABLATION;  Surgeon: Gae Dry, MD;  Location: [facility];  Service: Gynecology;  Laterality: N/A;  . IUD REMOVAL N/A 09/14/2015   Procedure: INTRAUTERINE DEVICE (IUD) REMOVAL;  Surgeon: Gae Dry, MD;   Location: [facility];  Service: Gynecology;  Laterality: N/A;  . LAPAROSCOPIC BILATERAL SALPINGECTOMY Bilateral 09/14/2015   Procedure: LAPAROSCOPIC BILATERAL SALPINGECTOMY;  Surgeon: Gae Dry, MD;  Location: [facility];  Service: Gynecology;  Laterality: Bilateral;  . TUBAL LIGATION Bilateral 09/14/2015   Procedure: BILATERAL TUBAL LIGATION;  Surgeon: Gae Dry, MD;  Location: [facility];  Service: Gynecology;  Laterality: Bilateral;  . VULVAR LESION REMOVAL N/A 09/14/2015   Procedure: VULVAR LESION;  Surgeon: Gae Dry, MD;  Location: [facility];  Service: Gynecology;  Laterality: N/A;  . WISDOM TOOTH EXTRACTION      There were no vitals filed for this visit.    Subjective Assessment - 11/20/20 1013    Subjective Patient reports she had a fall about a year ago and she landed on both knees, she tripped while walking in a building when wearing flip flops. Currently the left knee bothers her when she has been sitting with knee bent extended periods. She does note some bilateral knee pain when squatting or stairs, but that is not as bad. Also reports grinding under the knee cap when bending/straightening knee.    Limitations Sitting;House hold activities    How long can you sit comfortably? 20 minutes    How long can you stand comfortably? No limitation    How long can you walk comfortably? No  limitation    Patient Stated Goals Improve ability to sit longer periods with less pain    Currently in Pain? Yes    Pain Score 0-No pain   at worse 8/10 pain   Pain Location Knee    Pain Orientation Left    Pain Descriptors / Indicators Tightness;Nagging;Aching    Pain Type Chronic pain    Pain Radiating Towards radiates up medial quad region    Pain Onset More than a month ago    Pain Frequency Intermittent    Aggravating Factors  Sitting with knee bent extended periods    Pain Relieving Factors Getting out of bent knee position, massage, medication              [facility] PT Assessment -  11/20/20 0001      Assessment   Medical Diagnosis Left knee pain    Referring Provider (PT) Denita Lung, MD    Onset Date/Surgical Date --   approximately 1 year ago   Next MD Visit 12/13/2020    Prior Therapy Yes - right hip      Precautions   Precautions None      Restrictions   Weight Bearing Restrictions No      Balance Screen   Has the patient fallen in the past 6 months No   patient reports one fall approximately one year ago   Has the patient had a decrease in activity level because of a fear of falling?  No    Is the patient reluctant to leave their home because of a fear of falling?  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    Leisure None reported      Cognition   Overall Cognitive Status Within Functional Limits for tasks assessed      Observation/Other Assessments   Observations Patient appears in no apparent distress    Focus on Therapeutic Outcomes (FOTO)  79% functional status      Sensation   Light Touch Appears Intact      Coordination   Gross Motor Movements are Fluid and Coordinated Yes      Functional Tests   Functional tests Squat      Squat   Comments Patient exhibits bilateral toe out and knee vlagus, weight shift toward right, knee dominant form with poor control lowering      ROM / Strength   AROM / PROM / Strength AROM;Strength      AROM   Overall AROM Comments Knee AROM grossly WFL and equal bilaterally, non painful with overpressure    AROM Assessment Site Knee    Right/Left Knee Right;Left      Strength   Strength Assessment Site Hip;Knee    Right/Left Hip Right;Left    Right Hip Flexion 4/5    Right Hip Extension 3+/5    Right Hip ABduction 3+/5    Left Hip  Flexion 4/5    Left Hip Extension 3+/5    Left Hip ABduction 3+/5    Right/Left Knee Right;Left    Right Knee Flexion 5/5    Right Knee Extension 4+/5    Left Knee  Flexion 5/5    Left Knee Extension 4+/5   patient reported pressure in front of knee     Flexibility   Soft Tissue Assessment /Muscle Length yes    Hamstrings grossly WFL    Quadriceps Limited bilaterally ~ 70 deg prone knee bend      Palpation   Patella mobility Left slightly more limited than right    Palpation comment Mild TTP to medial distal quad region      Special Tests   Other special tests All meniscal testing negative      Transfers   Transfers Independent with all Transfers      Ambulation/Gait   Ambulation/Gait Yes    Ambulation/Gait Assistance 7: Independent    Gait Comments Bilateral toe out, bilateral knee valgus (L > R)                      Objective measurements completed on examination: See above findings.       [place] Adult PT Treatment/Exercise - 11/20/20 0001      Exercises   Exercises Knee/Hip      Knee/Hip Exercises: Stretches   Quad Stretch 2 reps;30 seconds    Quad Stretch Limitations prone with strap      Knee/Hip Exercises: Standing   Functional Squat 10 reps   cued for eccentric control, proper knee position   Functional Squat Limitations goblet squat with 15# kettlebell      Knee/Hip Exercises: Supine   Bridges 10 reps   3 sec hold   Bridges Limitations red band around knees    Straight Leg Raises 10 reps      Knee/Hip Exercises: Sidelying   Clams x 15 with red band                  PT Education - 11/20/20 1020    Education Details Exam findings, POC, HEP    Person(s) Educated Patient    Methods Explanation;Demonstration;Tactile cues;Verbal cues;Handout    Comprehension Verbalized understanding;Returned demonstration;Verbal cues required;Tactile cues required;Need further instruction            PT Short Term Goals - 11/20/20 1021      PT  SHORT TERM GOAL #1   Title Patient will be I with initial HEP to progress with PT    Baseline provided at eval    Time 4    Period Weeks    Status New    Target Date 12/18/20      PT SHORT TERM GOAL #2   Title PT will review FOTO with patient by 3rd visit    Baseline intake at eval    Time 3    Period Weeks    Status New    Target Date 12/11/20      PT SHORT TERM GOAL #3   Title Patient will report </= 5/10 pain when sitting >20 minutes to improve ability to ride in car    Baseline pain can be 8/10 when sitting > 20 minutes    Time 4    Period Weeks    Status New    Target Date 12/18/20             PT Long Term Goals - 11/20/20 1022      PT LONG TERM GOAL #1   Title Patient will be I with final HEP to maintain progress from PT    Baseline provided at eval    Time 8    Period Weeks  Status New    Target Date 01/15/21      PT LONG TERM GOAL #2   Title Patient will report >/= 80% functional status on FOTO to indicate improved functional ability    Baseline 79%    Time 8    Period Weeks    Status New    Target Date 01/15/21      PT LONG TERM GOAL #3   Title Patient will be able to sit >/= 40 minutes without need to get up and stretch to improve ability to sit at dinner or games    Baseline has to get up and stretch by 20 minutes of sitting    Time 8    Period Weeks    Status New    Target Date 01/15/21      PT LONG TERM GOAL #4   Title Patient will demonstrate 5/5 MMT for bilateral knee extension to reduce pain with stairs and squatting    Baseline 4+/5 MMT bilateral knee extension    Time 8    Period Weeks    Status New    Target Date 01/15/21      PT LONG TERM GOAL #5   Title Patient will exhibit >/= 4/5 MMT for bilateral hip abduction and extension to improve knee control and reduce pain with acitvity    Baseline grossly 3+/5 MMT for bilateral hip abduction and extension    Time 8    Period Weeks    Status New    Target Date 01/15/21                   Plan - 11/20/20 1114    Clinical Impression Statement Patient presents to PT with report of chronic bilateral knee pain, left worse than right, mainly with sitting while knee is flexed for extended periods. Patient's symptoms seem most consistent with patellofemoral pain. She exhibits flexibility limitation of quads, strength deficit of quads and hips, impaired squat form, limitation of patellar mobility, and increased knee anterior medial knee pain with activities such as sitting, squatting, and stairs. She was provided exercises to initate stretching and strengthening, and patient would benefit from continued skilled PT to progress flexibility and strength in order to reduce pain, improve sitting ability, and maximize functional mobility.    Personal Factors and Comorbidities Fitness;Past/Current Experience;Time since onset of injury/illness/exacerbation    Examination-Activity Limitations Sit;Squat;Stairs    Examination-Participation Restrictions Cleaning;Driving    Stability/Clinical Decision Making Stable/Uncomplicated    Clinical Decision Making Low    Rehab Potential Good    PT Frequency Biweekly    PT Duration 8 weeks    PT Treatment/Interventions ADLs/Self Care Home Management;Aquatic Therapy;Cryotherapy;Electrical Stimulation;Iontophoresis 52m/ml Dexamethasone;Moist Heat;Neuromuscular re-education;Balance training;Therapeutic exercise;Therapeutic activities;Functional mobility training;Stair training;Gait training;Patient/family education;Manual techniques;Dry needling;Passive range of motion;Taping;Vasopneumatic Device;Spinal Manipulations;Joint Manipulations    PT Next Visit Plan Review HEP and progress PRN, manual for patellar mobility and quad flexibility, quad stretch, progress quad and hip strength (squats, step-ups, etc)    PT Home Exercise Plan [email or web address]    Consulted and Agree with Plan of Care Patient           Patient will benefit from skilled therapeutic  intervention in order to improve the following deficits and impairments:  Abnormal gait,Decreased activity tolerance,Pain,Impaired flexibility,Improper body mechanics,Decreased strength  Visit Diagnosis: Chronic pain of left knee  Chronic pain of right knee  Muscle weakness (generalized)     Problem List Patient Active Problem List   Diagnosis Date Noted  . CIN  I (cervical intraepithelial neoplasia I) 11/07/2020  . Solitary pulmonary nodule on lung CT 08/02/2020  . ASCUS with positive high risk HPV cervical 04/25/2020  . Reactive airway disease 01/19/2020  . Weight gain 01/19/2020  . Wheezing 01/19/2020  . Rash 01/19/2020  . Angular cheilosis 01/19/2020  . Screening for heart disease 08/17/2019  . Chronic constipation 06/28/2019  . Flatulence 06/28/2019  . Edema 06/28/2019  . Gastroesophageal reflux disease 06/28/2019  . Allergic rhinitis due to pollen 06/28/2019  . Anemia 08/24/2018  . Cough 08/24/2018  . Sinus pressure 04/03/2018  . Sleep disturbance 04/03/2018  . Acute right-sided back pain 04/03/2018  . Polyuria 04/03/2018  . Impaired fasting blood sugar 04/03/2018  . History of tubal ligation 04/03/2018  . Abdominal discomfort 01/28/2018  . Frequency of urination 01/28/2018  . Increased risk of breast cancer   . BRCA negative   . BV (bacterial vaginosis) 10/15/2017  . Hip pain, right 04/23/2017  . PVC (premature ventricular contraction) 03/05/2017  . Family history of premature CAD 02/06/2017  . Blurred vision 02/06/2017  . Elevated blood-pressure reading without diagnosis of hypertension 02/06/2017  . Heart palpitations 02/06/2017  . Headache syndrome 02/06/2017  . Other fatigue 02/06/2017  . Daytime somnolence 02/06/2017  . Family history of breast cancer 11/25/2016  . Upper airway cough syndrome 09/04/2016  . DOE (dyspnea on exertion) 09/04/2016  . Female sterility 09/14/2015  . Menorrhagia with regular cycle 09/14/2015  . Uterine leiomyoma 09/14/2015   . Ovarian cyst, right 09/14/2015  . Vulvar lesion 09/14/2015  . Chronic vaginitis 09/14/2015  . Screening for hematuria or proteinuria 06/28/2015  . Obesity with serious comorbidity 06/28/2015  . Vitamin D deficiency 06/28/2015  . Anemia, iron deficiency 06/28/2015  . Microscopic hematuria 06/28/2015  . Allergic rhinitis 03/31/2013    Hilda Blades, PT, DPT, LAT, ATC 11/20/20  11:23 AM Phone: [phone] Fax: [facility] [address] [place], [place], [address] Phone: [phone]   Fax:  [phone]  Name: Kristy [name] MRN: [id number] Date of Birth: 1971-06-30

## 2020-11-20 NOTE — Patient Instructions (Signed)
Access Code: [email or web address] URL: [email or web address] Date: 11/20/2020 Prepared by: Hilda Blades  Exercises Prone Quadriceps Stretch with Strap - 2 x daily - 7 x weekly - 3 reps - 30 hold Clamshell with Resistance - 1 x daily - 7 x weekly - 2 sets - 15 reps Supine Bridge with Resistance Band - 1 x daily - 7 x weekly - 2 sets - 10 reps - 3 hold Active Straight Leg Raise with Quad Set - 1 x daily - 7 x weekly - 2 sets - 10 reps Goblet Squat with Kettlebell - 1 x daily - 7 x weekly - 3 sets - 8 reps

## 2020-11-24 ENCOUNTER — Other Ambulatory Visit: Payer: Self-pay | Admitting: Family Medicine

## 2020-11-24 DIAGNOSIS — R0789 Other chest pain: Secondary | ICD-10-CM

## 2020-11-24 DIAGNOSIS — R062 Wheezing: Secondary | ICD-10-CM

## 2020-11-24 NOTE — Telephone Encounter (Signed)
Pt does not need an a refill on this

## 2020-12-04 ENCOUNTER — Encounter: Payer: Self-pay | Admitting: Physical Therapy

## 2020-12-04 ENCOUNTER — Ambulatory Visit: Payer: 59 | Admitting: Physical Therapy

## 2020-12-04 ENCOUNTER — Other Ambulatory Visit: Payer: Self-pay

## 2020-12-04 DIAGNOSIS — G8929 Other chronic pain: Secondary | ICD-10-CM

## 2020-12-04 DIAGNOSIS — M25562 Pain in left knee: Secondary | ICD-10-CM

## 2020-12-04 DIAGNOSIS — M6281 Muscle weakness (generalized): Secondary | ICD-10-CM

## 2020-12-04 NOTE — Therapy (Addendum)
[place], [place], [address] Phone: [phone]   Fax:  [phone]  Physical Therapy Treatment / Discharge  Patient Details  Name: Kristy [name] MRN: [id number] Date of Birth: 1971/03/09 Referring Provider (PT): Denita Lung, MD   Encounter Date: 12/04/2020   PT End of Session - 12/04/20 1015     Visit Number 2    Number of Visits 4    Date for PT Re-Evaluation 01/15/21    Authorization Type [employer]    Authorization Time Period FOTO by 6th visit    PT Start Time 1011   patient arrived late   PT Stop Time 1045    PT Time Calculation (min) 34 min    Activity Tolerance Patient tolerated treatment well    Behavior During Therapy [facility] for tasks assessed/performed             Past Medical History:  Diagnosis Date   Allergy    BRCA negative 11/2012, 4/19   BRCA neg 2014; MyRisk update neg except POLE VUS 4/19   Dysrhythmia    a. 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 pylori infection 10/2010   H/O exercise stress test 2018   Increased risk of breast cancer 11/2012; 4/19   5/14 IBIS=25.6%; 4/19 IBIS=25.8%   Iron deficiency anemia, unspecified    history of, normal hemoglobin 06/2019   Obesity    Seasonal allergies    ragweed, tree pollen, mild and dust per testing 08/2012   Unspecified vitamin D deficiency    Vitamin D deficiency 2017    Past Surgical History:  Procedure Laterality Date   BREAST BIOPSY Right 1980s   neg   [place] N/A 09/14/2015   Procedure: DILATATION & CURETTAGE/HYSTEROSCOPY WITH NOVASURE ABLATION;  Surgeon: Gae Dry, MD;  Location: [facility] OR[facility];  Service: Gynecology;  Laterality: N/A;   IUD REMOVAL N/A 09/14/2015   Procedure: INTRAUTERINE DEVICE (IUD) REMOVAL;  Surgeon:  Gae Dry, MD;  Location: [facility];  Service: Gynecology;  Laterality: N/A;   LAPAROSCOPIC BILATERAL SALPINGECTOMY Bilateral 09/14/2015   Procedure: LAPAROSCOPIC BILATERAL SALPINGECTOMY;  Surgeon: Gae Dry, MD;  Location: [facility];  Service: Gynecology;  Laterality: Bilateral;   TUBAL LIGATION Bilateral 09/14/2015   Procedure: BILATERAL TUBAL LIGATION;  Surgeon: Gae Dry, MD;  Location: [facility];  Service: Gynecology;  Laterality: Bilateral;   VULVAR LESION REMOVAL N/A 09/14/2015   Procedure: VULVAR LESION;  Surgeon: Gae Dry, MD;  Location: [facility];  Service: Gynecology;  Laterality: N/A;   WISDOM TOOTH EXTRACTION      There were no vitals filed for this visit.   Subjective Assessment - 12/04/20 1012     Subjective Patient reports she is doing well, exercises are going good and she is not having any trouble with them. States she had a little inflammation this morning but once she started moving around it felt ok. She has not performed her squat exercises at home, she is still a little nervous with squatting.    Patient Stated Goals Improve ability to sit longer periods with less pain    Currently in Pain? No/denies                [facility] PT Assessment - 12/04/20 0001  Functional Tests   Functional tests Squat      Squat   Comments Patient demonstrates improved hip hinge technique, mild knee valgus                           [facility] Adult PT Treatment/Exercise - 12/04/20 0001       Exercises   Exercises Knee/Hip      Knee/Hip Exercises: Aerobic   Recumbent Bike L2 x 3 min while taking subjective      Knee/Hip Exercises: Machines for Strengthening   Total Gym Leg Press DL 65# 3 x 8      Knee/Hip Exercises: Standing   Forward Step Up 2 sets;10 reps    Forward Step Up Limitations 8" step height    Functional Squat Limitations Goblet squat with 15# to chair 3 x 8    Other Standing Knee Exercises Lateral band walk with green around  knees 2 x 20      Knee/Hip Exercises: Supine   Bridges 10 reps   2 sets   Bridges Limitations green band around knees    Straight Leg Raises 10 reps      Knee/Hip Exercises: Sidelying   Clams 2 x 10 with green                    PT Education - 12/04/20 1015     Education Details HEP update, gym machine exercises    Person(s) Educated Patient    Methods Explanation;Demonstration;Verbal cues;Handout    Comprehension Verbalized understanding;Returned demonstration;Verbal cues required;Need further instruction              PT Short Term Goals - 12/04/20 1327       PT SHORT TERM GOAL #1   Title Patient will be I with initial HEP to progress with PT    Baseline provided at eval    Time 4    Period Weeks    Status New    Target Date 12/18/20      PT SHORT TERM GOAL #2   Title PT will review FOTO with patient by 3rd visit    Baseline reviewed at 2nd visit - 12/04/20    Time 3    Period Weeks    Status Achieved    Target Date 12/11/20      PT SHORT TERM GOAL #3   Title Patient will report </= 5/10 pain when sitting >20 minutes to improve ability to ride in car    Baseline pain can be 8/10 when sitting > 20 minutes    Time 4    Period Weeks    Status New    Target Date 12/18/20               PT Long Term Goals - 11/20/20 1022       PT LONG TERM GOAL #1   Title Patient will be I with final HEP to maintain progress from PT    Baseline provided at eval    Time 8    Period Weeks    Status New    Target Date 01/15/21      PT LONG TERM GOAL #2   Title Patient will report >/= 80% functional status on FOTO to indicate improved functional ability    Baseline 79%    Time 8    Period Weeks    Status New    Target Date 01/15/21      PT LONG  TERM GOAL #3   Title Patient will be able to sit >/= 40 minutes without need to get up and stretch to improve ability to sit at dinner or games    Baseline has to get up and stretch by 20 minutes of sitting     Time 8    Period Weeks    Status New    Target Date 01/15/21      PT LONG TERM GOAL #4   Title Patient will demonstrate 5/5 MMT for bilateral knee extension to reduce pain with stairs and squatting    Baseline 4+/5 MMT bilateral knee extension    Time 8    Period Weeks    Status New    Target Date 01/15/21      PT LONG TERM GOAL #5   Title Patient will exhibit >/= 4/5 MMT for bilateral hip abduction and extension to improve knee control and reduce pain with acitvity    Baseline grossly 3+/5 MMT for bilateral hip abduction and extension    Time 8    Period Weeks    Status New    Target Date 01/15/21                   Plan - 12/04/20 1015     Clinical Impression Statement Patient tolerated therapy well with no adverse effects. Therapy limited due to patient arriving late. Therapy focus on progressing closed chain strengthening with good tolerance. Patient had not been performing squat exercise so reviewed this and encouraged patient to work on this at home, or she could perform leg press exercise at gym. [employer] step-up with visit and patient required cueing to perform exercise in a controlled manner. She did not report any increased knee pain this visit but did report feeling muscles burning consistent with exercise. Patient would benefit from continued skilled PT to progress flexibility and strength in order to reduce pain, improve sitting ability, and maximize functional mobility.    PT Treatment/Interventions ADLs/Self Care Home Management;Aquatic Therapy;Cryotherapy;Electrical Stimulation;Iontophoresis 44m/ml Dexamethasone;Moist Heat;Neuromuscular re-education;Balance training;Therapeutic exercise;Therapeutic activities;Functional mobility training;Stair training;Gait training;Patient/family education;Manual techniques;Dry needling;Passive range of motion;Taping;Vasopneumatic Device;Spinal Manipulations;Joint Manipulations    PT Next Visit Plan Review HEP and progress PRN,  manual for patellar mobility and quad flexibility, quad stretch, progress quad and hip strength (squats, step-ups, etc)    PT Home Exercise Plan [email or web address]    Consulted and Agree with Plan of Care Patient             Patient will benefit from skilled therapeutic intervention in order to improve the following deficits and impairments:  Abnormal gait,Decreased activity tolerance,Pain,Impaired flexibility,Improper body mechanics,Decreased strength  Visit Diagnosis: Chronic pain of left knee  Chronic pain of right knee  Muscle weakness (generalized)     Problem List Patient Active Problem List   Diagnosis Date Noted   CIN I (cervical intraepithelial neoplasia I) 11/07/2020   Solitary pulmonary nodule on lung CT 08/02/2020   ASCUS with positive high risk HPV cervical 04/25/2020   Reactive airway disease 01/19/2020   Weight gain 01/19/2020   Wheezing 01/19/2020   Rash 01/19/2020   Angular cheilosis 01/19/2020   Screening for heart disease 08/17/2019   Chronic constipation 06/28/2019   Flatulence 06/28/2019   Edema 06/28/2019   Gastroesophageal reflux disease 06/28/2019   Allergic rhinitis due to pollen 06/28/2019   Anemia 08/24/2018   Cough 08/24/2018   Sinus pressure 04/03/2018   Sleep disturbance 04/03/2018   Acute right-sided back pain 04/03/2018   Polyuria 04/03/2018  Impaired fasting blood sugar 04/03/2018   History of tubal ligation 04/03/2018   Abdominal discomfort 01/28/2018   Frequency of urination 01/28/2018   Increased risk of breast cancer    BRCA negative    BV (bacterial vaginosis) 10/15/2017   Hip pain, right 04/23/2017   PVC (premature ventricular contraction) 03/05/2017   Family history of premature CAD 02/06/2017   Blurred vision 02/06/2017   Elevated blood-pressure reading without diagnosis of hypertension 02/06/2017   Heart palpitations 02/06/2017   Headache syndrome 02/06/2017   Other fatigue 02/06/2017   Daytime somnolence 02/06/2017    Family history of breast cancer 11/25/2016   Upper airway cough syndrome 09/04/2016   DOE (dyspnea on exertion) 09/04/2016   Female sterility 09/14/2015   Menorrhagia with regular cycle 09/14/2015   Uterine leiomyoma 09/14/2015   Ovarian cyst, right 09/14/2015   Vulvar lesion 09/14/2015   Chronic vaginitis 09/14/2015   Screening for hematuria or proteinuria 06/28/2015   Obesity with serious comorbidity 06/28/2015   Vitamin D deficiency 06/28/2015   Anemia, iron deficiency 06/28/2015   Microscopic hematuria 06/28/2015   Allergic rhinitis 03/31/2013    Hilda Blades, PT, DPT, LAT, ATC 12/04/20  1:29 PM Phone: [phone] Fax: [facility] [address] [place], [place], [address] Phone: [phone]   Fax:  [phone]  Name: Kristy [name] MRN: [id number] Date of Birth: [birth year]   PHYSICAL THERAPY DISCHARGE SUMMARY  Visits from Start of Care: 2  Current functional level related to goals / functional outcomes: See above   Remaining deficits: See above   Education / Equipment: HEP   Patient agrees to discharge. Patient goals were not met. Patient is being discharged due to not returning since the last visit.

## 2020-12-04 NOTE — Patient Instructions (Signed)
Access Code: [email or web address] URL: [email or web address] Date: 12/04/2020 Prepared by: Hilda Blades  Exercises Prone Quadriceps Stretch with Strap - 2 x daily - 7 x weekly - 3 reps - 30 hold Clamshell with Resistance - 1 x daily - 7 x weekly - 2 sets - 15 reps Supine Bridge with Resistance Band - 1 x daily - 7 x weekly - 2 sets - 10 reps - 3 hold Active Straight Leg Raise with Quad Set - 1 x daily - 7 x weekly - 2 sets - 10 reps Goblet Squat with Kettlebell - 1 x daily - 3-4 x weekly - 3 sets - 8 reps Step Up - 1 x daily - 3-4 x weekly - 3 sets - 10 reps Side Stepping with Resistance at Thighs - 1 x daily - 3-4 x weekly - 3 sets - 20 reps

## 2020-12-05 ENCOUNTER — Encounter: Payer: Self-pay | Admitting: Internal Medicine

## 2020-12-05 ENCOUNTER — Ambulatory Visit (INDEPENDENT_AMBULATORY_CARE_PROVIDER_SITE_OTHER): Payer: 59 | Admitting: Internal Medicine

## 2020-12-05 DIAGNOSIS — J309 Allergic rhinitis, unspecified: Secondary | ICD-10-CM | POA: Diagnosis not present

## 2020-12-05 DIAGNOSIS — R058 Other specified cough: Secondary | ICD-10-CM

## 2020-12-05 NOTE — Progress Notes (Signed)
Subjective:    Patient ID: Kristy [name], female   DOB: Jun 18, 1971    MRN: [id number]    Brief patient profile:  89 yobf never smoker works as Interior and spatial designer  with sinus issues around ? 2000 esp in fall better while she could get Tavist then 2016 eval by allergy  ? [facility] office pos dust/ ragweed didn't rec any shots and no f/u then variable sob assoc sense of congestion referred to pulmonary clinic 09/04/2016 by Boone Master.   History of Present Illness  09/04/2016 1st [place] [facility] office visit/ Kristy [name]   Chief Complaint  Patient presents with  . Pulmonary Consult    Referred by Dr. Aleen Campi.  Pt c/o SOB for the past several months. She states also having some chest congestion. She has had cough x 2 wks- non prod and sometimes to the point of vomitting. Sometimes cough wakes her up at night.  She states she gets completely out of breath just walking one flight of stairs.   indolent onset 12/2015 of chest "congestion" and need to clear throat  present every day since onset onset   and unable to consistently lie flat due to choking sensation assoc hoarseness and dysphagia/  but s excess/ purulent sputum or mucus plugs. Cough so bad vomits / worse with albuterol  Better since started prilosec but not taking consistently Last pred x 6 days not recently ? Helped some  rec Start singulair 10 mg every evening  Change Omeprazole 40 mg   Take  30-60 min before first meal of the day and Pepcid (famotidine)  20 mg one @  bedtime until return to office - this is the best way to tell whether stomach acid is contributing to your problem.  GERD  Diet    10/02/2016  f/u ov/Kristy [name] re: cough since 12/2015 finally better c/w UACS  Chief Complaint  Patient presents with  . Follow-up    cough is much better  maint on ppi/h2hs and singulair with min daytime > noct cough and no sob rec Stay on the same medication x 2 months then try off prilosec but take pepcid 20 mg after bfast and supper an if your symptoms  flare off prilosec then you need to see a GI and I will be happy to refer GERD diet   Please schedule a follow up visit in 3 months but call sooner if needed > did not return    08/02/2020  Re-establish ov/Kristy [name] re:  Sob > cough >>  was taking prn  gerd rx but 3 m prior to OV  Resumed daily prilosec at bedtime and sense of nasal/chest congestion some better on arnuity but sense of  Congestion persists  Chief Complaint  Patient presents with  . Shortness of Breath    Intermittent dyspnea improved with inhaler prescribed by PCP recently  Dyspnea: steps are a problem, long walks at fast pace also cause sob some better on arnuity =breo same response Cough: lots of nasal congestion/ green mucus in ams x years  Sleeping: does fine p zyrtec hs flat bed  SABA use: none  02: no rec Singulair 10 mg every evening with Pepcid 20 mg - both after supper. Prilosec 40 mg Take 30-60 min before first meal of the day  Stop inhalers for now  Prednisone 10 mg take  4 each am x 2 days,   2 each am x 2 days,  1 each am x 2 days and stop  Please schedule a follow up office visit  in 4 weeks, sooner if needed  with all medications /inhalers/ solutions in hand so we can verify exactly what you are taking. This includes all medications from all doctors and over the counters   08/29/2020  f/u ov/Kristy [name] re: uacs/ better off all inhalers  Chief Complaint  Patient presents with  . Follow-up    Breathing is doing well and she states has very little cough- occ prod with clear sputum.    Dyspnea:  Better despite no inhalers  Cough: typically around 10 mg p 7 pm supper - worse p spicy foods  Sleeping: not typically waking her up  SABA use: none 02: none  Covid status:   vax 3  rec Prilosec should be 30 min before you eat but should be taken at least a few weeks to see any benefit (take before supper since symptoms seem worse after supper. For drainage / throat tickle try take CHLORPHENIRAMINE  4 mg  Pulmonary follow up  is as needed     11/07/2020  f/u ov/Kristy [name] re:  Onset 10/28/20  15 min p dog exposure in [place] > eyes watering /nasal drainage and stayed in same house exp thru 10/30/20 but still coughing to point of vomiting worst 11/04/20 > seen 4/21 by  PCP rx albuterol / no longer  Chief Complaint  Patient presents with  . Follow-up    Had reaction 2 wks. Ago, sob-better now, cough lt. Yellow, coughing so hard 4 days ago vomited, no fcs, occass. Wheezing, pnd  . Acute Visit  Dyspnea:  Mostly with coughing  Cough: slt yellow/ worse p saba  Sleeping: 45 degrees otherwise coughs SABA use: once or twice daily  02: none  Covid status:   vax 3 pfizer  rec At onset of any cough make sure  Prilosec Take 30- 60 min before your first and last meals of the day  For drainage / throat tickle /sneezing instead  Of zyrtec try take CHLORPHENIRAMINE  4 mg   Continue singulair/ pepcid daily  Prednisone 10 mg take  4 each am x 2 days,   2 each am x 2 days,  1 each am x 2 days and stop  For cough > hycodan 1-2 tsp every 4 hours as needed GERD diet    12/05/2020  f/u ov/Kristy [name] re: allergic rhinitis/ recurrent cough  Chief Complaint  Patient presents with  . Follow-up    Cough has resolved. She has some nasal congestion with yellow nasal d/c. She has not had to use her albuterol inhaler in the past 2 wks.   Dyspnea:  Not limited by breathing from desired activities   Cough: minimal assoc with nasal drainage /flonase  Sleeping: able to lie flat SABA use: none  02: none      No obvious day to day or daytime variability or assoc  mucus plugs or hemoptysis or cp or chest tightness, subjective wheeze or overt  hb symptoms.   slepeping ow  without nocturnal  or early am exacerbation  of respiratory  c/o's or need for noct saba. Also denies any obvious fluctuation of symptoms with weather or environmental changes or other aggravating or alleviating factors except as outlined above   No unusual exposure hx or h/o childhood  pna/ asthma or knowledge of premature birth.  Current Allergies, Complete Past Medical History, Past Surgical History, Family History, and Social History were reviewed in [employer] record.  ROS  The following are not active complaints unless bolded Hoarseness, sore  throat, dysphagia, dental problems, itching, sneezing,  nasal congestion or discharge of excess mucus or purulent secretions, ear ache,   fever, chills, sweats, unintended wt loss or wt gain, classically pleuritic or exertional cp,  orthopnea pnd or arm/hand swelling  or leg swelling, presyncope, palpitations, abdominal pain, anorexia, nausea, vomiting, diarrhea  or change in bowel habits or change in bladder habits, change in stools or change in urine, dysuria, hematuria,  rash, arthralgias, visual complaints, headache, numbness, weakness or ataxia or problems with walking or coordination,  change in mood or  memory.        Current Meds  Medication Sig  . albuterol (VENTOLIN HFA) 108 (90 Base) MCG/ACT inhaler Inhale 2 puffs into the lungs every 6 (six) hours as needed for wheezing or shortness of breath.  . cetirizine (ZYRTEC) 10 MG tablet Take 1 tablet (10 mg total) by mouth daily.  . clotrimazole-betamethasone (LOTRISONE) cream Apply 1 application topically 2 (two) times daily.  . famotidine (PEPCID) 20 MG tablet One after supper  . montelukast (SINGULAIR) 10 MG tablet Take 1 tablet (10 mg total) by mouth at bedtime.  Marland Kitchen omeprazole (PRILOSEC) 40 MG capsule TAKE 1 CAPSULE BY MOUTH EVERY DAY (Patient taking differently: Take 40 mg by mouth as needed. TAKE 1 CAPSULE BY MOUTH EVERY DAY)   Current Facility-Administered Medications for the 12/05/20 encounter (Office Visit) with Nyoka Cowden, MD  Medication  . 0.9 %  sodium chloride infusion                        Objective:   Physical Exam    12/05/2020       221  11/07/2020       222 08/29/2020       219  08/02/2020       217  10/02/2016      213    09/04/16 209 lb (94.8 kg)  08/27/16 216 lb (98 kg)  08/20/16 214 lb 9.6 oz (97.3 kg)        Vital signs reviewed  12/05/2020  - Note at rest 02 sats  100% on RA   General appearance:    amb pleasant bf nad      HEENT : pt wearing mask not removed for exam due to covid -19 concerns.    NECK :  without JVD/Nodes/TM/ nl carotid upstrokes bilaterally   LUNGS: no acc muscle use,  Nl contour chest which is clear to A and P bilaterally without cough on insp or exp maneuvers   CV:  RRR  no s3 or murmur or increase in P2, and no edema   ABD:  soft and nontender with nl inspiratory excursion in the supine position. No bruits or organomegaly appreciated, bowel sounds nl  MS:  Nl gait/ ext warm without deformities, calf tenderness, cyanosis or clubbing No obvious joint restrictions   SKIN: warm and dry without lesions    NEURO:  alert, approp, nl sensorium with  no motor or cerebellar deficits apparent.             Assessment:

## 2020-12-05 NOTE — Patient Instructions (Addendum)
No change in medications for now  Continue flonase one twice daily - point it toward the ear on the same side   I will be referring you to [facility]   Pulmonary follow up is as needed

## 2020-12-05 NOTE — Assessment & Plan Note (Signed)
Onset around 2000 - [facility] eval 2016 did not rec shots for dust/ ragweed allergy identified  -Trial of max gerd rx/ singulair 09/04/2016 >> improved 10/02/2016 > changed to prn gerd rx and ok until 04/2021 with sob / "congestion"  - placed back on daily singulair, ppi ac in and pepcid 20 in pm and d/c all inhalers  - Allergy profile 08/02/2020 >  Eos 0.1 /  IgE  2 - sinus CT 08/08/2020  No evidence of inflammatory sinus disease. - Flare mid April 6825 p dog exp > cyclical cough rx / no inhalers > resolved as of 12/05/2020   No evidence for asthma here > focus should be on treating underlying rhinitis (see separate a/p)

## 2020-12-05 NOTE — Assessment & Plan Note (Signed)
Singulair trial  09/04/2016 >>> - Sinus CT 08/08/2020  No evidence of inflammatory sinus disease. - 12/05/2020 referred back to Dr Neldon Mc  Doubt singulair helping but ok to use plus flonase  pending allergy eval requested   - The proper method of use, as well as anticipated side effects, of nasal steroids were discussed and demonstrated to the patient using teach back method.          Each maintenance medication was reviewed in detail including emphasizing most importantly the difference between maintenance and prns and under what circumstances the prns are to be triggered using an action plan format where appropriate.  Total time for H and P, chart review, counseling, reviewing nasal  device(s) and generating customized AVS unique to this office visit / same day charting = 15 min

## 2020-12-13 ENCOUNTER — Encounter: Payer: Self-pay | Admitting: Family Medicine

## 2020-12-13 ENCOUNTER — Ambulatory Visit (INDEPENDENT_AMBULATORY_CARE_PROVIDER_SITE_OTHER): Payer: 59 | Admitting: Family Medicine

## 2020-12-13 ENCOUNTER — Other Ambulatory Visit: Payer: Self-pay

## 2020-12-13 VITALS — BP 126/86 | HR 72 | Temp 98.8°F | Wt 223.2 lb

## 2020-12-13 DIAGNOSIS — Z6837 Body mass index (BMI) 37.0-37.9, adult: Secondary | ICD-10-CM | POA: Diagnosis not present

## 2020-12-13 DIAGNOSIS — M2242 Chondromalacia patellae, left knee: Secondary | ICD-10-CM

## 2020-12-13 NOTE — Progress Notes (Signed)
   Subjective:    Patient ID: Georgiana Shore, female    DOB: 26-Apr-1971, 50 y.o.   MRN: [id number]  HPI She is here for recheck on her knee.  She has been involved in physical therapy for the last month and states that she is now roughly 50% better. She also is interested in weight loss.   Review of Systems     Objective:   Physical Exam Exam of left knee shows no pain on palpation.  Negative compression testing.  Negative anterior drawer.  Medial and lateral collateral ligaments intact.  McMurray's testing negative.       Assessment & Plan:  Chondromalacia, patella, left  Class 2 severe obesity with serious comorbidity and body mass index (BMI) of 37.0 to 37.9 in adult, unspecified obesity type ([place]) - Plan: Amb Ref to Medical Weight Management Encouraged her to continue with the physical therapy for her knee. I then discussed in detail various options concerning weight loss.  Discussed low carbohydrate plans including the keto diet, Mediterranean and [employer].  She is interested in medication for this.  Explained the best way to handle that would be to go to medical weight loss.  Referral was made. Greater than 30 minutes spent in coordination of care and counseling as well as examination.

## 2020-12-18 ENCOUNTER — Ambulatory Visit: Payer: 59 | Admitting: Physical Therapy

## 2020-12-28 ENCOUNTER — Other Ambulatory Visit: Payer: Self-pay

## 2020-12-28 ENCOUNTER — Ambulatory Visit
Admission: EM | Admit: 2020-12-28 | Discharge: 2020-12-28 | Disposition: A | Discharge status: Home / Self Care | Payer: 59 | Attending: Emergency Medicine | Admitting: Emergency Medicine

## 2020-12-28 ENCOUNTER — Encounter: Payer: Self-pay | Admitting: Emergency Medicine

## 2020-12-28 DIAGNOSIS — Z20822 Contact with and (suspected) exposure to covid-19: Secondary | ICD-10-CM | POA: Diagnosis not present

## 2020-12-28 DIAGNOSIS — R509 Fever, unspecified: Secondary | ICD-10-CM | POA: Diagnosis not present

## 2020-12-28 MED ORDER — ACETAMINOPHEN 325 MG PO TABS
650.0000 mg | ORAL_TABLET | Freq: Once | ORAL | Status: AC
  Administered 2020-12-28: 650 mg via ORAL

## 2020-12-28 NOTE — Discharge Instructions (Addendum)
Your symptoms could be consistent with COVID or flu.  I recommend that you isolate at home until you are test result.  If your results are positive, we can call you in a prescription for an antiviral if needed.  Please follow-up with your doctor, return to the urgent care, or go to the emergency department if your symptoms change or worsen.

## 2020-12-28 NOTE — ED Triage Notes (Signed)
Pt here for fever and body aches with HA starting yesterday

## 2020-12-28 NOTE — ED Provider Notes (Signed)
[facility]    CSN: [id number] Arrival date & time: 12/28/20  1104      History   Chief Complaint Chief Complaint  Patient presents with   Fever    HPI Kristy [name] is a 50 y.o. female.   Patient presents to the emergency department with a chief complaint of fever, body aches, and headache.  She states symptoms started yesterday.  She works as a Theme park manager.  She denies known sick contacts.  She reports that she has had slight cough.  She states that this is considered baseline for her.  She has tried taking Tylenol for symptoms.  She denies any nausea, vomiting, or diarrhea.  Denies any dysuria, hematuria, or unusual vaginal discharge or bleeding.  She denies any other associated symptoms.  Nothing makes the symptoms better or worse.  The history is provided by the patient. No language interpreter was used.   Past Medical History:  Diagnosis Date   Allergy    BRCA negative 11/2012, 4/19   BRCA neg 2014; MyRisk update neg except POLE VUS 4/19   Dysrhythmia    a. 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 pylori infection 10/2010   H/O exercise stress test 2018   Increased risk of breast cancer 11/2012; 4/19   5/14 IBIS=25.6%; 4/19 IBIS=25.8%   Iron deficiency anemia, unspecified    history of, normal hemoglobin 06/2019   Obesity    Seasonal allergies    ragweed, tree pollen, mild and dust per testing 08/2012   Unspecified vitamin D deficiency    Vitamin D deficiency 2017    Patient Active Problem List   Diagnosis Date Noted   CIN I (cervical intraepithelial neoplasia I) 11/07/2020   Solitary pulmonary nodule on lung CT 08/02/2020   ASCUS with positive high risk HPV cervical 04/25/2020   Reactive airway disease 01/19/2020   Weight gain 01/19/2020   Wheezing 01/19/2020   Rash 01/19/2020   Angular cheilosis 01/19/2020   Screening for  heart disease 08/17/2019   Chronic constipation 06/28/2019   Flatulence 06/28/2019   Edema 06/28/2019   Gastroesophageal reflux disease 06/28/2019   Allergic rhinitis due to pollen 06/28/2019   Anemia 08/24/2018   Cough 08/24/2018   Sinus pressure 04/03/2018   Sleep disturbance 04/03/2018   Acute right-sided back pain 04/03/2018   Polyuria 04/03/2018   Impaired fasting blood sugar 04/03/2018   History of tubal ligation 04/03/2018   Abdominal discomfort 01/28/2018   Frequency of urination 01/28/2018   Increased risk of breast cancer    BRCA negative    BV (bacterial vaginosis) 10/15/2017   Hip pain, right 04/23/2017   PVC (premature ventricular contraction) 03/05/2017   Family history of premature CAD 02/06/2017   Blurred vision 02/06/2017   Elevated blood-pressure reading without diagnosis of hypertension 02/06/2017   Heart palpitations 02/06/2017   Headache syndrome 02/06/2017   Other fatigue 02/06/2017   Daytime somnolence 02/06/2017   Family history of breast cancer 11/25/2016   Upper airway cough syndrome 09/04/2016   DOE (dyspnea on exertion) 09/04/2016   Female sterility 09/14/2015   Menorrhagia with regular cycle 09/14/2015   Uterine leiomyoma 09/14/2015   Ovarian cyst, right 09/14/2015   Vulvar lesion 09/14/2015   Chronic vaginitis 09/14/2015   Screening for hematuria or proteinuria 06/28/2015   Obesity with serious comorbidity 06/28/2015   Vitamin D deficiency 06/28/2015  Anemia, iron deficiency 06/28/2015   Microscopic hematuria 06/28/2015   Allergic rhinitis 03/31/2013    Past Surgical History:  Procedure Laterality Date   BREAST BIOPSY Right 1980s   neg   CESAREAN SECTION     DILITATION & CURRETTAGE/HYSTROSCOPY WITH NOVASURE ABLATION N/A 09/14/2015   Procedure: DILATATION & CURETTAGE/HYSTEROSCOPY WITH NOVASURE ABLATION;  Surgeon: Gae Dry, MD;  Location: [facility];  Service: Gynecology;  Laterality: N/A;   IUD REMOVAL N/A 09/14/2015   Procedure:  INTRAUTERINE DEVICE (IUD) REMOVAL;  Surgeon: Gae Dry, MD;  Location: [facility];  Service: Gynecology;  Laterality: N/A;   LAPAROSCOPIC BILATERAL SALPINGECTOMY Bilateral 09/14/2015   Procedure: LAPAROSCOPIC BILATERAL SALPINGECTOMY;  Surgeon: Gae Dry, MD;  Location: [facility];  Service: Gynecology;  Laterality: Bilateral;   TUBAL LIGATION Bilateral 09/14/2015   Procedure: BILATERAL TUBAL LIGATION;  Surgeon: Gae Dry, MD;  Location: [facility];  Service: Gynecology;  Laterality: Bilateral;   VULVAR LESION REMOVAL N/A 09/14/2015   Procedure: VULVAR LESION;  Surgeon: Gae Dry, MD;  Location: [facility];  Service: Gynecology;  Laterality: N/A;   WISDOM TOOTH EXTRACTION      OB History     Gravida  3   Para  1   Term  1   Preterm      AB  2   Living  1      SAB      IAB      Ectopic      Multiple      Live Births  1            Home Medications    Prior to Admission medications   Medication Sig Start Date End Date Taking? Authorizing Provider  albuterol (VENTOLIN HFA) 108 (90 Base) MCG/ACT inhaler Inhale 2 puffs into the lungs every 6 (six) hours as needed for wheezing or shortness of breath. Patient not taking: Reported on 12/13/2020 11/02/20   Harland Dingwall L, NP-C  cetirizine (ZYRTEC) 10 MG tablet Take 1 tablet (10 mg total) by mouth daily. 06/28/19   Tysinger, Camelia Eng, PA-C  clotrimazole-betamethasone (LOTRISONE) cream Apply 1 application topically 2 (two) times daily. 01/19/20   Tysinger, Camelia Eng, PA-C  famotidine (PEPCID) 20 MG tablet One after supper 08/02/20   Tanda Rockers, MD  HYDROcodone-homatropine [facility]) 5-1.5 MG/5ML syrup Take 5 mLs by mouth every 4 (four) hours as needed for cough. Patient not taking: No sig reported 11/07/20   Tanda Rockers, MD  montelukast (SINGULAIR) 10 MG tablet Take 1 tablet (10 mg total) by mouth at bedtime. 08/02/20   Tanda Rockers, MD  omeprazole (PRILOSEC) 40 MG capsule TAKE 1 CAPSULE BY MOUTH EVERY  DAY Patient taking differently: Take 40 mg by mouth as needed. TAKE 1 CAPSULE BY MOUTH EVERY DAY 06/28/19   Tysinger, Camelia Eng, PA-C    Family History Family History  Problem Relation Age of Onset   Heart disease Mother 3       stent, CAD   Breast cancer Mother 44       with recurrence about 64   Cancer Father        esophageal cancer   Heart disease Father 35       died of MI   Alcohol abuse Father    Esophageal cancer Father    Hypertension Sister    Hypertension Brother    CAD Brother        MI at age 15   Hypertension Brother  Breast cancer Maternal Grandmother 83   Diabetes Neg Hx    Stroke Neg Hx    Colon cancer Neg Hx    Colon polyps Neg Hx    Stomach cancer Neg Hx    Rectal cancer Neg Hx     Social History Social History   Tobacco Use   Smoking status: Never   Smokeless tobacco: Never  Vaping Use   Vaping Use: Never used  Substance Use Topics   Alcohol use: No    Comment: maybe one drink a year   Drug use: No     Allergies   Iodinated diagnostic agents   Review of Systems Review of Systems  All other systems reviewed and are negative.   Physical Exam Triage Vital Signs ED Triage Vitals  Enc Vitals Group     BP 12/28/20 1112 129/85     Pulse Rate 12/28/20 1112 100     Resp 12/28/20 1112 18     Temp 12/28/20 1112 (!) 101.7 F (38.7 C)     Temp Source 12/28/20 1112 Oral     SpO2 12/28/20 1112 98 %     Weight --      Height --      Head Circumference --      Peak Flow --      Pain Score 12/28/20 1113 7     Pain Loc --      Pain Edu? --      Excl. in [place]? --    No data found.  Updated Vital Signs BP 129/85 (BP Location: Left Arm)   Pulse 100   Temp (!) 101.7 F (38.7 C) (Oral)   Resp 18   SpO2 98%   Visual Acuity Right Eye Distance:   Left Eye Distance:   Bilateral Distance:    Right Eye Near:   Left Eye Near:    Bilateral Near:     Physical Exam Vitals and nursing note reviewed.  Constitutional:      General: She is  not in acute distress.    Appearance: She is well-developed.  HENT:     Head: Normocephalic and atraumatic.     Right Ear: Tympanic membrane normal.     Left Ear: Tympanic membrane normal.     Ears:     Comments: Normal TMs bilaterally    Mouth/Throat:     Mouth: Mucous membranes are moist.     Comments: Mild oropharyngeal erythema, no abscess Eyes:     Conjunctiva/sclera: Conjunctivae normal.  Neck:     Comments: No neck stiffness or meningismus Cardiovascular:     Rate and Rhythm: Normal rate and regular rhythm.     Heart sounds: No murmur heard. Pulmonary:     Effort: Pulmonary effort is normal. No respiratory distress.     Breath sounds: Normal breath sounds.     Comments: Lung sounds are clear bilaterally Abdominal:     Palpations: Abdomen is soft.     Tenderness: There is no abdominal tenderness.     Comments: No focal abdominal tenderness, no RLQ tenderness or pain at McBurney's point, no RUQ tenderness or Murphy's sign, no left-sided abdominal tenderness, no fluid wave, or signs of peritonitis   Musculoskeletal:     Cervical back: Neck supple.  Skin:    General: Skin is warm and dry.     Comments: No rash  Neurological:     Mental Status: She is alert.     Comments: CN III-XII intact, speech is clear,  normal finger-nose, normal gait, no pronator drift  Psychiatric:        Mood and Affect: Mood normal.        Behavior: Behavior normal.     UC Treatments / Results  Labs (all labs ordered are listed, but only abnormal results are displayed) Labs Reviewed  COVID-19, FLU A+B NAA    EKG   Radiology No results found.  Procedures Procedures (including critical care time)  Medications Ordered in UC Medications  acetaminophen (TYLENOL) tablet 650 mg (650 mg Oral Given 12/28/20 1118)    Initial Impression / Assessment and Plan / UC Course  I have reviewed the triage vital signs and the nursing notes.  Pertinent labs & imaging results that were available  during my care of the patient were reviewed by me and considered in my medical decision making (see chart for details).     Patient here with fever, body aches, and headache.  Symptoms started yesterday.  Noted to be febrile to 101.7 in triage today.  She is not hypoxic.  She is not toxic in appearance.  She has no neck stiffness, doubt meningitis.  Strep throat considered less likely based on appearance on physical exam.  No abdominal tenderness.  No urinary or vaginal symptoms.  Will swab for COVID and flu.  Discharged home with close outpatient follow-up.  Return precautions given. Final Clinical Impressions(s) / UC Diagnoses   Final diagnoses:  Encounter for screening laboratory testing for COVID-19 virus  Fever, unspecified     Discharge Instructions      Your symptoms could be consistent with COVID or flu.  I recommend that you isolate at home until you are test result.  If your results are positive, we can call you in a prescription for an antiviral if needed.  Please follow-up with your doctor, return to the urgent care, or go to the emergency department if your symptoms change or worsen.   ED Prescriptions   None    PDMP not reviewed this encounter.   Montine Circle, PA-C 12/28/20 1129

## 2020-12-29 LAB — COVID-19, FLU A+B NAA
Influenza A, NAA: NOT DETECTED
Influenza B, NAA: NOT DETECTED
SARS-CoV-2, NAA: DETECTED — AB

## 2020-12-30 ENCOUNTER — Telehealth: Payer: Self-pay | Admitting: *Deleted

## 2020-12-30 MED ORDER — MOLNUPIRAVIR EUA 200MG CAPSULE: 4.0000 | ORAL_CAPSULE | Freq: Two times a day (BID) | ORAL | 0 refills | Status: AC

## 2020-12-30 NOTE — Telephone Encounter (Signed)
Pt informed that Rx for antiviral was sent to her pharmacy on file. Pt confirmed sx onset was <5 days.  Instructed to start Rx immediately and to take as directed until completed, and to go to ED for any worsening sxs such as SOB.  Instructed to self-isolate until 5 days after sx onset, then to continue to wear a mask for an additional 5 days.  Pt verbalized understanding.

## 2020-12-30 NOTE — Telephone Encounter (Signed)
Per Patient Access, pt called stating she was told she would be called in a Rx if her Covid test came back positive.  Covid test from 12/28/20 noted to be positive w/ sxs onset 12/27/20 per chart.  Per Stephania Fragmin, PA V.O., Rx for antiviral sent to pharmacy on file.  Msg left for pt to call [facility] back.

## 2021-01-26 ENCOUNTER — Other Ambulatory Visit: Payer: Self-pay | Admitting: Medical

## 2021-01-26 ENCOUNTER — Other Ambulatory Visit: Payer: Self-pay

## 2021-01-26 ENCOUNTER — Ambulatory Visit (INDEPENDENT_AMBULATORY_CARE_PROVIDER_SITE_OTHER): Payer: 59 | Admitting: Medical

## 2021-01-26 VITALS — BP 120/74 | HR 93 | Temp 98.3°F | Resp 16 | Wt 224.4 lb

## 2021-01-26 DIAGNOSIS — Z8616 Personal history of COVID-19: Secondary | ICD-10-CM | POA: Insufficient documentation

## 2021-01-26 DIAGNOSIS — R058 Other specified cough: Secondary | ICD-10-CM | POA: Diagnosis not present

## 2021-01-26 DIAGNOSIS — Z6837 Body mass index (BMI) 37.0-37.9, adult: Secondary | ICD-10-CM

## 2021-01-26 DIAGNOSIS — R0789 Other chest pain: Secondary | ICD-10-CM | POA: Diagnosis not present

## 2021-01-26 DIAGNOSIS — N644 Mastodynia: Secondary | ICD-10-CM | POA: Insufficient documentation

## 2021-01-26 DIAGNOSIS — R059 Cough, unspecified: Secondary | ICD-10-CM | POA: Insufficient documentation

## 2021-01-26 DIAGNOSIS — R062 Wheezing: Secondary | ICD-10-CM

## 2021-01-26 DIAGNOSIS — J309 Allergic rhinitis, unspecified: Secondary | ICD-10-CM

## 2021-01-26 DIAGNOSIS — R7301 Impaired fasting glucose: Secondary | ICD-10-CM

## 2021-01-26 DIAGNOSIS — K219 Gastro-esophageal reflux disease without esophagitis: Secondary | ICD-10-CM

## 2021-01-26 DIAGNOSIS — Z8249 Family history of ischemic heart disease and other diseases of the circulatory system: Secondary | ICD-10-CM

## 2021-01-26 DIAGNOSIS — R2232 Localized swelling, mass and lump, left upper limb: Secondary | ICD-10-CM | POA: Insufficient documentation

## 2021-01-26 DIAGNOSIS — I2510 Atherosclerotic heart disease of native coronary artery without angina pectoris: Secondary | ICD-10-CM

## 2021-01-26 MED ORDER — MOMETASONE FUROATE 220 MCG/INH IN AEPB: 1.0000 | INHALATION_SPRAY | Freq: Every day | RESPIRATORY_TRACT | 2 refills | Status: DC

## 2021-01-26 MED ORDER — QVAR REDIHALER 80 MCG/ACT IN AERB: 2.0000 | INHALATION_SPRAY | Freq: Two times a day (BID) | RESPIRATORY_TRACT | 1 refills | Status: DC

## 2021-01-26 MED ORDER — ROSUVASTATIN CALCIUM 10 MG PO TABS: 10.0000 mg | ORAL_TABLET | Freq: Every day | ORAL | 3 refills | Status: DC

## 2021-01-26 MED ORDER — OZEMPIC (0.25 OR 0.5 MG/DOSE) 2 MG/1.5ML ~~LOC~~ SOPN: 0.5000 mg | PEN_INJECTOR | SUBCUTANEOUS | 1 refills | Status: DC

## 2021-01-26 MED ORDER — ALBUTEROL SULFATE HFA 108 (90 BASE) MCG/ACT IN AERS: 2.0000 | INHALATION_SPRAY | Freq: Four times a day (QID) | RESPIRATORY_TRACT | 0 refills | Status: DC | PRN

## 2021-01-26 NOTE — Telephone Encounter (Signed)
Pharmacy requesting a change in medication not on pts. Formulary for the Qvar.

## 2021-01-26 NOTE — Progress Notes (Signed)
Subjective:  Kristy [name] is a 50 y.o. female who presents for Chief Complaint  Patient presents with   Nasal Congestion    Chest congestion with discomfort, sore to touch. Coughing when lying down.      Here for complaint of chest discomfort.  She notes ongoing chest discomort x 2 wweeks.  Been using gas medication to reduce gas, using her GERD medication. She notices it more with activty. Some times has pain with pressing in on chest.  Feels pressure in mid lower chest.  She notes having COVID infection a month ago.  Her symptoms are mainly fever headache and congestion with cough.  The cough was bad at times.  But overall has improved  She was using a sample of Breo inhaler and ran out of this.  She last used it about 5 days ago with some improvement of her symptoms.  She denies current fever, body aches or chills, no nausea or vomiting.  She continues on medication for acid reflux  She has ongoing congestion and allergies.  She saw pulmonology back in May 2022.  After seeing pulmonlgy they have referred her to [facility].  Sees allergist August 10.    She has family history of heart disease including mother father and brother.  Brother got diagnosed in his mid 5s recently  She also feels a tenderness and lump in her left chest/armpit.  No other breast concerns.  She does do regular breast exams  No other aggravating or relieving factors.    No other c/o.  Past Medical History:  Diagnosis Date   Allergy    BRCA negative 11/2012, 4/19   BRCA neg 2014; MyRisk update neg except POLE VUS 4/19   Dysrhythmia    a. 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 pylori infection 10/2010   H/O exercise stress test 2018   Increased risk of breast cancer 11/2012; 4/19   5/14 IBIS=25.6%; 4/19 IBIS=25.8%   Iron deficiency anemia, unspecified    history of, normal hemoglobin  06/2019   Obesity    Seasonal allergies    ragweed, tree pollen, mild and dust per testing 08/2012   Unspecified vitamin D deficiency    Vitamin D deficiency 2017   Family History  Problem Relation Age of Onset   Heart disease Mother 50       stent, CAD   Breast cancer Mother 43       with recurrence about 55   Cancer Father        esophageal cancer   Heart disease Father 62       died of MI   Alcohol abuse Father    Esophageal cancer Father    Hypertension Sister    Hypertension Brother    CAD Brother        MI at age 48   Hypertension Brother    Breast cancer Maternal Grandmother 83   Diabetes Neg Hx    Stroke Neg Hx    Colon cancer Neg Hx    Colon polyps Neg Hx    Stomach cancer Neg Hx    Rectal cancer Neg Hx      The following portions of the patient's history were reviewed and updated as appropriate: allergies, current medications, past family history, past medical history, past social history, past surgical history and problem list.  ROS Otherwise as in  subjective above     Objective: BP 120/74   Pulse 93   Temp 98.3 F (36.8 C)   Resp 16   Wt 224 lb 6.4 oz (101.8 kg)   SpO2 98%   BMI 37.34 kg/m   General appearance: alert, no distress, well developed, well nourished, African-American female HEENT: normocephalic, sclerae anicteric, conjunctiva pink and moist, TMs pearly, nares patent, no discharge or erythema, pharynx normal Neck: supple, no lymphadenopathy, no thyromegaly, no masses, no JVD or bruits Heart: RRR, normal S1, S2, no murmurs Lungs: CTA bilaterally, no wheezes, rhonchi, or rales Abdomen: +bs, soft, mild epigastric tenderness, non tender, non distended, no masses, no hepatomegaly, no splenomegaly Pulses: 2+ radial pulses, 2+ pedal pulses, normal cap refill Ext: no edema There is a subtle firmness in the left mid axillary area that could be a nodule but it is hard to say, she was tender in the same area otherwise no obvious breast lump no  skin changes or other lymphadenopathy, exam chaperoned by nurse   Adult ECG Report  Indication: chest tenderness  Rate: 77 bpm  Rhythm: normal sinus rhythm  QRS Axis: 43 degrees  PR Interval: 168 ms  QRS Duration: 108m  QTc: 4253m Conduction Disturbances: none  Other Abnormalities: nonspecific T wave changes  Patient's cardiac risk factors are: family history of premature cardiovascular disease and obesity (BMI >= 30 kg/m2).  EKG comparison: 2021  Narrative Interpretation: new ST changes   08/08/20 chest CT IMPRESSION: 1. A previously noted 5 mm nodule in the lateral segment right middle lobe is resolved.   2.  No CT findings to explain cough.   3.  Coronary artery disease.    Assessment: Encounter Diagnoses  Name Primary?   Chest discomfort Yes   History of COVID-19    Cough    Upper airway cough syndrome    BMI 37.0-37.9, adult    Impaired fasting blood sugar    Family history of premature CAD    Allergic rhinitis, unspecified seasonality, unspecified trigger    Wheezing    Gastroesophageal reflux disease without esophagitis    Coronary artery disease involving native heart without angina pectoris, unspecified vessel or lesion type    Tightness in chest    Breast tenderness in female    Mass of left axilla      Plan: We discussed her symptoms and concerns.  Given diagnosis of COVID about a month ago I suspect her current symptoms are related to residual inflammation in the lungs.  She was seeing improvement on Breo sample inhaler.  Begin back on maintenance inhaler, continue albuterol as needed for flareup of wheezing or shortness of breath.  If maintenance inhaler too expensive or not covered call back right away.  Continue to hydrate well, relative rest.  She has upcoming allergist appointment given her longstanding congestion and sneezing.  She seems to be bothered quite a bit by wearing mask.  She had a sneezing fit in our office seem to be triggered by the  mask today  Looking back through her history, she has strong family history of heart disease in mother father and brother at young ages.  Her recent chest CT unfortunately shows some coronary artery disease.  The nodule that was previously noted has resolved.  Referral back to cardiology for further evaluation and discussion.  I discussed the need to begin on statin right away.  Plan to recheck lipids in 6 weeks  Obesity and impaired glucose-begin trial of Ozempic at her  request.  We discussed the risk and benefits and proper use of medication.  I reviewed her recent pulmonology notes from Dr. Melvyn Novas  GERD-continue acid reflux medicine and avoid acid reflux triggers  Breast tenderness and possible questionable lump in the left axilla.  Referral for breast diagnostic mammogram   Bani was seen today for nasal congestion.  Diagnoses and all orders for this visit:  Chest discomfort -     Ambulatory referral to Cardiology  History of COVID-19 -     Ambulatory referral to Cardiology  Cough  Upper airway cough syndrome  BMI 37.0-37.9, adult  Impaired fasting blood sugar -     Ambulatory referral to Cardiology  Family history of premature CAD -     Ambulatory referral to Cardiology  Allergic rhinitis, unspecified seasonality, unspecified trigger  Wheezing -     albuterol (VENTOLIN HFA) 108 (90 Base) MCG/ACT inhaler; Inhale 2 puffs into the lungs every 6 (six) hours as needed for wheezing or shortness of breath.  Gastroesophageal reflux disease without esophagitis  Coronary artery disease involving native heart without angina pectoris, unspecified vessel or lesion type -     Ambulatory referral to Cardiology  Tightness in chest -     albuterol (VENTOLIN HFA) 108 (90 Base) MCG/ACT inhaler; Inhale 2 puffs into the lungs every 6 (six) hours as needed for wheezing or shortness of breath.  Breast tenderness in female -     MM Digital Screening Unilat R; Future -     MM Digital  Diagnostic Unilat L; Future  Mass of left axilla -     MM Digital Screening Unilat R; Future -     MM Digital Diagnostic Unilat L; Future  Other orders -     beclomethasone (QVAR REDIHALER) 80 MCG/ACT inhaler; Inhale 2 puffs into the lungs 2 (two) times daily. -     Semaglutide,0.25 or 0.5MG/DOS, (OZEMPIC, 0.25 OR 0.5 MG/DOSE,) 2 MG/1.5ML SOPN; Inject 0.5 mg into the skin once a week. -     rosuvastatin (CRESTOR) 10 MG tablet; Take 1 tablet (10 mg total) by mouth daily.   Follow up: pending referral

## 2021-01-26 NOTE — Progress Notes (Signed)
done

## 2021-01-29 ENCOUNTER — Other Ambulatory Visit: Payer: Self-pay | Admitting: Medical

## 2021-02-08 ENCOUNTER — Other Ambulatory Visit: Payer: Self-pay | Admitting: Medical

## 2021-02-08 ENCOUNTER — Telehealth: Payer: Self-pay

## 2021-02-08 ENCOUNTER — Telehealth: Payer: Self-pay | Admitting: Medical

## 2021-02-08 MED ORDER — ERYTHROMYCIN 5 MG/GM OP OINT: 1.0000 "application " | TOPICAL_OINTMENT | Freq: Every day | OPHTHALMIC | 0 refills | Status: DC

## 2021-02-08 NOTE — Telephone Encounter (Signed)
Pt went by pharmacy & pharmacist recommended maxitrol due to her eye swelling.  She asks if you would send in this medication

## 2021-02-08 NOTE — Telephone Encounter (Signed)
Pt was in last Friday and stated that since then she has developed a stye. She has been doing hot compresses for 4 days now. She went to the pharmacy yesterday and they told her to ask her pcp to call in an eye drop for her. There are no available appts left today

## 2021-02-10 ENCOUNTER — Other Ambulatory Visit: Payer: Self-pay | Admitting: Family Medicine

## 2021-02-10 MED ORDER — NEOMYCIN-POLYMYXIN-DEXAMETH 3.5-10000-0.1 OP SUSP: 2.0000 [drp] | Freq: Four times a day (QID) | OPHTHALMIC | 0 refills | Status: DC

## 2021-02-21 ENCOUNTER — Ambulatory Visit: Payer: 59 | Admitting: Allergy

## 2021-02-21 ENCOUNTER — Other Ambulatory Visit: Payer: Self-pay

## 2021-02-21 ENCOUNTER — Encounter: Payer: Self-pay | Admitting: Allergy

## 2021-02-21 VITALS — BP 126/82 | HR 82 | Temp 97.7°F | Resp 16 | Ht 65.0 in | Wt 222.6 lb

## 2021-02-21 DIAGNOSIS — J4520 Mild intermittent asthma, uncomplicated: Secondary | ICD-10-CM

## 2021-02-21 DIAGNOSIS — K9049 Malabsorption due to intolerance, not elsewhere classified: Secondary | ICD-10-CM

## 2021-02-21 DIAGNOSIS — H1013 Acute atopic conjunctivitis, bilateral: Secondary | ICD-10-CM

## 2021-02-21 DIAGNOSIS — J3089 Other allergic rhinitis: Secondary | ICD-10-CM

## 2021-02-21 MED ORDER — OLOPATADINE HCL 0.2 % OP SOLN: 1.0000 [drp] | OPHTHALMIC | 5 refills | Status: DC

## 2021-02-21 MED ORDER — CETIRIZINE HCL 10 MG PO TABS: 10.0000 mg | ORAL_TABLET | Freq: Two times a day (BID) | ORAL | 3 refills | Status: DC

## 2021-02-21 MED ORDER — AZELASTINE-FLUTICASONE 137-50 MCG/ACT NA SUSP: 2.0000 | NASAL | 5 refills | Status: DC

## 2021-02-21 NOTE — Progress Notes (Signed)
  New Patient Note  RE: Kristy [name] MRN: [id number] DOB: 03/28/1971 Date of Office Visit: 02/21/2021  Referring provider: Wert, Michael B, MD Primary care provider: Tysinger, David S, PA-C  Chief Complaint: allergies  History of present illness: Kristy [name] is a 50 y.o. female presenting today for consultation for allergies.   She states she reacted to a dog at a friends home in [place] around March 2022.  She states when she walked in the home her eyes started itching really bad.  She then states her symptoms worsened.  She did take benadryl.  The next morning she states she was short of breath and had sneezing and congestion.  She used albuterol inhaler that did help.    She states the sneezing and congestion are her biggest symptoms.  She also reports nasal drainage and throat clearing.  Symptoms are year-round.  She has tried chlorpheniramine that she takes nightly and feels it helps somewhat.  She is on singulair daily for past 3 months.  She will also take zyrtec as needed and takes about 2-3 times a week.  She does feel zyrtec helps.  Has not tried xyzal or allegra before.  She did use flonase with the dog exposure above and states it did help with congestion.  She also used over-the-counter eye drop as well with dog exposure.  She states she has had reactions to cats before but this is first time she ever had symptoms with dog exposure.   She has seen Dr. Wert in pulmonary and likely due to upper airway cough syndrome.  However appears she was referred for congestion and shortness of breath. She can have shortness of breath with activities like walking up stairs.  She states she can wheeze when allergy symptoms are worse.  She denies a diagnosis of asthma.    Denies history of eczema or food allergy.    She has taken Prilosec and takes as needed for reflux control.   She is a former pt of the practice and believes last time she was seen was around 2014 by Dr Kozlow.    Review of systems in the past 4 weeks: Review of Systems  Constitutional: Negative.   HENT:         See HPI  Eyes: Negative.   Respiratory:         See HPI  Cardiovascular: Negative.   Gastrointestinal: Negative.   Musculoskeletal: Negative.   Skin: Negative.   Neurological: Negative.    All other systems negative unless noted above in HPI  Past medical history: Past Medical History:  Diagnosis Date   Allergy    BRCA negative 11/2012, 4/19   BRCA neg 2014; MyRisk update neg except POLE VUS 4/19   Dysrhythmia    a. 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 pylori infection 10/2010   H/O exercise stress test 2018   Increased risk of breast cancer 11/2012; 4/19   5/14 IBIS=25.6%; 4/19 IBIS=25.8%   Iron deficiency anemia, unspecified    history of, normal hemoglobin 06/2019   Obesity    Seasonal allergies    ragweed, tree pollen, mild and dust per testing 08/2012   Unspecified vitamin D deficiency    Vitamin D deficiency 2017    Past surgical history: Past Surgical History:  Procedure Laterality Date   BREAST BIOPSY Right 1980s   neg       New Patient Note  RE: Kristy [name] MRN: [id number] DOB: 03/28/1971 Date of Office Visit: 02/21/2021  Referring provider: Wert, Michael B, MD Primary care provider: Tysinger, David S, PA-C  Chief Complaint: allergies  History of present illness: Kristy [name] is a 50 y.o. female presenting today for consultation for allergies.   She states she reacted to a dog at a friends home in [place] around March 2022.  She states when she walked in the home her eyes started itching really bad.  She then states her symptoms worsened.  She did take benadryl.  The next morning she states she was short of breath and had sneezing and congestion.  She used albuterol inhaler that did help.    She states the sneezing and congestion are her biggest symptoms.  She also reports nasal drainage and throat clearing.  Symptoms are year-round.  She has tried chlorpheniramine that she takes nightly and feels it helps somewhat.  She is on singulair daily for past 3 months.  She will also take zyrtec as needed and takes about 2-3 times a week.  She does feel zyrtec helps.  Has not tried xyzal or allegra before.  She did use flonase with the dog exposure above and states it did help with congestion.  She also used over-the-counter eye drop as well with dog exposure.  She states she has had reactions to cats before but this is first time she ever had symptoms with dog exposure.   She has seen Dr. Wert in pulmonary and likely due to upper airway cough syndrome.  However appears she was referred for congestion and shortness of breath. She can have shortness of breath with activities like walking up stairs.  She states she can wheeze when allergy symptoms are worse.  She denies a diagnosis of asthma.    Denies history of eczema or food allergy.    She has taken Prilosec and takes as needed for reflux control.   She is a former pt of the practice and believes last time she was seen was around 2014 by Dr Kozlow.    Review of systems in the past 4 weeks: Review of Systems  Constitutional: Negative.   HENT:         See HPI  Eyes: Negative.   Respiratory:         See HPI  Cardiovascular: Negative.   Gastrointestinal: Negative.   Musculoskeletal: Negative.   Skin: Negative.   Neurological: Negative.    All other systems negative unless noted above in HPI  Past medical history: Past Medical History:  Diagnosis Date   Allergy    BRCA negative 11/2012, 4/19   BRCA neg 2014; MyRisk update neg except POLE VUS 4/19   Dysrhythmia    a. 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 pylori infection 10/2010   H/O exercise stress test 2018   Increased risk of breast cancer 11/2012; 4/19   5/14 IBIS=25.6%; 4/19 IBIS=25.8%   Iron deficiency anemia, unspecified    history of, normal hemoglobin 06/2019   Obesity    Seasonal allergies    ragweed, tree pollen, mild and dust per testing 08/2012   Unspecified vitamin D deficiency    Vitamin D deficiency 2017    Past surgical history: Past Surgical History:  Procedure Laterality Date   BREAST BIOPSY Right 1980s   neg       New Patient Note  RE: Kristy [name] MRN: [id number] DOB: 03/28/1971 Date of Office Visit: 02/21/2021  Referring provider: Wert, Michael B, MD Primary care provider: Tysinger, David S, PA-C  Chief Complaint: allergies  History of present illness: Kristy [name] is a 50 y.o. female presenting today for consultation for allergies.   She states she reacted to a dog at a friends home in [place] around March 2022.  She states when she walked in the home her eyes started itching really bad.  She then states her symptoms worsened.  She did take benadryl.  The next morning she states she was short of breath and had sneezing and congestion.  She used albuterol inhaler that did help.    She states the sneezing and congestion are her biggest symptoms.  She also reports nasal drainage and throat clearing.  Symptoms are year-round.  She has tried chlorpheniramine that she takes nightly and feels it helps somewhat.  She is on singulair daily for past 3 months.  She will also take zyrtec as needed and takes about 2-3 times a week.  She does feel zyrtec helps.  Has not tried xyzal or allegra before.  She did use flonase with the dog exposure above and states it did help with congestion.  She also used over-the-counter eye drop as well with dog exposure.  She states she has had reactions to cats before but this is first time she ever had symptoms with dog exposure.   She has seen Dr. Wert in pulmonary and likely due to upper airway cough syndrome.  However appears she was referred for congestion and shortness of breath. She can have shortness of breath with activities like walking up stairs.  She states she can wheeze when allergy symptoms are worse.  She denies a diagnosis of asthma.    Denies history of eczema or food allergy.    She has taken Prilosec and takes as needed for reflux control.   She is a former pt of the practice and believes last time she was seen was around 2014 by Dr Kozlow.    Review of systems in the past 4 weeks: Review of Systems  Constitutional: Negative.   HENT:         See HPI  Eyes: Negative.   Respiratory:         See HPI  Cardiovascular: Negative.   Gastrointestinal: Negative.   Musculoskeletal: Negative.   Skin: Negative.   Neurological: Negative.    All other systems negative unless noted above in HPI  Past medical history: Past Medical History:  Diagnosis Date   Allergy    BRCA negative 11/2012, 4/19   BRCA neg 2014; MyRisk update neg except POLE VUS 4/19   Dysrhythmia    a. 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 pylori infection 10/2010   H/O exercise stress test 2018   Increased risk of breast cancer 11/2012; 4/19   5/14 IBIS=25.6%; 4/19 IBIS=25.8%   Iron deficiency anemia, unspecified    history of, normal hemoglobin 06/2019   Obesity    Seasonal allergies    ragweed, tree pollen, mild and dust per testing 08/2012   Unspecified vitamin D deficiency    Vitamin D deficiency 2017    Past surgical history: Past Surgical History:  Procedure Laterality Date   BREAST BIOPSY Right 1980s   neg       New Patient Note  RE: Kristy [name] MRN: [id number] DOB: 03/28/1971 Date of Office Visit: 02/21/2021  Referring provider: Wert, Michael B, MD Primary care provider: Tysinger, David S, PA-C  Chief Complaint: allergies  History of present illness: Kristy [name] is a 50 y.o. female presenting today for consultation for allergies.   She states she reacted to a dog at a friends home in [place] around March 2022.  She states when she walked in the home her eyes started itching really bad.  She then states her symptoms worsened.  She did take benadryl.  The next morning she states she was short of breath and had sneezing and congestion.  She used albuterol inhaler that did help.    She states the sneezing and congestion are her biggest symptoms.  She also reports nasal drainage and throat clearing.  Symptoms are year-round.  She has tried chlorpheniramine that she takes nightly and feels it helps somewhat.  She is on singulair daily for past 3 months.  She will also take zyrtec as needed and takes about 2-3 times a week.  She does feel zyrtec helps.  Has not tried xyzal or allegra before.  She did use flonase with the dog exposure above and states it did help with congestion.  She also used over-the-counter eye drop as well with dog exposure.  She states she has had reactions to cats before but this is first time she ever had symptoms with dog exposure.   She has seen Dr. Wert in pulmonary and likely due to upper airway cough syndrome.  However appears she was referred for congestion and shortness of breath. She can have shortness of breath with activities like walking up stairs.  She states she can wheeze when allergy symptoms are worse.  She denies a diagnosis of asthma.    Denies history of eczema or food allergy.    She has taken Prilosec and takes as needed for reflux control.   She is a former pt of the practice and believes last time she was seen was around 2014 by Dr Kozlow.    Review of systems in the past 4 weeks: Review of Systems  Constitutional: Negative.   HENT:         See HPI  Eyes: Negative.   Respiratory:         See HPI  Cardiovascular: Negative.   Gastrointestinal: Negative.   Musculoskeletal: Negative.   Skin: Negative.   Neurological: Negative.    All other systems negative unless noted above in HPI  Past medical history: Past Medical History:  Diagnosis Date   Allergy    BRCA negative 11/2012, 4/19   BRCA neg 2014; MyRisk update neg except POLE VUS 4/19   Dysrhythmia    a. 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 pylori infection 10/2010   H/O exercise stress test 2018   Increased risk of breast cancer 11/2012; 4/19   5/14 IBIS=25.6%; 4/19 IBIS=25.8%   Iron deficiency anemia, unspecified    history of, normal hemoglobin 06/2019   Obesity    Seasonal allergies    ragweed, tree pollen, mild and dust per testing 08/2012   Unspecified vitamin D deficiency    Vitamin D deficiency 2017    Past surgical history: Past Surgical History:  Procedure Laterality Date   BREAST BIOPSY Right 1980s   neg  

## 2021-02-21 NOTE — Patient Instructions (Addendum)
-  Environmental allergy testing is positive to grass pollen, weed pollen, tree pollen, molds, dust mite cat and dog.  -Allergen avoidance measures discussed/handouts provided -Recommend use of Zyrtec 10 mg daily.  Can take additional dose if necessary.  Other long-acting antihistamines including Xyzal and Allegra that may be equally or more effective.  These are all over-the-counter options -Continue Singulair 10 mg nightly -trial dymista 1 spray each nostril twice a day.  This is a combination nasal spray with Flonase + Astelin (nasal antihistamine).  This helps with both nasal congestion and drainage.  If this is not covered by insurance then will prescribe the 2 sprays separately -For itchy watery eyes use olopatadine 0.2% 1 drop each eye daily as needed -allergen immunotherapy discussed today including protocol, benefits and risk.  Informational handout provided.  If interested in this therapuetic option you can check with your insurance carrier for coverage.  Let us know if you would like to proceed with this option.    -have access to albuterol inhaler 2 puffs every 4-6 hours as needed for cough/wheeze/shortness of breath/chest tightness.  May use 15-20 minutes prior to activity.   Monitor frequency of use.    Control goals:  Full participation in all desired activities (may need albuterol before activity) Albuterol use two time or less a week on average (not counting use with activity) Cough interfering with sleep two time or less a month Oral steroids no more than once a year No hospitalizations  - We have discussed the following in regards to foods:   Allergy: food allergy is when you have eaten a food, developed an allergic reaction after eating the food and have IgE to the food (positive food testing either by skin testing or blood testing).  Food allergy could lead to life threatening symptoms  Sensitivity: occurs when you have IgE to a food (positive food testing either by skin testing  or blood testing) but is a food you eat without any issues.  This is not an allergy and we recommend keeping the food in the diet  Intolerance: this is when you have negative testing by either skin testing or blood testing thus not allergic but the food causes symptoms (like belly pain, bloating, diarrhea etc) with ingestion.  These foods should be avoided to prevent symptoms.    -This most likely that you have food intolerance especially to lactose -Food testing to green vegetables are negative -you do not have food allergy  Follow-up in 4 months or sooner if needed

## 2021-02-22 NOTE — Telephone Encounter (Signed)
done

## 2021-02-23 ENCOUNTER — Other Ambulatory Visit: Payer: Self-pay

## 2021-02-23 MED ORDER — AZELASTINE HCL 0.1 % NA SOLN: 2.0000 | Freq: Two times a day (BID) | NASAL | 5 refills | Status: DC

## 2021-02-23 MED ORDER — FLUTICASONE PROPIONATE 50 MCG/ACT NA SUSP: 2.0000 | Freq: Every day | NASAL | 5 refills | Status: DC

## 2021-02-27 ENCOUNTER — Other Ambulatory Visit: Payer: Self-pay

## 2021-02-27 MED ORDER — AZELASTINE HCL 0.1 % NA SOLN: 2.0000 | Freq: Two times a day (BID) | NASAL | 5 refills | Status: DC

## 2021-02-27 MED ORDER — FLUTICASONE PROPIONATE 50 MCG/ACT NA SUSP: 2.0000 | Freq: Every day | NASAL | 5 refills | Status: DC

## 2021-03-21 ENCOUNTER — Telehealth: Payer: Self-pay | Admitting: Medical

## 2021-03-21 ENCOUNTER — Other Ambulatory Visit: Payer: Self-pay | Admitting: Medical

## 2021-03-21 MED ORDER — OZEMPIC (1 MG/DOSE) 4 MG/3ML ~~LOC~~ SOPN: 1.0000 mg | PEN_INJECTOR | SUBCUTANEOUS | 0 refills | Status: DC

## 2021-03-21 NOTE — Telephone Encounter (Signed)
Pt scheduled  

## 2021-03-21 NOTE — Telephone Encounter (Signed)
Pt called and wanted to see if she could get her Ozempic increased because the dose she is currently on is not helping control her appetite. [facility] [address]

## 2021-04-08 ENCOUNTER — Telehealth: Payer: Self-pay

## 2021-04-08 NOTE — Telephone Encounter (Signed)
P.A. OZEMPIC, tried to complete on Cover my meds earlier this week with Elixir not covered and tried with [facility] problems with name or date of birth, couldn't complete there.   Called [facility] [phone] XI#[id number] and completed over the phone PA# [id number]

## 2021-04-10 ENCOUNTER — Telehealth: Payer: Self-pay | Admitting: Medical

## 2021-04-10 MED ORDER — OZEMPIC (1 MG/DOSE) 4 MG/3ML ~~LOC~~ SOPN
1.0000 mg | PEN_INJECTOR | SUBCUTANEOUS | 0 refills | Status: DC
Start: 2021-04-10 — End: 2021-04-19

## 2021-04-10 NOTE — Telephone Encounter (Signed)
Sent med to pharmacy  

## 2021-04-10 NOTE — Telephone Encounter (Signed)
[facility] pt called and states that prior auth for ozempic has been approved but must be written for 90 days. Please send in 90 day supply to [place] [address] in [place]. Pt can be reached at [phone]

## 2021-04-13 NOTE — Telephone Encounter (Signed)
P.A. denied, only covered for diabetes, no alternatives given.  Called and left pt a message to call for alternatives.

## 2021-04-18 NOTE — Telephone Encounter (Signed)
Pt checked with her Insurance and said Kristy [name] and Kristy [name] are covered by her insurance but it has to be written as a 90 day supply. She said she is in [place] today so [facility] can be [facility] [address].

## 2021-04-19 ENCOUNTER — Other Ambulatory Visit: Payer: Self-pay | Admitting: Medical

## 2021-04-19 MED ORDER — SAXENDA 18 MG/3ML ~~LOC~~ SOPN: 3.0000 mg | PEN_INJECTOR | Freq: Every day | SUBCUTANEOUS | 0 refills | Status: DC

## 2021-04-19 NOTE — Progress Notes (Signed)
Chief Complaint  Patient presents with   Gynecologic Exam    Lump on LB tender at times    HPI:      Kristy [name] is a 50 y.o. [id number] who LMP was No LMP recorded. Patient has had an ablation., presents today for her annual examination. Her menses have been absent since novasure ablation. No BTB, no dysmen. Has mult small leio on u/s (1.4 cm-2.2 cm).  Having significant vasomotor sx. Would prefer non-hormonal tx.    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 Kenton Kingfisher 10/21; 6 mo repeat pap 4/22 was neg cells (no HPV DNA done)  Hx of STDs: HPV on pap  She has a hx of BV and AV in past.   Last mammogram: 05/16/20  Results were: normal--routine follow-up in 12 months.  There is a FH of breast cancer in her mom and MGM. There is no FH of ovarian cancer. The patient does do self-breast exams. Pt was BRCA neg 5/14. Myrisk neg 2019. IBIS=25.8%. Has not had scr breast MRI. Has had tender area LT breast since last mammo. No masses but can feel area; no change in size. Drinks a lot of caffeine.    Tobacco use: The patient denies current or previous tobacco use. Alcohol KGY:JEHU No drug use. Exercise: occas active  Colonoscopy: 2021 per pt with polyps, repeat due after 5 yrs per pt. (Can't find records in [place])   She does get adequate calcium and Vitamin D in her diet.   Borderline lipids 2019 and 2020 and Vit D deficiency 2019.  Due for fasting labs, not fasting today.  Past Medical History:  Diagnosis Date   Allergy    BRCA negative 11/2012, 4/19   BRCA neg 2014; MyRisk update neg except POLE VUS 4/19   Dysrhythmia    a. 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 pylori infection 10/2010   H/O exercise stress test 2018   Increased risk of  breast cancer 11/2012; 4/19   5/14 IBIS=25.6%; 4/19 IBIS=25.8%   Iron deficiency anemia, unspecified    history of, normal hemoglobin 06/2019   Obesity    Seasonal allergies    ragweed, tree pollen, mild and dust per testing 08/2012   Unspecified vitamin D deficiency    Vitamin D deficiency 2017    Past Surgical History:  Procedure Laterality Date   BREAST BIOPSY Right 1980s   neg   [place] N/A 09/14/2015   Procedure: DILATATION & CURETTAGE/HYSTEROSCOPY WITH NOVASURE ABLATION;  Surgeon: Gae Dry, MD;  Location: [facility];  Service: Gynecology;  Laterality: N/A;   IUD REMOVAL N/A 09/14/2015   Procedure: INTRAUTERINE DEVICE (IUD) REMOVAL;  Surgeon: Gae Dry, MD;  Location: [facility];  Service: Gynecology;  Laterality: N/A;   LAPAROSCOPIC BILATERAL SALPINGECTOMY Bilateral 09/14/2015   Procedure: LAPAROSCOPIC BILATERAL SALPINGECTOMY;  Surgeon: Gae Dry, MD;  Location: [facility];  Service: Gynecology;  Laterality: Bilateral;   TUBAL LIGATION Bilateral 09/14/2015   Procedure: BILATERAL TUBAL LIGATION;  Surgeon: Gae Dry, MD;  Location: [facility];  Service: Gynecology;  Laterality: Bilateral;   VULVAR LESION REMOVAL N/A 09/14/2015   Procedure: VULVAR LESION;  Surgeon: Gae Dry, MD;  Location: [facility];  Service: Gynecology;  Laterality: N/A;   WISDOM TOOTH EXTRACTION      Family History  Problem Relation Age of Onset   Heart disease Mother 79       stent, CAD   Breast cancer Mother 61       with recurrence about 45   Cancer Father        esophageal cancer   Heart disease Father 58       died of MI   Alcohol abuse Father    Esophageal cancer Father    Hypertension Sister    Hypertension Brother    CAD Brother        MI at age 42   Hypertension Brother    Breast cancer Maternal Grandmother 83   Diabetes Neg Hx    Stroke Neg Hx    Colon cancer Neg Hx    Colon polyps Neg Hx    Stomach  cancer Neg Hx    Rectal cancer Neg Hx     Social History   Socioeconomic History   Marital status: Single    Spouse name: Not on file   Number of children: Not on file   Years of education: Not on file   Highest education level: Not on file  Occupational History   Not on file  Tobacco Use   Smoking status: Never   Smokeless tobacco: Never  Vaping Use   Vaping Use: Never used  Substance and Sexual Activity   Alcohol use: No    Comment: maybe one drink a year   Drug use: No   Sexual activity: Yes    Birth control/protection: Surgical    Comment: reports ablation and partner had vasectomy.   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 8 g 0   azelastine (ASTELIN) 0.1 % nasal spray Place 2 sprays into both nostrils 2 (two) times daily. Use in each nostril as directed 30 mL 5   Azelastine-Fluticasone (DYMISTA) 137-50 MCG/ACT SUSP Place 2 sprays into both nostrils 1 day or 1 dose. 23 g 5   cetirizine (ZYRTEC) 10 MG tablet Take 1 tablet (10 mg total) by mouth 2 (two) times daily. 60 tablet 3   famotidine (PEPCID) 20 MG tablet One after supper 30 tablet 11   omeprazole (PRILOSEC) 40 MG capsule TAKE 1 CAPSULE BY MOUTH EVERY DAY (Patient taking differently: Take 40 mg by mouth as needed. TAKE 1 CAPSULE BY MOUTH EVERY DAY) 90 capsule 3   rosuvastatin  (CRESTOR) 10 MG tablet Take 1 tablet (10 mg total) by mouth daily. 90 tablet 3   Liraglutide -Weight Management (SAXENDA) 18 MG/3ML SOPN Inject 3 mg into the skin daily. Start 0.83m daily x 1 week, then increase to 1.28mdaily x  1wk, then 1.32m daily x 1 wk, then 378mdaily (Patient not taking: Reported on 04/24/2021) 9 mL 0   Current Facility-Administered Medications on File Prior to Visit  Medication Dose Route Frequency Provider Last Rate Last Admin   0.9 %  sodium chloride infusion  500 mL Intravenous Once StLadene ArtistMD         ROS:  Review of Systems  Constitutional:  Negative for fatigue, fever and unexpected weight change.  Respiratory:  Negative for cough, shortness of breath and wheezing.   Cardiovascular:  Negative for chest pain, palpitations and leg swelling.  Gastrointestinal:  Negative for blood in stool, constipation, diarrhea, nausea and vomiting.  Endocrine: Negative for cold intolerance, heat intolerance and polyuria.  Genitourinary:  Negative for dyspareunia, dysuria, flank pain, frequency, genital sores, hematuria, menstrual problem, pelvic pain, urgency, vaginal bleeding, vaginal discharge and vaginal pain.  Musculoskeletal:  Negative for back pain, joint swelling and myalgias.  Skin:  Negative for rash.  Neurological:  Negative for dizziness, syncope, light-headedness, numbness and headaches.  Hematological:  Negative for adenopathy.  Psychiatric/Behavioral:  Negative for agitation, confusion, sleep disturbance and suicidal ideas. The patient is not nervous/anxious.  BREAST: lump and tenderness   Objective: BP (!) 150/80   Ht 5' 5"  (1.651 m)   Wt 226 lb (102.5 kg)   BMI 37.61 kg/m    Physical Exam Constitutional:      Appearance: She is well-developed.  Genitourinary:     Vulva normal.     Right Labia: No rash, tenderness or lesions.    Left Labia: No tenderness, lesions or rash.    No vaginal discharge, erythema or tenderness.      Right Adnexa:  not tender and no mass present.    Left Adnexa: not tender and no mass present.    No cervical friability or polyp.     Uterus is not enlarged or tender.  Breasts:    Right: No mass, nipple discharge, skin change or tenderness.     Left: No mass, nipple discharge, skin change or tenderness.  Neck:     Thyroid: No thyromegaly.  Cardiovascular:     Rate and Rhythm: Normal rate and regular rhythm.     Heart sounds: Normal heart sounds. No murmur heard. Pulmonary:     Effort: Pulmonary effort is normal.     Breath sounds: Normal breath sounds.  Abdominal:     Palpations: Abdomen is soft.     Tenderness: There is no abdominal tenderness. There is no guarding or rebound.  Musculoskeletal:        General: Normal range of motion.     Cervical back: Normal range of motion.  Lymphadenopathy:     Cervical: No cervical adenopathy.  Neurological:     General: No focal deficit present.     Mental Status: She is alert and oriented to person, place, and time.     Cranial Nerves: No cranial nerve deficit.  Skin:    General: Skin is warm and dry.  Psychiatric:        Mood and Affect: Mood normal.        Behavior: Behavior normal.        Thought Content: Thought content normal.        Judgment: Judgment normal.  Vitals reviewed.    Assessment/Plan: Encounter for annual routine gynecological examination  Cervical cancer screening - Plan: Cytology - PAP  Screening for HPV (human papillomavirus) - Plan: Cytology - PAP  Dysplasia of cervix,  low grade (CIN 1) - Plan: Cytology - PAP; repeat pap today  Encounter for screening mammogram for malignant neoplasm of breast - Plan: MM 3D SCREEN BREAST BILATERAL; pt to sched mammo  Family history of breast cancer - Plan: MM 3D SCREEN BREAST BILATERAL; pt is MyRisk neg  Increased risk of breast cancer - Plan: MM 3D SCREEN BREAST BILATERAL; pt aware of monthly SBE, yearly CBE and mammos, as well as scr breast MRI. Will call for ref prn.   Breast  tenderness--LT breast since last mammo; neg breast exam for masses. D/C caffeine, cont screening mammo 11/22. F/u prn.   Perimenopausal vasomotor symptoms--discussed tx options. Pt will try estroven  Blood tests for routine general physical examination - Plan: Comprehensive metabolic panel, Lipid panel, Lipid panel, Comprehensive metabolic panel  Screening cholesterol level - Plan: Lipid panel, Lipid panel  Elevated LDL cholesterol level - Plan: Lipid panel, Lipid panel  BMI 37.0-37.9, adult - Plan: Comprehensive metabolic panel, Lipid panel, Lipid panel, Comprehensive metabolic panel         GYN counsel breast self exam, mammography screening, adequate intake of calcium and vitamin D, diet and exercise     F/U  Return in about 1 year (around 04/24/2022).   Kristy Raspberry B. Lacreasha Hinds, PA-C 04/24/2021 9:34 AM

## 2021-04-24 ENCOUNTER — Ambulatory Visit
Admission: RE | Admit: 2021-04-24 | Discharge: 2021-04-24 | Disposition: A | Discharge status: Home / Self Care | Payer: 59 | Source: Ambulatory Visit | Attending: Obstetrics and Gynecology | Admitting: Obstetrics and Gynecology

## 2021-04-24 ENCOUNTER — Ambulatory Visit (INDEPENDENT_AMBULATORY_CARE_PROVIDER_SITE_OTHER): Payer: 59 | Admitting: Obstetrics and Gynecology

## 2021-04-24 ENCOUNTER — Telehealth: Payer: Self-pay

## 2021-04-24 ENCOUNTER — Other Ambulatory Visit: Payer: Self-pay

## 2021-04-24 ENCOUNTER — Encounter: Payer: Self-pay | Admitting: Obstetrics and Gynecology

## 2021-04-24 ENCOUNTER — Other Ambulatory Visit (HOSPITAL_COMMUNITY)
Admission: RE | Admit: 2021-04-24 | Discharge: 2021-04-24 | Disposition: A | Discharge status: Home / Self Care | Payer: 59 | Source: Ambulatory Visit | Attending: Obstetrics and Gynecology | Admitting: Obstetrics and Gynecology

## 2021-04-24 VITALS — BP 150/80 | Ht 65.0 in | Wt 226.0 lb

## 2021-04-24 DIAGNOSIS — Z803 Family history of malignant neoplasm of breast: Secondary | ICD-10-CM | POA: Insufficient documentation

## 2021-04-24 DIAGNOSIS — Z1211 Encounter for screening for malignant neoplasm of colon: Secondary | ICD-10-CM

## 2021-04-24 DIAGNOSIS — Z1231 Encounter for screening mammogram for malignant neoplasm of breast: Secondary | ICD-10-CM

## 2021-04-24 DIAGNOSIS — Z0000 Encounter for general adult medical examination without abnormal findings: Secondary | ICD-10-CM

## 2021-04-24 DIAGNOSIS — Z01419 Encounter for gynecological examination (general) (routine) without abnormal findings: Secondary | ICD-10-CM | POA: Diagnosis not present

## 2021-04-24 DIAGNOSIS — Z124 Encounter for screening for malignant neoplasm of cervix: Secondary | ICD-10-CM | POA: Diagnosis not present

## 2021-04-24 DIAGNOSIS — Z1151 Encounter for screening for human papillomavirus (HPV): Secondary | ICD-10-CM

## 2021-04-24 DIAGNOSIS — N870 Mild cervical dysplasia: Secondary | ICD-10-CM | POA: Insufficient documentation

## 2021-04-24 DIAGNOSIS — N951 Menopausal and female climacteric states: Secondary | ICD-10-CM

## 2021-04-24 DIAGNOSIS — Z9189 Other specified personal risk factors, not elsewhere classified: Secondary | ICD-10-CM | POA: Insufficient documentation

## 2021-04-24 DIAGNOSIS — N644 Mastodynia: Secondary | ICD-10-CM

## 2021-04-24 DIAGNOSIS — E7800 Pure hypercholesterolemia, unspecified: Secondary | ICD-10-CM

## 2021-04-24 DIAGNOSIS — Z6837 Body mass index (BMI) 37.0-37.9, adult: Secondary | ICD-10-CM

## 2021-04-24 DIAGNOSIS — Z13220 Encounter for screening for lipoid disorders: Secondary | ICD-10-CM

## 2021-04-24 NOTE — Telephone Encounter (Signed)
P.A. SAXENDA  

## 2021-04-24 NOTE — Patient Instructions (Signed)
I value your feedback and you entrusting us with your care. If you get a  patient survey, I would appreciate you taking the time to let us know about your experience today. Thank you!  [facility]: [phone]      

## 2021-04-25 ENCOUNTER — Encounter: Payer: Self-pay | Admitting: Cardiology

## 2021-04-25 ENCOUNTER — Ambulatory Visit: Payer: 59 | Admitting: Cardiology

## 2021-04-25 ENCOUNTER — Ambulatory Visit: Payer: 59 | Admitting: Interventional Cardiology

## 2021-04-25 VITALS — BP 150/80 | HR 82 | Ht 65.0 in | Wt 230.0 lb

## 2021-04-25 DIAGNOSIS — R079 Chest pain, unspecified: Secondary | ICD-10-CM | POA: Diagnosis not present

## 2021-04-25 DIAGNOSIS — Z8249 Family history of ischemic heart disease and other diseases of the circulatory system: Secondary | ICD-10-CM | POA: Diagnosis not present

## 2021-04-25 DIAGNOSIS — R072 Precordial pain: Secondary | ICD-10-CM

## 2021-04-25 DIAGNOSIS — I2510 Atherosclerotic heart disease of native coronary artery without angina pectoris: Secondary | ICD-10-CM | POA: Diagnosis not present

## 2021-04-25 MED ORDER — DIPHENHYDRAMINE HCL 50 MG PO TABS: 50.0000 mg | ORAL_TABLET | Freq: Once | ORAL | 0 refills | Status: DC

## 2021-04-25 MED ORDER — ASPIRIN EC 81 MG PO TBEC: 81.0000 mg | DELAYED_RELEASE_TABLET | Freq: Every day | ORAL | 3 refills | Status: AC

## 2021-04-25 MED ORDER — METOPROLOL TARTRATE 100 MG PO TABS: 100.0000 mg | ORAL_TABLET | Freq: Once | ORAL | 0 refills | Status: DC

## 2021-04-25 MED ORDER — PREDNISONE 50 MG PO TABS: ORAL_TABLET | ORAL | 0 refills | Status: DC

## 2021-04-25 NOTE — Assessment & Plan Note (Signed)
Coronary artery calcification seen in the right coronary artery on CT scan personally reviewed.  Showed her the pictures.  Agree with Crestor 10 mg once a day.  Kristy [name] is rechecking her lipid panel soon.  We would like her LDL to be less than 70.  A moderate intensity statin, Crestor 20 would make sense eventually.  In order to evaluate her coronary arteries more thoroughly, we will go ahead and proceed with cardiac CT scan with contrast.  She has had contrast allergy in the past with itchiness after her contrast administration.  We will go ahead and premedicate her.  I will also give her metoprolol tartrate 100 mg prior to scan given her heart rate of 82 and blood pressure 150/80.

## 2021-04-25 NOTE — Patient Instructions (Addendum)
Medication Instructions:  Please start Asprin 81 mg a day. Continue all other medications as listed.  The current medical regimen is effective;  continue present plan and medications.  *If you need a refill on your cardiac medications before your next appointment, please call your pharmacy*  Testing/Procedures:    Your cardiac CT will be scheduled at:   [facility] [address] [place], [place] [address] [phone]  Please arrive at the [facility] main entrance (entrance A) of [facility] 30 minutes prior to test start time. Proceed to the [facility] Radiology Department (first floor) to check-in and test prep.  Please follow these instructions carefully (unless otherwise directed):  On the Night Before the Test: Be sure to Drink plenty of water. Do not consume any caffeinated/decaffeinated beverages or chocolate 12 hours prior to your test. Do not take any antihistamines 12 hours prior to your test. If the patient has contrast allergy: Patient will need a prescription for Prednisone and very clear instructions (as follows): Prednisone 50 mg - take 13 hours prior to test Take another Prednisone 50 mg 7 hours prior to test Take another Prednisone 50 mg 1 hour prior to test Take Benadryl 50 mg 1 hour prior to test Patient must complete all four doses of above prophylactic medications. Patient will need a ride after test due to Benadryl.  On the Day of the Test: Drink plenty of water until 1 hour prior to the test. Do not eat any food 4 hours prior to the test. You may take your regular medications prior to the test.  Take metoprolol (Lopressor) two hours prior to test. HOLD Furosemide/Hydrochlorothiazide morning of the test. FEMALES- please wear underwire-free bra if available, avoid dresses & tight clothing  After the Test: Drink plenty of water. After receiving IV contrast, you may experience a mild flushed feeling. This is normal. On  occasion, you may experience a mild rash up to 24 hours after the test. This is not dangerous. If this occurs, you can take Benadryl 25 mg and increase your fluid intake. If you experience trouble breathing, this can be serious. If it is severe call 911 IMMEDIATELY. If it is mild, please call our office.  Please allow 2-4 weeks for scheduling of routine cardiac CTs. Some insurance companies require a pre-authorization which may delay scheduling of this test.   For non-scheduling related questions, please contact the cardiac imaging nurse navigator should you have any questions/concerns: Marchia Bond, Cardiac Imaging Nurse Navigator Gordy Clement, Cardiac Imaging Nurse Navigator [facility] Direct Office Dial: [phone]   For scheduling needs, including cancellations and rescheduling, please call [place], [phone].  Follow-Up: At [facility] you and your health needs are our priority.  As part of our continuing mission to provide you with exceptional heart care, we have created designated Provider Care Teams.  These Care Teams include your primary Cardiologist (physician) and Advanced Practice Providers (APPs -  Physician Assistants and Nurse Practitioners) who all work together to provide you with the care you need, when you need it.  We recommend signing up for the patient portal called "MyChart".  Sign up information is provided on this After Visit Summary.  MyChart is used to connect with patients for Virtual Visits (Telemedicine).  Patients are able to view lab/test results, encounter notes, upcoming appointments, etc.  Non-urgent messages can be sent to your provider as well.   To learn more about what you can do with MyChart, go  to [email or web address].    Your next appointment:   Follow up as need with Dr Marlou Porch after the above testing.  Thank you for choosing [facility]!!

## 2021-04-25 NOTE — Progress Notes (Signed)
Cardiology Office Note:    Date:  04/25/2021   ID:  [name], DOB Jun 09, 1971, MRN [id number]  PCP:  Carlena Hurl, PA-C   [facility] Providers Cardiologist:  None     Referring MD: Carlena Hurl, PA-C     History of Present Illness:    Kristy [name] is a 50 y.o. female here for the evaluation of coronary artery disease at the request of  Chana Bode.  CT scan of the chest was done on 08/07/2020 which showed coronary artery disease with evidence in the right coronary artery.  Occasioinal chest tightness. Brother at 60 had MI. Mom and DAD had one.     Past Medical History:  Diagnosis Date   Allergy    BRCA negative 11/2012, 4/19   BRCA neg 2014; MyRisk update neg except POLE VUS 4/19   Dysrhythmia    a. 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 pylori infection 10/2010   H/O exercise stress test 2018   Increased risk of breast cancer 11/2012; 4/19   5/14 IBIS=25.6%; 4/19 IBIS=25.8%   Iron deficiency anemia, unspecified    history of, normal hemoglobin 06/2019   Obesity    Seasonal allergies    ragweed, tree pollen, mild and dust per testing 08/2012   Unspecified vitamin D deficiency    Vitamin D deficiency 2017    Past Surgical History:  Procedure Laterality Date   BREAST BIOPSY Right 1980s   neg   [place] N/A 09/14/2015   Procedure: DILATATION & CURETTAGE/HYSTEROSCOPY WITH NOVASURE ABLATION;  Surgeon: Gae Dry, MD;  Location: [facility] ORS;  Service: Gynecology;  Laterality: N/A;   IUD REMOVAL N/A 09/14/2015   Procedure: INTRAUTERINE DEVICE (IUD) REMOVAL;  Surgeon: Gae Dry, MD;  Location: [facility] OR[facility];  Service: Gynecology;  Laterality: N/A;   LAPAROSCOPIC BILATERAL SALPINGECTOMY Bilateral 09/14/2015   Procedure: LAPAROSCOPIC BILATERAL  SALPINGECTOMY;  Surgeon: Gae Dry, MD;  Location: [facility] OR[facility];  Service: Gynecology;  Laterality: Bilateral;   TUBAL LIGATION Bilateral 09/14/2015   Procedure: BILATERAL TUBAL LIGATION;  Surgeon: Gae Dry, MD;  Location: [facility];  Service: Gynecology;  Laterality: Bilateral;   VULVAR LESION REMOVAL N/A 09/14/2015   Procedure: VULVAR LESION;  Surgeon: Gae Dry, MD;  Location: [facility];  Service: Gynecology;  Laterality: N/A;   WISDOM TOOTH EXTRACTION      Current Medications: Current Meds  Medication Sig   albuterol (VENTOLIN HFA) 108 (90 Base) MCG/ACT inhaler Inhale 2 puffs into the lungs every 6 (six) hours as needed for wheezing or shortness of breath.   azelastine (ASTELIN) 0.1 % nasal spray Place 2 sprays into both nostrils 2 (two) times daily. Use in each nostril as directed   Azelastine-Fluticasone (DYMISTA) 137-50 MCG/ACT SUSP Place 2 sprays into both nostrils 1 day or 1 dose.   cetirizine (ZYRTEC) 10 MG tablet Take 1 tablet (10 mg total) by mouth 2 (two) times daily.   famotidine (PEPCID) 20 MG tablet One after supper   Liraglutide -Weight Management (SAXENDA) 18 MG/3ML SOPN Inject 3 mg into the skin daily. Start 0.447m daily x 1 week, then increase to 1.21mdaily x 1wk, then 1.47m42maily x 1 wk, then 3mg3mily   omeprazole (PRILOSEC) 40 MG capsule TAKE 1 CAPSULE BY MOUTH EVERY DAY  rosuvastatin (CRESTOR) 10 MG tablet Take 1 tablet (10 mg total) by mouth daily.   Current Facility-Administered Medications for the 04/25/21 encounter (Office Visit) with Jerline Pain, MD  Medication   0.9 %  sodium chloride infusion     Allergies:   Iodinated diagnostic agents   Social History   Socioeconomic History   Marital status: Single    Spouse name: Not on file   Number of children: Not on file   Years of education: Not on file   Highest education level: Not on file  Occupational History   Not on file  Tobacco Use   Smoking status: Never   Smokeless tobacco: Never   Vaping Use   Vaping Use: Never used  Substance and Sexual Activity   Alcohol use: No    Comment: maybe one drink a year   Drug use: No   Sexual activity: Yes    Birth control/protection: Surgical    Comment: reports ablation and partner had vasectomy.   Other Topics Concern   Not on file  Social History Narrative   In relationship x 6 mo, divorced, 1 child age 89 yo, exercise with classes, running, works as a Surveyor, mining of Radio broadcast assistant Strain: Not on Art therapist Insecurity: Not on file  Transportation Needs: Not on file  Physical Activity: Not on file  Stress: Not on file  Social Connections: Not on file     Family History: The patient's family history includes Alcohol abuse in her father; Breast cancer (age of onset: 101) in her mother; Breast cancer (age of onset: 70) in her maternal grandmother; CAD in her brother; Cancer in her father; Esophageal cancer in her father; Heart disease (age of onset: 20) in her mother; Heart disease (age of onset: 46) in her father; Hypertension in her brother, brother, and sister. There is no history of Diabetes, Stroke, Colon cancer, Colon polyps, Stomach cancer, or Rectal cancer.  ROS:   Please see the history of present illness.     All other systems reviewed and are negative.  EKGs/Labs/Other Studies Reviewed:    The following studies were reviewed today:  Exercise treadmill test 2018-low risk  EKG: 01/26/2021-sinus rhythm 82 no other significant abnormalities.  Recent Labs: 08/02/2020: Hemoglobin 12.7; Platelets 227.0  Recent Lipid Panel    Component Value Date/Time   CHOL 207 (H) 04/06/2019 0933   TRIG 94 04/06/2019 0933   HDL 52 04/06/2019 0933   CHOLHDL 4.0 04/06/2019 0933   CHOLHDL 3.0 02/06/2017 0823   VLDL 14 02/06/2017 0823   LDLCALC 138 (H) 04/06/2019 0933     Risk Assessment/Calculations:          Physical Exam:    VS:  BP (!) 150/80 (BP Location: Left Arm, Patient  Position: Sitting, Cuff Size: Normal)   Pulse 82   Ht $R'5\' 5"'Na$  (1.651 m)   Wt 230 lb (104.3 kg)   SpO2 97%   BMI 38.27 kg/m     Wt Readings from Last 3 Encounters:  04/25/21 230 lb (104.3 kg)  04/24/21 226 lb (102.5 kg)  02/21/21 222 lb 9.6 oz (101 kg)     GEN:  Well nourished, well developed in no acute distress HEENT: Normal NECK: No JVD; No carotid bruits LYMPHATICS: No lymphadenopathy CARDIAC: RRR, no murmurs, rubs, gallops RESPIRATORY:  Clear to auscultation without rales, wheezing or rhonchi  ABDOMEN: Soft, non-tender, non-distended MUSCULOSKELETAL:  No edema; No deformity  SKIN: Warm and  dry NEUROLOGIC:  Alert and oriented x 3 PSYCHIATRIC:  Normal affect   ASSESSMENT:    1. Coronary artery disease involving native coronary artery of native heart without angina pectoris   2. Chest pain of uncertain etiology   3. Family history of premature CAD    PLAN:    In order of problems listed above:  Coronary artery disease involving native heart without angina pectoris Coronary artery calcification seen in the right coronary artery on CT scan personally reviewed.  Showed her the pictures.  Agree with Crestor 10 mg once a day.  Chana Bode is rechecking her lipid panel soon.  We would like her LDL to be less than 70.  A moderate intensity statin, Crestor 20 would make sense eventually.  In order to evaluate her coronary arteries more thoroughly, we will go ahead and proceed with cardiac CT scan with contrast.  She has had contrast allergy in the past with itchiness after her contrast administration.  We will go ahead and premedicate her.  I will also give her metoprolol tartrate 100 mg prior to scan given her heart rate of 82 and blood pressure 150/80.    Chest pain of uncertain etiology Checking coronary CT scan.  Continue with Crestor.  We will also add 81 mg of aspirin.  Watch for any signs of bleeding.  Family history of premature CAD Checking coronary CT       Medication Adjustments/Labs and Tests Ordered: Current medicines are reviewed at length with the patient today.  Concerns regarding medicines are outlined above.  No orders of the defined types were placed in this encounter.  No orders of the defined types were placed in this encounter.   Patient Instructions  Medication Instructions:  Please start Asprin 81 mg a day. Continue all other medications as listed.  The current medical regimen is effective;  continue present plan and medications.  *If you need a refill on your cardiac medications before your next appointment, please call your pharmacy*  Testing/Procedures:    Your cardiac CT will be scheduled at:   [facility] [address] [place], [place] [address] [phone]  Please arrive at the [facility] main entrance (entrance A) of [facility] 30 minutes prior to test start time. Proceed to the [facility] Radiology Department (first floor) to check-in and test prep.  Please follow these instructions carefully (unless otherwise directed):  On the Night Before the Test: Be sure to Drink plenty of water. Do not consume any caffeinated/decaffeinated beverages or chocolate 12 hours prior to your test. Do not take any antihistamines 12 hours prior to your test. If the patient has contrast allergy: Patient will need a prescription for Prednisone and very clear instructions (as follows): Prednisone 50 mg - take 13 hours prior to test Take another Prednisone 50 mg 7 hours prior to test Take another Prednisone 50 mg 1 hour prior to test Take Benadryl 50 mg 1 hour prior to test Patient must complete all four doses of above prophylactic medications. Patient will need a ride after test due to Benadryl.  On the Day of the Test: Drink plenty of water until 1 hour prior to the test. Do not eat any food 4 hours prior to the test. You may take your regular medications prior to the test.  Take  metoprolol (Lopressor) two hours prior to test. HOLD Furosemide/Hydrochlorothiazide morning of the test. FEMALES- please wear underwire-free bra if available, avoid dresses & tight clothing  After  the Test: Drink plenty of water. After receiving IV contrast, you may experience a mild flushed feeling. This is normal. On occasion, you may experience a mild rash up to 24 hours after the test. This is not dangerous. If this occurs, you can take Benadryl 25 mg and increase your fluid intake. If you experience trouble breathing, this can be serious. If it is severe call 911 IMMEDIATELY. If it is mild, please call our office.  Please allow 2-4 weeks for scheduling of routine cardiac CTs. Some insurance companies require a pre-authorization which may delay scheduling of this test.   For non-scheduling related questions, please contact the cardiac imaging nurse navigator should you have any questions/concerns: Marchia Bond, Cardiac Imaging Nurse Navigator Gordy Clement, Cardiac Imaging Nurse Navigator [facility] Direct Office Dial: [phone]   For scheduling needs, including cancellations and rescheduling, please call [place], [phone].  Follow-Up: At [facility], you and your health needs are our priority.  As part of our continuing mission to provide you with exceptional heart care, we have created designated Provider Care Teams.  These Care Teams include your primary Cardiologist (physician) and Advanced Practice Providers (APPs -  Physician Assistants and Nurse Practitioners) who all work together to provide you with the care you need, when you need it.  We recommend signing up for the patient portal called "MyChart".  Sign up information is provided on this After Visit Summary.  MyChart is used to connect with patients for Virtual Visits (Telemedicine).  Patients are able to view lab/test results, encounter notes, upcoming appointments, etc.  Non-urgent  messages can be sent to your provider as well.   To learn more about what you can do with MyChart, go to [email or web address].    Your next appointment:   Follow up as need with Dr Marlou Porch after the above testing.  Thank you for choosing [facility]!!     Signed, Candee Furbish, MD  04/25/2021 4:10 PM    [facility]

## 2021-04-25 NOTE — Assessment & Plan Note (Signed)
Checking coronary CT

## 2021-04-25 NOTE — Assessment & Plan Note (Signed)
Checking coronary CT scan.  Continue with Crestor.  We will also add 81 mg of aspirin.  Watch for any signs of bleeding.

## 2021-05-01 LAB — CYTOLOGY - PAP
Comment: NEGATIVE
Diagnosis: UNDETERMINED — AB
High risk HPV: NEGATIVE

## 2021-05-02 ENCOUNTER — Other Ambulatory Visit: Payer: Self-pay | Admitting: Medical

## 2021-05-02 ENCOUNTER — Ambulatory Visit (INDEPENDENT_AMBULATORY_CARE_PROVIDER_SITE_OTHER): Payer: 59 | Admitting: Medical

## 2021-05-02 ENCOUNTER — Encounter: Payer: Self-pay | Admitting: Medical

## 2021-05-02 ENCOUNTER — Other Ambulatory Visit: Payer: Self-pay

## 2021-05-02 VITALS — BP 130/92 | HR 91 | Wt 227.4 lb

## 2021-05-02 DIAGNOSIS — R7301 Impaired fasting glucose: Secondary | ICD-10-CM

## 2021-05-02 DIAGNOSIS — Z0000 Encounter for general adult medical examination without abnormal findings: Secondary | ICD-10-CM | POA: Insufficient documentation

## 2021-05-02 DIAGNOSIS — Z13220 Encounter for screening for lipoid disorders: Secondary | ICD-10-CM

## 2021-05-02 DIAGNOSIS — Z6837 Body mass index (BMI) 37.0-37.9, adult: Secondary | ICD-10-CM | POA: Diagnosis not present

## 2021-05-02 DIAGNOSIS — Z1329 Encounter for screening for other suspected endocrine disorder: Secondary | ICD-10-CM

## 2021-05-02 DIAGNOSIS — Z131 Encounter for screening for diabetes mellitus: Secondary | ICD-10-CM

## 2021-05-02 NOTE — Progress Notes (Signed)
Subjective:  Kristy [name] is a 50 y.o. female who presents for Chief Complaint  Patient presents with   Obesity    Switched to [place]     Here for f/u on obesity and efforts with weight loss.  Exercise - walking, at least 3 times per week.  No weights.  Has been avoiding breast and pasta, sugar.   Trying to make healthy choices.  Got ozmepic initially but insurance would cover this.  She used the initial ozempic and then was out of medicaiton 6 weeks.  They would cover Saxenda and she just started this medicaiton this week.  She had no problems with ozempic.      Needs labs done as f/u from her gynecology visit and physical recently.  She was nonfasting at the recent gyn visit.  No other aggravating or relieving factors.    No other c/o.  Past Medical History:  Diagnosis Date   Allergy    BRCA negative 11/2012, 4/19   BRCA neg 2014; MyRisk update neg except POLE VUS 4/19   Dysrhythmia    a. 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 pylori infection 10/2010   H/O exercise stress test 2018   Increased risk of breast cancer 11/2012; 4/19   5/14 IBIS=25.6%; 4/19 IBIS=25.8%   Iron deficiency anemia, unspecified    history of, normal hemoglobin 06/2019   Obesity    Seasonal allergies    ragweed, tree pollen, mild and dust per testing 08/2012   Unspecified vitamin D deficiency    Vitamin D deficiency 2017   Current Outpatient Medications on File Prior to Visit  Medication Sig Dispense Refill   albuterol (VENTOLIN HFA) 108 (90 Base) MCG/ACT inhaler Inhale 2 puffs into the lungs every 6 (six) hours as needed for wheezing or shortness of breath. 8 g 0   aspirin EC 81 MG tablet Take 1 tablet (81 mg total) by mouth daily. Swallow whole. 90 tablet 3   cetirizine (ZYRTEC) 10 MG tablet Take 1 tablet (10 mg total) by mouth 2 (two) times daily. 60 tablet 3    famotidine (PEPCID) 20 MG tablet One after supper 30 tablet 11   Liraglutide -Weight Management (SAXENDA) 18 MG/3ML SOPN Inject 3 mg into the skin daily. Start 0.547m daily x 1 week, then increase to 1.256mdaily x 1wk, then 1.47m59maily x 1 wk, then 3mg62mily 9 mL 0   omeprazole (PRILOSEC) 40 MG capsule TAKE 1 CAPSULE BY MOUTH EVERY DAY 90 capsule 3   rosuvastatin (CRESTOR) 10 MG tablet Take 1 tablet (10 mg total) by mouth daily. 90 tablet 3   metoprolol tartrate (LOPRESSOR) 100 MG tablet Take 1 tablet (100 mg total) by mouth once for 1 dose. Take 1 tablet 2 hours before your CT. (Patient not taking: Reported on 05/02/2021) 1 tablet 0   predniSONE (DELTASONE) 50 MG tablet Take 1 tablet 13 hours before, 7 hours before and 1 hour before your CT (Patient not taking: Reported on 05/02/2021) 3 tablet 0   Current Facility-Administered Medications on File Prior to Visit  Medication Dose Route Frequency Provider Last Rate Last Admin   0.9 %  sodium chloride infusion  500 mL Intravenous Once StarLadene Artist         The following portions of the patient's history were reviewed and updated as appropriate: allergies, current medications,  past family history, past medical history, past social history, past surgical history and problem list.  ROS Otherwise as in subjective above  Objective: BP (!) 130/92 (BP Location: Right Arm, Patient Position: Sitting)   Pulse 91   Wt 227 lb 6.4 oz (103.1 kg)   BMI 37.84 kg/m   Wt Readings from Last 3 Encounters:  05/02/21 227 lb 6.4 oz (103.1 kg)  04/25/21 230 lb (104.3 kg)  04/24/21 226 lb (102.5 kg)   General appearance: alert, no distress, well developed, well nourished    Assessment: Encounter Diagnoses  Name Primary?   BMI 37.0-37.9, adult Yes   Impaired fasting blood sugar    Screening for thyroid disorder    Encounter for annual physical exam    Screening for diabetes mellitus    Screening for lipid disorders      Plan: Counseled on  diet, exercise recommendations and strategies, weight loss strategies, she has already begun [place].  We discussed a gradual taper up on the dose.  She completed 1 round of Ozempic without side effect.  So far no side effects with Saxenda.  We discussed proper use of medicine, risk and benefits of medication.  We discussed water intake, goalsetting.  Screening labs today as below   [name] was seen today for obesity.  Diagnoses and all orders for this visit:  BMI 37.0-37.9, adult  Impaired fasting blood sugar -     Hemoglobin A1c  Screening for thyroid disorder -     TSH  Encounter for annual physical exam -     TSH -     Hemoglobin A1c -     Lipid panel -     Comprehensive metabolic panel  Screening for diabetes mellitus -     Hemoglobin A1c  Screening for lipid disorders -     Lipid panel   Follow up: pending labs

## 2021-05-03 LAB — COMPREHENSIVE METABOLIC PANEL
ALT: 23 IU/L (ref 0–32)
AST: 21 IU/L (ref 0–40)
Albumin/Globulin Ratio: 1.4 (ref 1.2–2.2)
Albumin: 4.2 g/dL (ref 3.8–4.8)
Alkaline Phosphatase: 64 IU/L (ref 44–121)
BUN/Creatinine Ratio: 11 (ref 9–23)
BUN: 9 mg/dL (ref 6–24)
Bilirubin Total: 0.3 mg/dL (ref 0.0–1.2)
CO2: 22 mmol/L (ref 20–29)
Calcium: 9.5 mg/dL (ref 8.7–10.2)
Chloride: 102 mmol/L (ref 96–106)
Creatinine, Ser: 0.84 mg/dL (ref 0.57–1.00)
Globulin, Total: 2.9 g/dL (ref 1.5–4.5)
Glucose: 81 mg/dL (ref 70–99)
Potassium: 4.5 mmol/L (ref 3.5–5.2)
Sodium: 139 mmol/L (ref 134–144)
Total Protein: 7.1 g/dL (ref 6.0–8.5)
eGFR: 86 mL/min/{1.73_m2} (ref >59)

## 2021-05-03 LAB — LIPID PANEL
Chol/HDL Ratio: 3.1 ratio (ref 0.0–4.4)
Cholesterol, Total: 178 mg/dL (ref 100–199)
HDL: 58 mg/dL (ref >39)
LDL Chol Calc (NIH): 107 mg/dL — ABNORMAL HIGH (ref 0–99)
Triglycerides: 68 mg/dL (ref 0–149)
VLDL Cholesterol Cal: 13 mg/dL (ref 5–40)

## 2021-05-03 LAB — HEMOGLOBIN A1C
Est. average glucose Bld gHb Est-mCnc: 117 mg/dL
Hgb A1c MFr Bld: 5.7 % — ABNORMAL HIGH (ref 4.8–5.6)

## 2021-05-03 LAB — TSH: TSH: 1.89 u[IU]/mL (ref 0.450–4.500)

## 2021-05-04 NOTE — Telephone Encounter (Signed)
P.A. approved til 08/25/21, activated discount card went thru for $25, pt informed

## 2021-05-10 NOTE — Addendum Note (Signed)
Addended by: Shellia Cleverly on: 05/10/2021 01:31 PM   Modules accepted: Orders

## 2021-05-17 ENCOUNTER — Telehealth (HOSPITAL_COMMUNITY): Payer: Self-pay | Admitting: Emergency Medicine

## 2021-05-17 NOTE — Telephone Encounter (Signed)
Reaching out to patient to offer assistance regarding upcoming cardiac imaging study; pt verbalizes understanding of appt date/time, parking situation and where to check in, pre-test NPO status and medications ordered, and verified current allergies; name and call back number provided for further questions should they arise Marchia Bond RN Navigator Cardiac Imaging [facility] [phone] office [phone] cell  Reviewed times to take 13 hr prep 100mg  metoprolol tart Denies iv issues

## 2021-05-21 ENCOUNTER — Ambulatory Visit (HOSPITAL_COMMUNITY)
Admission: RE | Admit: 2021-05-21 | Discharge: 2021-05-21 | Disposition: A | Discharge status: Home / Self Care | Payer: 59 | Source: Ambulatory Visit | Attending: Cardiology | Admitting: Cardiology

## 2021-05-21 ENCOUNTER — Other Ambulatory Visit: Payer: Self-pay

## 2021-05-21 ENCOUNTER — Other Ambulatory Visit: Payer: Self-pay | Admitting: Cardiology

## 2021-05-21 DIAGNOSIS — R931 Abnormal findings on diagnostic imaging of heart and coronary circulation: Secondary | ICD-10-CM

## 2021-05-21 DIAGNOSIS — R072 Precordial pain: Secondary | ICD-10-CM | POA: Insufficient documentation

## 2021-05-21 DIAGNOSIS — I2510 Atherosclerotic heart disease of native coronary artery without angina pectoris: Secondary | ICD-10-CM | POA: Insufficient documentation

## 2021-05-21 DIAGNOSIS — R079 Chest pain, unspecified: Secondary | ICD-10-CM | POA: Insufficient documentation

## 2021-05-21 MED ORDER — METOPROLOL TARTRATE 5 MG/5ML IV SOLN
INTRAVENOUS | Status: AC
  Filled 2021-05-21: qty 10

## 2021-05-21 MED ORDER — NITROGLYCERIN 0.4 MG SL SUBL
SUBLINGUAL_TABLET | SUBLINGUAL | Status: AC
  Filled 2021-05-21: qty 2

## 2021-05-21 MED ORDER — DILTIAZEM HCL 25 MG/5ML IV SOLN
20.0000 mg | Freq: Once | INTRAVENOUS | Status: AC
  Administered 2021-05-21: 10 mg via INTRAVENOUS

## 2021-05-21 MED ORDER — DILTIAZEM HCL 25 MG/5ML IV SOLN
INTRAVENOUS | Status: AC
  Filled 2021-05-21: qty 5

## 2021-05-21 MED ORDER — IOHEXOL 350 MG/ML SOLN
95.0000 mL | Freq: Once | INTRAVENOUS | Status: AC | PRN
  Administered 2021-05-21: 95 mL via INTRAVENOUS

## 2021-05-21 MED ORDER — METOPROLOL TARTRATE 5 MG/5ML IV SOLN
10.0000 mg | INTRAVENOUS | Status: DC | PRN
  Administered 2021-05-21 (×2): 10 mg via INTRAVENOUS

## 2021-05-21 MED ORDER — NITROGLYCERIN 0.4 MG SL SUBL
0.8000 mg | SUBLINGUAL_TABLET | Freq: Once | SUBLINGUAL | Status: AC
  Administered 2021-05-21: 0.8 mg via SUBLINGUAL

## 2021-05-22 ENCOUNTER — Ambulatory Visit (HOSPITAL_COMMUNITY): Payer: 59

## 2021-05-22 ENCOUNTER — Telehealth: Payer: Self-pay | Admitting: *Deleted

## 2021-05-22 DIAGNOSIS — I2510 Atherosclerotic heart disease of native coronary artery without angina pectoris: Secondary | ICD-10-CM

## 2021-05-22 DIAGNOSIS — Z79899 Other long term (current) drug therapy: Secondary | ICD-10-CM

## 2021-05-22 MED ORDER — ROSUVASTATIN CALCIUM 20 MG PO TABS: 20.0000 mg | ORAL_TABLET | Freq: Every day | ORAL | 3 refills | Status: DC

## 2021-05-22 NOTE — Telephone Encounter (Signed)
-----   Message from Jerline Pain, MD sent at 05/22/2021 11:27 AM EST ----- Moderate nonobstructive coronary artery disease. No need for cardiac stent. Recommend increasing Crestor from 10 mg up to 20 mg a day.  Repeat lipid panel in 3 months with ALT.  Diet, exercise.  Candee Furbish, MD

## 2021-05-22 NOTE — Telephone Encounter (Signed)
The patient has been notified of the result and verbalized understanding.  All questions (if any) were answered. Darrell Jewel, RN 05/22/2021 12:56 PM    Crestor increased. Labs ordered and scheduled.

## 2021-05-24 ENCOUNTER — Other Ambulatory Visit: Payer: Self-pay | Admitting: Medical

## 2021-06-12 ENCOUNTER — Other Ambulatory Visit: Payer: Self-pay

## 2021-06-12 ENCOUNTER — Ambulatory Visit: Payer: 59 | Admitting: Medical

## 2021-06-12 VITALS — BP 120/80 | HR 85 | Wt 230.0 lb

## 2021-06-12 DIAGNOSIS — I2510 Atherosclerotic heart disease of native coronary artery without angina pectoris: Secondary | ICD-10-CM

## 2021-06-12 DIAGNOSIS — I493 Ventricular premature depolarization: Secondary | ICD-10-CM

## 2021-06-12 DIAGNOSIS — Z79899 Other long term (current) drug therapy: Secondary | ICD-10-CM

## 2021-06-12 DIAGNOSIS — R7301 Impaired fasting glucose: Secondary | ICD-10-CM | POA: Diagnosis not present

## 2021-06-12 DIAGNOSIS — Z6837 Body mass index (BMI) 37.0-37.9, adult: Secondary | ICD-10-CM

## 2021-06-12 DIAGNOSIS — R002 Palpitations: Secondary | ICD-10-CM

## 2021-06-12 DIAGNOSIS — R232 Flushing: Secondary | ICD-10-CM

## 2021-06-12 DIAGNOSIS — R0683 Snoring: Secondary | ICD-10-CM

## 2021-06-12 MED ORDER — CLONIDINE HCL 0.1 MG PO TABS: 0.1000 mg | ORAL_TABLET | Freq: Every day | ORAL | 1 refills | Status: DC

## 2021-06-12 NOTE — Addendum Note (Signed)
Addended by: Carlena Hurl on: 06/12/2021 01:40 PM   Modules accepted: Orders

## 2021-06-12 NOTE — Patient Instructions (Signed)
Palpitations that seem to be associated with hot flashes, likely perimenopausal.  She has history of ablation and last menstrual period was several years ago. Avoid or limit caffeine and alcohol Limit salt Reduced risk for possible Begin trial of clonidine at bedtime only to help with hot flashes.  This is a blood pressure medicine but we are using it for hot flashes in your case.  Do not skip doses though, and take it every night at bedtime Try to drink a good amount of water such as 80 to 100 ounces daily Limit or avoid spicy foods  History of PVCs, palpitations Stop Singulair for now as it could be aggravating some of your symptoms Monitor your apple watch for abnormal heartbeats.  Normal is between 70 and 100 bpm at rest In the future if palpitations continue we may need back propanolol which was prescribed in the past to help with palpitations  Impaired glucose-continue efforts to eat healthy low-fat low sugar diet and exercise regularly  BMI greater than 37-referral to weight loss study as discussed  CAD-recent diagnosis based on CT coronary score in early November.  Continue statin that was recently started.  Cardiology bumped her up to 20 mg daily.  Updated labs today  Snoring-consider sleep study.  For now continue efforts to lose weight and continue with referral to weight /obesity study

## 2021-06-12 NOTE — Progress Notes (Signed)
Subjective:  Kristy [name] is a 50 y.o. female who presents for Chief Complaint  Patient presents with   6 week follow-up    6 week follow-up on sugars and cholesterol. Fasting. Declines flu shot     Here for f/u on obesity and efforts with weight loss, lipids  She had a CT coronary calcium score recently that was abnormal.  She was started on statin and cardiology increase the dose to 20 mg which she is taking.  She is here for fasting labs since starting cholesterol medication.  She has been on 20 mg dose since about November 8.  She does not think she is having side effects from the medicine that she is having some general aches of her arms and feet.  No thigh pain or thigh aches  Exercise - walking, at least 3 times per week.    Obesity-we had started her on trial of Saxenda but after she started having some symptoms of aches and hot flashes and palpitations, she quit this medicine temporarily.  She last used this probably a month ago  She thinks she is in menopause.  Her last period was age 21 when she had ablation and tubal ligation.  She is getting pretty bad hot flashes particularly at night lately.  Sleep is sometimes not good.  She does snore but no witnessed apnea.  No prior sleep study  She does check her blood pressures at home.  Recently she got an elevated reading at random but most the time her pressures are normal.  Recent elevated reading was 144/93  Aches in arms and hands, aches in feet.  No joint swelling.  Occasionally gets stiff in morning but this resolves once she gets moving.  She has been getting some heart palpitations occasionally.  She feels like this is related to her hot flashes.  She has been other times and does not feel particularly stressed.  No other aggravating or relieving factors.    No other c/o.  Past Medical History:  Diagnosis Date   Allergy    BRCA negative 11/2012, 4/19   BRCA neg 2014; MyRisk update neg except POLE VUS 4/19    Dysrhythmia    a. 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 pylori infection 10/2010   H/O exercise stress test 2018   Increased risk of breast cancer 11/2012; 4/19   5/14 IBIS=25.6%; 4/19 IBIS=25.8%   Iron deficiency anemia, unspecified    history of, normal hemoglobin 06/2019   Obesity    Seasonal allergies    ragweed, tree pollen, mild and dust per testing 08/2012   Unspecified vitamin D deficiency    Vitamin D deficiency 2017   Current Outpatient Medications on File Prior to Visit  Medication Sig Dispense Refill   albuterol (VENTOLIN HFA) 108 (90 Base) MCG/ACT inhaler Inhale 2 puffs into the lungs every 6 (six) hours as needed for wheezing or shortness of breath. 8 g 0   aspirin EC 81 MG tablet Take 1 tablet (81 mg total) by mouth daily. Swallow whole. 90 tablet 3   cetirizine (ZYRTEC) 10 MG tablet Take 1 tablet (10 mg total) by mouth 2 (two) times daily. 60 tablet 3   famotidine (PEPCID) 20 MG tablet One after supper 30 tablet 11   omeprazole (PRILOSEC) 40 MG capsule TAKE 1 CAPSULE BY MOUTH EVERY DAY 90 capsule  3   rosuvastatin (CRESTOR) 20 MG tablet Take 1 tablet (20 mg total) by mouth daily. 90 tablet 3   No current facility-administered medications on file prior to visit.     The following portions of the patient's history were reviewed and updated as appropriate: allergies, current medications, past family history, past medical history, past social history, past surgical history and problem list.  ROS Otherwise as in subjective above    Objective: BP 110/70   Pulse 85   Wt 230 lb (104.3 kg)   BMI 38.27 kg/m   BP Readings from Last 3 Encounters:  06/12/21 110/70  05/21/21 109/77  05/02/21 (!) 130/92    Wt Readings from Last 3 Encounters:  06/12/21 230 lb (104.3 kg)  05/02/21 227 lb 6.4 oz (103.1 kg)  04/25/21 230 lb (104.3 kg)   General  appearance: alert, no distress, well developed, well nourished Heart: Regular rate and rhythm, no murmur normal S1 and S2 Lungs clear Legs and arms nontender in general no obvious swelling, no obvious deformity Arms and legs neurovascularly intact Extremities without edema     Assessment: Encounter Diagnoses  Name Primary?   Palpitation Yes   PVC (premature ventricular contraction)    Class 2 severe obesity with serious comorbidity and body mass index (BMI) of 37.0 to 37.9 in adult, unspecified obesity type (HCC)    Impaired fasting blood sugar    Heart palpitations    Coronary artery disease involving native coronary artery of native heart without angina pectoris    Snoring    Hot flashes       Plan: Palpitations that seem to be associated with hot flashes, likely perimenopausal.  She has history of ablation and last menstrual period was several years ago. Avoid or limit caffeine and alcohol Limit salt Reduced risk for possible Begin trial of clonidine at bedtime only to help with hot flashes.  This is a blood pressure medicine but we are using it for hot flashes in your case.  Do not skip doses though, and take it every night at bedtime Try to drink a good amount of water such as 80 to 100 ounces daily Limit or avoid spicy foods Follow-up with cardiology soon as planned  I reviewed her recent CT coronary score that was abnormal showing some CAD, reviewed prior stress test in August 2018 that was normal  History of PVCs, palpitations Stop Singulair for now as it could be aggravating some of your symptoms Monitor your apple watch for abnormal heartbeats.  Normal is between 70 and 100 bpm at rest In the future if palpitations continue we may need back propanolol which was prescribed in the past to help with palpitations  Impaired glucose-continue efforts to eat healthy low-fat low sugar diet and exercise regularly  BMI greater than 37-referral to weight loss study as  discussed.  She stopped Saxenda a month ago when she was started to get palpitations.  CAD-recent diagnosis based on CT coronary score in early November.  Continue statin that was recently started.  Cardiology bumped her up to 20 mg daily.  Updated labs today  Snoring-consider sleep study.  For now continue efforts to lose weight and continue with referral to weight /obesity study    [place] was seen today for 6 week follow-up.  Diagnoses and all orders for this visit:  Palpitation  PVC (premature ventricular contraction)  Class 2 severe obesity with serious comorbidity and body mass index (BMI) of 37.0 to 37.9 in adult, unspecified obesity  type ([place])  Impaired fasting blood sugar  Heart palpitations  Coronary artery disease involving native coronary artery of native heart without angina pectoris  Snoring  Hot flashes  Other orders -     cloNIDine (CATAPRES) 0.1 MG tablet; Take 1 tablet (0.1 mg total) by mouth at bedtime.    Follow up: pending labs

## 2021-06-13 LAB — LIPID PANEL
Chol/HDL Ratio: 2.8 ratio (ref 0.0–4.4)
Cholesterol, Total: 168 mg/dL (ref 100–199)
HDL: 61 mg/dL (ref >39)
LDL Chol Calc (NIH): 95 mg/dL (ref 0–99)
Triglycerides: 64 mg/dL (ref 0–149)
VLDL Cholesterol Cal: 12 mg/dL (ref 5–40)

## 2021-06-13 LAB — ALT: ALT: 20 IU/L (ref 0–32)

## 2021-06-27 ENCOUNTER — Ambulatory Visit: Payer: 59 | Admitting: Allergy

## 2021-07-03 ENCOUNTER — Encounter: Payer: Self-pay | Admitting: Medical

## 2021-07-03 ENCOUNTER — Other Ambulatory Visit: Payer: Self-pay

## 2021-07-03 ENCOUNTER — Ambulatory Visit: Payer: 59 | Admitting: Medical

## 2021-07-03 VITALS — BP 122/88 | HR 67 | Temp 97.7°F | Wt 232.4 lb

## 2021-07-03 DIAGNOSIS — R058 Other specified cough: Secondary | ICD-10-CM

## 2021-07-03 DIAGNOSIS — R0602 Shortness of breath: Secondary | ICD-10-CM | POA: Diagnosis not present

## 2021-07-03 DIAGNOSIS — J4540 Moderate persistent asthma, uncomplicated: Secondary | ICD-10-CM | POA: Diagnosis not present

## 2021-07-03 DIAGNOSIS — R062 Wheezing: Secondary | ICD-10-CM | POA: Diagnosis not present

## 2021-07-03 DIAGNOSIS — K219 Gastro-esophageal reflux disease without esophagitis: Secondary | ICD-10-CM

## 2021-07-03 DIAGNOSIS — J3089 Other allergic rhinitis: Secondary | ICD-10-CM

## 2021-07-03 DIAGNOSIS — R0789 Other chest pain: Secondary | ICD-10-CM

## 2021-07-03 LAB — POC COVID19 BINAXNOW: SARS Coronavirus 2 Ag: NEGATIVE

## 2021-07-03 MED ORDER — AIRDUO DIGIHALER 232-14 MCG/ACT IN AEPB: 1.0000 | INHALATION_SPRAY | Freq: Two times a day (BID) | RESPIRATORY_TRACT | 0 refills | Status: DC

## 2021-07-03 MED ORDER — OMEPRAZOLE 40 MG PO CPDR: DELAYED_RELEASE_CAPSULE | ORAL | 1 refills | Status: DC

## 2021-07-03 MED ORDER — ALBUTEROL SULFATE HFA 108 (90 BASE) MCG/ACT IN AERS: 2.0000 | INHALATION_SPRAY | Freq: Four times a day (QID) | RESPIRATORY_TRACT | 1 refills | Status: DC | PRN

## 2021-07-03 MED ORDER — METHYLPREDNISOLONE ACETATE 80 MG/ML IJ SUSP
40.0000 mg | Freq: Once | INTRAMUSCULAR | Status: AC
  Administered 2021-07-03: 13:00:00 40 mg via INTRAMUSCULAR

## 2021-07-03 NOTE — Progress Notes (Signed)
Subjective:  Kristy [name] is a 50 y.o. female who presents for Chief Complaint  Patient presents with   Wheezing    Started after being around a dog on Saturday night, using albuterol inhaler and has gotten  worse with SHOB and cough r/t phlegm.      Here for shortness of breath.  Feels like it is allergy related.  She had allergy testing back in August.  She is allergic to dogs and cats and other things.  She was around a dog this past weekend and had immediate itching of the skin and shortness of breath.  She got away from the dog but the shortness of breath has persisted.  She had a similar reaction around abdominal year ago.  She is using her albuterol inhaler twice daily, Benadryl.  She went to urgent care 2 weeks ago and a chest x-ray that was reportedly normal.  They thought it was allergy related but her symptoms have not improved despite using allergy medicine acid reflux medicine.  She is on medicine for acid reflux.  She wonders if she needs further evaluation regarding acid reflux.  No history of DVT or PE.  She did travel to [place] this past weekend.  No calf pain no calf swelling.  No injury, no trauma.  No family history of blood clots or clotting disorder  She is no longer taking clonidine for hot flashes or sleep  No other aggravating or relieving factors.    No other c/o.  Past Medical History:  Diagnosis Date   Allergy    BRCA negative 11/2012, 4/19   BRCA neg 2014; MyRisk update neg except POLE VUS 4/19   Dysrhythmia    a. 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 pylori infection 10/2010   H/O exercise stress test 2018   Increased risk of breast cancer 11/2012; 4/19   5/14 IBIS=25.6%; 4/19 IBIS=25.8%   Iron deficiency anemia, unspecified    history of, normal hemoglobin 06/2019   Obesity    Seasonal allergies     ragweed, tree pollen, mild and dust per testing 08/2012   Unspecified vitamin D deficiency    Vitamin D deficiency 2017   Current Outpatient Medications on File Prior to Visit  Medication Sig Dispense Refill   aspirin EC 81 MG tablet Take 1 tablet (81 mg total) by mouth daily. Swallow whole. 90 tablet 3   cetirizine (ZYRTEC) 10 MG tablet Take 1 tablet (10 mg total) by mouth 2 (two) times daily. 60 tablet 3   famotidine (PEPCID) 20 MG tablet One after supper 30 tablet 11   rosuvastatin (CRESTOR) 20 MG tablet Take 1 tablet (20 mg total) by mouth daily. 90 tablet 3   No current facility-administered medications on file prior to visit.     The following portions of the patient's history were reviewed and updated as appropriate: allergies, current medications, past family history, past medical history, past social history, past surgical history and problem list.  ROS Otherwise as in subjective above     Objective: BP 122/88 (BP Location: Right Arm, Patient Position: Sitting)    Pulse 67    Temp 97.7 F (36.5 C) (Tympanic)    Wt 232 lb 6.4 oz (105.4 kg)    SpO2 99%    BMI 38.67 kg/m    Wt Readings from Last 3 Encounters:  07/03/21 232 lb 6.4 oz (105.4 kg)  06/12/21 230 lb (104.3 kg)  05/02/21 227 lb 6.4 oz (103.1 kg)    General appearance: alert, no distress, well developed, well nourished HEENT: normocephalic, sclerae anicteric, conjunctiva pink and moist, TMs pearly, nares patent, no discharge or erythema, pharynx normal Oral cavity: MMM, no lesions Neck: supple, no lymphadenopathy, no thyromegaly, no masses Heart: RRR, normal S1, S2, no murmurs Lungs: scattered faint wheezes, othewrise no rhonchi, or rales Abdomen: +bs, soft, non tender, non distended, no masses, no hepatomegaly, no splenomegaly Pulses: 2+ radial pulses, 2+ pedal pulses, normal cap refill Ext: no edema No longer tenderness, negative Homans   Assessment: Encounter Diagnoses  Name Primary?   SOB (shortness  of breath) Yes   Wheezing    Upper airway cough syndrome    Moderate persistent reactive airway disease without complication    Non-seasonal allergic rhinitis due to other allergic trigger    Gastroesophageal reflux disease, unspecified whether esophagitis present    Tightness in chest      Plan: We discussed her symptoms and possible causes.  I suspect a combination of acid reflux aggravating her airway as well as allergies.  Being around dog dander seem to her symptoms this past weekend.  No obvious major concern for DVT or PE.  I added air duo preventative inhaler today x 2 weeks.  Continue albuterol as needed every 4-6 hours.  For the next 2 weeks at least use daily allergy medicine and continue her acid reflux regimen.  Avoid acid reflux triggers.  Avoid allergens.  Also gave Depo-Medrol 40 mg IM injection today.  Discussed risk and benefits  of medication.  I will reach out to her gastroenterologist to see if they want to bring her back in for further eval or treatment recommendations regarding acid reflux  I reviewed recent CT imaging she had of her lungs and heart and January of this year as well as recently in November of this year  I reviewed her prior cardiology notes from October 2022  If not much improved in the next 3 days then call or recheck  [place] was seen today for wheezing.  Diagnoses and all orders for this visit:  SOB (shortness of breath) -     POC COVID-19 -     methylPREDNISolone acetate (DEPO-MEDROL) injection 40 mg  Wheezing -     POC COVID-19 -     methylPREDNISolone acetate (DEPO-MEDROL) injection 40 mg -     albuterol (VENTOLIN HFA) 108 (90 Base) MCG/ACT inhaler; Inhale 2 puffs into the lungs every 6 (six) hours as needed for wheezing or shortness of breath.  Upper airway cough syndrome  Moderate persistent reactive airway disease without complication  Non-seasonal allergic rhinitis due to other allergic trigger  Gastroesophageal reflux  disease, unspecified whether esophagitis present  Tightness in chest -     albuterol (VENTOLIN HFA) 108 (90 Base) MCG/ACT inhaler; Inhale 2 puffs into the lungs every 6 (six) hours as needed for wheezing or shortness of breath.  Other orders -     omeprazole (PRILOSEC) 40 MG capsule; TAKE 1 CAPSULE BY MOUTH EVERY DAY -     Fluticasone-Salmeterol,sensor, (AIRDUO DIGIHALER) 177-11 MCG/ACT AEPB; Inhale 1 puff into the lungs 2 times daily at 12 noon and 4 pm.   Follow up: call report in 3 days

## 2021-08-02 ENCOUNTER — Other Ambulatory Visit: Payer: Self-pay | Admitting: Medical

## 2021-08-21 ENCOUNTER — Other Ambulatory Visit: Payer: 59

## 2021-08-22 ENCOUNTER — Other Ambulatory Visit: Payer: 59 | Admitting: *Deleted

## 2021-08-22 ENCOUNTER — Other Ambulatory Visit: Payer: Self-pay

## 2021-08-22 ENCOUNTER — Other Ambulatory Visit: Payer: 59

## 2021-08-23 LAB — LIPID PANEL
Chol/HDL Ratio: 2.7 ratio (ref 0.0–4.4)
Cholesterol, Total: 170 mg/dL (ref 100–199)
HDL: 63 mg/dL (ref >39)
LDL Chol Calc (NIH): 97 mg/dL (ref 0–99)
Triglycerides: 49 mg/dL (ref 0–149)
VLDL Cholesterol Cal: 10 mg/dL (ref 5–40)

## 2021-08-23 LAB — ALT: ALT: 38 IU/L — ABNORMAL HIGH (ref 0–32)

## 2021-08-28 ENCOUNTER — Telehealth: Payer: Self-pay | Admitting: *Deleted

## 2021-08-28 DIAGNOSIS — Z79899 Other long term (current) drug therapy: Secondary | ICD-10-CM

## 2021-08-28 DIAGNOSIS — E785 Hyperlipidemia, unspecified: Secondary | ICD-10-CM

## 2021-08-28 NOTE — Telephone Encounter (Signed)
Per Dr Marlou Porch - he would like for you to be seen in our [facility] ran by our pharmacy team, here at our [place] office.  You will be contacted to be scheduled for this appointment.  If you have any questions please let us know.  Interestingly, LDL did not go down after increasing from 10 mg of Crestor to 20 mg of Crestor.  It is 97.  ALT is slightly elevated at 38.  Lets have her talk with our lipid clinic to discuss strategies on lowering LDL to below 70 given her coronary artery disease.  She may benefit from PCSK9 inhibitor.  Candee Furbish, MD  Thank you

## 2021-08-28 NOTE — Telephone Encounter (Signed)
Pt has been scheduled in the lipid clinic 09/25/2021.

## 2021-09-05 ENCOUNTER — Other Ambulatory Visit: Payer: Self-pay

## 2021-09-05 ENCOUNTER — Ambulatory Visit (INDEPENDENT_AMBULATORY_CARE_PROVIDER_SITE_OTHER): Payer: 59 | Admitting: Allergy

## 2021-09-05 ENCOUNTER — Encounter: Payer: Self-pay | Admitting: Allergy

## 2021-09-05 VITALS — BP 118/80 | HR 74 | Temp 97.8°F | Resp 16 | Ht 65.0 in | Wt 233.4 lb

## 2021-09-05 DIAGNOSIS — J4520 Mild intermittent asthma, uncomplicated: Secondary | ICD-10-CM

## 2021-09-05 DIAGNOSIS — J3089 Other allergic rhinitis: Secondary | ICD-10-CM | POA: Diagnosis not present

## 2021-09-05 DIAGNOSIS — K9049 Malabsorption due to intolerance, not elsewhere classified: Secondary | ICD-10-CM

## 2021-09-05 DIAGNOSIS — H1013 Acute atopic conjunctivitis, bilateral: Secondary | ICD-10-CM | POA: Diagnosis not present

## 2021-09-05 MED ORDER — MONTELUKAST SODIUM 10 MG PO TABS: 10.0000 mg | ORAL_TABLET | Freq: Every day | ORAL | 5 refills | Status: DC

## 2021-09-05 NOTE — Patient Instructions (Addendum)
-  Continue avoidance measures for grass pollen, weed pollen, tree pollen, molds, dust mite cat and dog.  -Continue Zyrtec 10 mg daily.  Can take additional dose if necessary.  Other long-acting antihistamines including Xyzal and Allegra that may be equally or more effective.  These are all over-the-counter options -Resume Singulair 10 mg nightly.   -Use dymista 1 spray each nostril twice a day.  This is a combination nasal spray with Flonase + Astelin (nasal antihistamine).  This helps with both nasal congestion and drainage.  If this is not covered by insurance then will prescribe the 2 sprays separately -For itchy watery eyes use olopatadine 0.2% 1 drop each eye daily as needed -Consider allergen immunotherapy if medication management is not effective enough in controlling symptoms    -Have access to albuterol inhaler 2 puffs every 4-6 hours as needed for cough/wheeze/shortness of breath/chest tightness.  May use 15-20 minutes prior to activity.   Monitor frequency of use.    Control goals:  Full participation in all desired activities (may need albuterol before activity) Albuterol use two time or less a week on average (not counting use with activity) Cough interfering with sleep two time or less a month Oral steroids no more than once a year No hospitalizations  - We have discussed the following in regards to foods:   Allergy: food allergy is when you have eaten a food, developed an allergic reaction after eating the food and have IgE to the food (positive food testing either by skin testing or blood testing).  Food allergy could lead to life threatening symptoms  Sensitivity: occurs when you have IgE to a food (positive food testing either by skin testing or blood testing) but is a food you eat without any issues.  This is not an allergy and we recommend keeping the food in the diet  Intolerance: this is when you have negative testing by either skin testing or blood testing thus not allergic  but the food causes symptoms (like belly pain, bloating, diarrhea etc) with ingestion.  These foods should be avoided to prevent symptoms.    -You have lactose intolerance and should continue lactose-free products in diet -Food testing to green vegetables were negative -You do not have food allergy to any foods at this time  Follow-up in 6 months or sooner if needed

## 2021-09-05 NOTE — Progress Notes (Signed)
Follow-up Note  RE: MINAL STULLER MRN: [id number] DOB: 1970/08/28 Date of Office Visit: 09/05/2021   History of present illness: Kristy [name] is a 51 y.o. female presenting today for follow-up of allergic rhinitis with conjunctivitis, reactive airway and food intolerance.  She was last seen in the office on 02/21/2021 by myself.  She states symptoms her allergy symptoms have been controlled with use of zyrtec nightly.   She states she will take the singulair as needed and this is when her symptoms are particularly bad.   She states she did get the Dymista nasal spray however she does not use this on a regular basis either.  She is also not reporting significant itchy or watery eyes at this time. She states has used albuterol several times since last visit once related to dog exposure and another she states with sleeping with fan on and developed wheezing.  Albuterol does relieve symptoms when used.  She otherwise has not required any systemic steroids, ED or urgent care visits for breathing issues. In regards to food avoidance when she has lactose intolerance.  Review of systems: Review of Systems  All other systems negative unless noted above in HPI  Past medical/social/surgical/family history have been reviewed and are unchanged unless specifically indicated below.  No changes  Medication List: Current Outpatient Medications  Medication Sig Dispense Refill   albuterol (VENTOLIN HFA) 108 (90 Base) MCG/ACT inhaler Inhale 2 puffs into the lungs every 6 (six) hours as needed for wheezing or shortness of breath. 18 g 1   aspirin EC 81 MG tablet Take 1 tablet (81 mg total) by mouth daily. Swallow whole. 90 tablet 3   cetirizine (ZYRTEC) 10 MG tablet Take 1 tablet (10 mg total) by mouth 2 (two) times daily. 60 tablet 3   famotidine (PEPCID) 20 MG tablet One after supper 30 tablet 11   montelukast (SINGULAIR) 10 MG tablet Take 1 tablet (10 mg total) by mouth at  bedtime. 30 tablet 5   omeprazole (PRILOSEC) 40 MG capsule TAKE 1 CAPSULE BY MOUTH EVERY DAY 90 capsule 1   rosuvastatin (CRESTOR) 20 MG tablet Take 1 tablet (20 mg total) by mouth daily. 90 tablet 3   Fluticasone-Salmeterol,sensor, (AIRDUO DIGIHALER) 232-14 MCG/ACT AEPB Inhale 1 puff into the lungs 2 times daily at 12 noon and 4 pm. (Patient not taking: Reported on 09/05/2021) 1 each 0   No current facility-administered medications for this visit.     Known medication allergies: Allergies  Allergen Reactions   Iodinated Contrast Media Itching     Physical examination: Blood pressure 118/80, pulse 74, temperature 97.8 F (36.6 C), resp. rate 16, height 5\' 5"  (1.651 m), weight 233 lb 6 oz (105.9 kg), SpO2 99 %.  General: Alert, interactive, in no acute distress. HEENT: PERRLA, TMs pearly gray, turbinates minimally edematous without discharge, post-pharynx non erythematous. Neck: Supple without lymphadenopathy. Lungs: Clear to auscultation without wheezing, rhonchi or rales. {no increased work of breathing. CV: Normal S1, S2 without murmurs. Abdomen: Nondistended, nontender. Skin: Warm and dry, without lesions or rashes. Extremities:  No clubbing, cyanosis or edema. Neuro:   Grossly intact.  Diagnositics/Labs: None today  Assessment and plan: Allergic rhinitis with conjunctivitis  -Continue avoidance measures for grass pollen, weed pollen, tree pollen, molds, dust mite cat and dog.  -Continue Zyrtec 10 mg daily.  Can take additional dose if necessary.  Other long-acting antihistamines including Xyzal and Allegra that may be equally or more effective.  These are all  over-the-counter options -Resume Singulair 10 mg nightly.   -Use dymista 1 spray each nostril twice a day.  This is a combination nasal spray with Flonase + Astelin (nasal antihistamine).  This helps with both nasal congestion and drainage.  If this is not covered by insurance then will prescribe the 2 sprays  separately -For itchy watery eyes use olopatadine 0.2% 1 drop each eye daily as needed -Consider allergen immunotherapy if medication management is not effective enough in controlling symptoms    Reactive airway, allergen driven -Have access to albuterol inhaler 2 puffs every 4-6 hours as needed for cough/wheeze/shortness of breath/chest tightness.  May use 15-20 minutes prior to activity.   Monitor frequency of use.    Control goals:  Full participation in all desired activities (may need albuterol before activity) Albuterol use two time or less a week on average (not counting use with activity) Cough interfering with sleep two time or less a month Oral steroids no more than once a year No hospitalizations  Food intolerance -The following has been discussed:   Allergy: food allergy is when you have eaten a food, developed an allergic reaction after eating the food and have IgE to the food (positive food testing either by skin testing or blood testing).  Food allergy could lead to life threatening symptoms  Sensitivity: occurs when you have IgE to a food (positive food testing either by skin testing or blood testing) but is a food you eat without any issues.  This is not an allergy and we recommend keeping the food in the diet  Intolerance: this is when you have negative testing by either skin testing or blood testing thus not allergic but the food causes symptoms (like belly pain, bloating, diarrhea etc) with ingestion.  These foods should be avoided to prevent symptoms.    -You have lactose intolerance and should continue lactose-free products in diet -Food testing to green vegetables were negative -You do not have food allergy to any foods at this time  Follow-up in 6 months or sooner if needed    I appreciate the opportunity to take part in Myeesha's care. Please do not hesitate to contact me with questions.  Sincerely,   Prudy Feeler, MD Allergy/Immunology [facility]

## 2021-09-20 ENCOUNTER — Other Ambulatory Visit: Payer: Self-pay | Admitting: Family Medicine

## 2021-09-20 DIAGNOSIS — R058 Other specified cough: Secondary | ICD-10-CM

## 2021-09-24 NOTE — Progress Notes (Unsigned)
Patient ID: [name]                 DOB: [birth year]                    MRN: [id number]     HPI: Kristy [name] is a 51 y.o. female patient referred to lipid clinic by Dr. Marlou Porch. PMH is significant for HTN, HLD, CAD (RCA calcifications seen on CT scan 07/2020; cardiac CT 05/2021 found calcium score of 322 which was 99th percentile for age and sex-matched control and revealed calcified plaque in RCA and noncalcified plaque in LAD, FFR showed obstructive lesion in distal LAD but no functionally significant stenosis), chest pain, obesity, family history of premature CAD. Rosuvastatin was increased from 10 to 20 mg in November 2022 following results of coronary CT without significant improvement in LDL.   *** -max out rosuva/add zetia? See if insurance will cover [id number]. Has Pharmacist, community  Current Medications: rosuvastatin 20 mg Intolerances: none Risk Factors: CAD, HLD, family history of premature ASCVD LDL goal: <70 mg/dL  Diet:   Exercise:   Family History: Brother had MI at age 26, MI in mother (age 25) and father (age 28), HTN in brother, brother, sister.   Social History: Never smoker  Labs: 08/22/21: TC 170, TG 49, HDL 63, LDL 97 (rosuvastatin 20 mg) 06/12/21: TC 168, TG 64, HDL 61, LDL 95 (rosuvastatin 20 mg for only ~3 weeks) 05/02/21: TC 178, TG 68, HDL 58, LDL 107 (rosuvastatin 10 mg)  Past Medical History:  Diagnosis Date   Allergy    BRCA negative 11/2012, 4/19   BRCA neg 2014; MyRisk update neg except POLE VUS 4/19   Dysrhythmia    a. 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 pylori infection 10/2010   H/O exercise stress test 2018   Increased risk of breast cancer 11/2012; 4/19   5/14 IBIS=25.6%; 4/19 IBIS=25.8%   Iron deficiency anemia, unspecified    history of, normal hemoglobin 06/2019   Obesity    Seasonal  allergies    ragweed, tree pollen, mild and dust per testing 08/2012   Unspecified vitamin D deficiency    Vitamin D deficiency 2017    Current Outpatient Medications on File Prior to Visit  Medication Sig Dispense Refill   albuterol (VENTOLIN HFA) 108 (90 Base) MCG/ACT inhaler Inhale 2 puffs into the lungs every 6 (six) hours as needed for wheezing or shortness of breath. 18 g 1   aspirin EC 81 MG tablet Take 1 tablet (81 mg total) by mouth daily. Swallow whole. 90 tablet 3   cetirizine (ZYRTEC) 10 MG tablet Take 1 tablet (10 mg total) by mouth 2 (two) times daily. 60 tablet 3   famotidine (PEPCID) 20 MG tablet TAKE ONE TABLET BY MOUTH AFTER SUPPER 90 tablet 0   Fluticasone-Salmeterol,sensor, (AIRDUO DIGIHALER) 232-14 MCG/ACT AEPB Inhale 1 puff into the lungs 2 times daily at 12 noon and 4 pm. (Patient not taking: Reported on 09/05/2021) 1 each 0   montelukast (SINGULAIR) 10 MG tablet Take 1 tablet (10 mg total) by mouth at bedtime. 30 tablet 5   omeprazole (PRILOSEC) 40 MG capsule TAKE 1 CAPSULE BY MOUTH EVERY DAY 90 capsule 1   rosuvastatin (CRESTOR) 20 MG tablet Take 1 tablet (20 mg total) by mouth daily. 90 tablet 3   No  current facility-administered medications on file prior to visit.    Allergies  Allergen Reactions   Iodinated Contrast Media Itching    Assessment/Plan:  1. Hyperlipidemia -

## 2021-09-25 ENCOUNTER — Ambulatory Visit: Payer: 59 | Admitting: Student-PharmD

## 2021-09-25 ENCOUNTER — Other Ambulatory Visit: Payer: Self-pay

## 2021-09-25 VITALS — Wt 225.6 lb

## 2021-09-25 DIAGNOSIS — E782 Mixed hyperlipidemia: Secondary | ICD-10-CM | POA: Diagnosis not present

## 2021-09-25 DIAGNOSIS — E785 Hyperlipidemia, unspecified: Secondary | ICD-10-CM | POA: Insufficient documentation

## 2021-09-25 NOTE — Patient Instructions (Addendum)
Nice to see you today! ? ?Keep up the good work with diet and exercise. Aim for a diet full of vegetables, fruit and lean meats (chicken, [place], fish). Try to limit carbs (bread, pasta, sugar, rice) and red meat consumption. ? ?Your goal LDL is less than 70 mg/dL, you're currently at 97 mg/dL ? ?Medication Changes: ?Continue current medications. When your labs come back, we may need to increase rosuvastatin to 40 mg and start ezetimibe 10 mg daily. We will discuss this tomorrow. If we make a change, we will schedule a fasting lab appt for 6 weeks later.  ? ?Please give [place] a call at [phone] with any questions or concerns. ?

## 2021-09-26 ENCOUNTER — Telehealth: Payer: Self-pay | Admitting: *Deleted

## 2021-09-26 DIAGNOSIS — E782 Mixed hyperlipidemia: Secondary | ICD-10-CM

## 2021-09-26 DIAGNOSIS — Z79899 Other long term (current) drug therapy: Secondary | ICD-10-CM

## 2021-09-26 LAB — LIPID PANEL
Chol/HDL Ratio: 2.9 ratio (ref 0.0–4.4)
Cholesterol, Total: 153 mg/dL (ref 100–199)
HDL: 53 mg/dL (ref >39)
LDL Chol Calc (NIH): 89 mg/dL (ref 0–99)
Triglycerides: 54 mg/dL (ref 0–149)
VLDL Cholesterol Cal: 11 mg/dL (ref 5–40)

## 2021-09-26 LAB — LDL CHOLESTEROL, DIRECT: LDL Direct: 81 mg/dL (ref 0–99)

## 2021-09-26 MED ORDER — EZETIMIBE 10 MG PO TABS: 10.0000 mg | ORAL_TABLET | Freq: Every day | ORAL | 3 refills | Status: DC

## 2021-09-26 NOTE — Telephone Encounter (Signed)
Reviewed results of lipid panel with pt.  Advised of Dr Marlou Porch orders.  Zetia to be sent into [facility] [address]  Pt will repeat lab 01/01/22.  All questions, if any were answered at the time of the call. ?

## 2021-10-29 NOTE — Progress Notes (Signed)
? ?Acute Office Visit ? ?Subjective:  ? ? Patient ID: Kristy [name], female    DOB: September 14, 1970, 51 y.o.   MRN: [id number] ? ?Chief Complaint  ?Patient presents with  ? Acute Visit  ?  Joint pain in feet and hands. She also has been having constipation issues.  ? ? ?HPI ?Patient is in today for pain in left hand and left foot x 2 months; denies injury or fall; OTC Aleve helps; mild swelling in hand and foot; is right hand dominant and works as a Emergency planning/management officer; Also reports constipation  for 2 months after starting ozempic; OTC laxative gummies, prebiotics, probiotics were somewhat helpful; drinks 4 - 5 bottles of water a day; eats 4 - 5 servings of fruits and vegetables. ? ?Outpatient Medications Prior to Visit  ?Medication Sig Dispense Refill  ? albuterol (VENTOLIN HFA) 108 (90 Base) MCG/ACT inhaler Inhale 2 puffs into the lungs every 6 (six) hours as needed for wheezing or shortness of breath. 18 g 1  ? aspirin EC 81 MG tablet Take 1 tablet (81 mg total) by mouth daily. Swallow whole. 90 tablet 3  ? cetirizine (ZYRTEC) 10 MG tablet Take 1 tablet (10 mg total) by mouth 2 (two) times daily. 60 tablet 3  ? ezetimibe (ZETIA) 10 MG tablet Take 1 tablet (10 mg total) by mouth daily. 90 tablet 3  ? famotidine (PEPCID) 20 MG tablet TAKE ONE TABLET BY MOUTH AFTER SUPPER 90 tablet 0  ? montelukast (SINGULAIR) 10 MG tablet Take 1 tablet (10 mg total) by mouth at bedtime. 30 tablet 5  ? omeprazole (PRILOSEC) 40 MG capsule TAKE 1 CAPSULE BY MOUTH EVERY DAY 90 capsule 1  ? Fluticasone-Salmeterol,sensor, (AIRDUO DIGIHALER) 232-14 MCG/ACT AEPB Inhale 1 puff into the lungs 2 times daily at 12 noon and 4 pm. (Patient not taking: Reported on 09/05/2021) 1 each 0  ? rosuvastatin (CRESTOR) 20 MG tablet Take 1 tablet (20 mg total) by mouth daily. 90 tablet 3  ? ?No facility-administered medications prior to visit.  ? ? ?Allergies  ?Allergen Reactions  ? Iodinated Contrast Media Itching  ? ? ?Review of Systems  ?Constitutional:   Negative for activity change and chills.  ?HENT:  Negative for congestion and voice change.   ?Eyes:  Negative for pain and redness.  ?Respiratory:  Negative for cough and wheezing.   ?Cardiovascular:  Negative for chest pain.  ?Gastrointestinal:  Negative for constipation, diarrhea, nausea and vomiting.  ?Endocrine: Negative for polyuria.  ?Genitourinary:  Negative for frequency.  ?Musculoskeletal:  Positive for arthralgias, joint swelling and myalgias.  ?Skin:  Negative for color change and rash.  ?Allergic/Immunologic: Negative for immunocompromised state.  ?Neurological:  Negative for dizziness.  ?Psychiatric/Behavioral:  Negative for agitation.   ? ?   ?Objective:  ?  ?Physical Exam ?Vitals and nursing note reviewed.  ?Constitutional:   ?   General: She is not in acute distress. ?   Appearance: Normal appearance. She is not ill-appearing.  ?HENT:  ?   Head: Normocephalic and atraumatic.  ?   Right Ear: External ear normal.  ?   Left Ear: External ear normal.  ?   Nose: No congestion.  ?Eyes:  ?   Extraocular Movements: Extraocular movements intact.  ?   Conjunctiva/sclera: Conjunctivae normal.  ?   Pupils: Pupils are equal, round, and reactive to light.  ?Cardiovascular:  ?   Rate and Rhythm: Normal rate and regular rhythm.  ?   Pulses: Normal pulses.  ?  Heart sounds: Normal heart sounds.  ?Pulmonary:  ?   Effort: Pulmonary effort is normal.  ?   Breath sounds: Normal breath sounds. No wheezing.  ?Abdominal:  ?   General: Bowel sounds are normal.  ?   Palpations: Abdomen is soft.  ?Musculoskeletal:     ?   General: Normal range of motion.  ?   Right hand: Normal.  ?   Left hand: Bony tenderness present. No swelling. Normal range of motion. Normal pulse.  ?   Cervical back: Normal range of motion and neck supple.  ?   Right lower leg: Normal. No edema.  ?   Left lower leg: Tenderness present. No swelling. No edema.  ?Skin: ?   General: Skin is warm and dry.  ?   Findings: No bruising.  ?Neurological:  ?    General: No focal deficit present.  ?   Mental Status: She is alert and oriented to person, place, and time.  ?Psychiatric:     ?   Mood and Affect: Mood normal.     ?   Behavior: Behavior normal.     ?   Thought Content: Thought content normal.  ? ? ?BP 110/70   Pulse (!) 52   Ht '5\' 5"'$  (1.651 m)   Wt 227 lb (103 kg)   SpO2 97%   BMI 37.77 kg/m?  ? ?Wt Readings from Last 3 Encounters:  ?10/30/21 227 lb (103 kg)  ?09/25/21 225 lb 9.6 oz (102.3 kg)  ?09/05/21 233 lb 6 oz (105.9 kg)  ? ? ?Results for orders placed or performed in visit on 09/25/21  ?Lipid panel  ?Result Value Ref Range  ? Cholesterol, Total 153 100 - 199 mg/dL  ? Triglycerides 54 0 - 149 mg/dL  ? HDL 53 >39 mg/dL  ? VLDL Cholesterol Cal 11 5 - 40 mg/dL  ? LDL Chol Calc (NIH) 89 0 - 99 mg/dL  ? Chol/HDL Ratio 2.9 0.0 - 4.4 ratio  ?Direct LDL  ?Result Value Ref Range  ? LDL Direct 81 0 - 99 mg/dL  ? ? ?   ?Assessment & Plan:  ?1. Constipation, unspecified constipation type ?- For constipation drink 8 - 10 glasses of water a day, can try OTC senna laxative or Miralax (polyethylene glycol), can add any OTC probiotic and any OTC digestive enzyme. Also increase fiber with fruits (ex prunes), vegetables, or OTC Benefiber or Citrical. You can go to a store with a pharmacy and ask the staff to show you where to find these items. ? ?2. Left hand pain ?- DG Hand Complete Left; Future ?- Ambulatory referral to Orthopedic Surgery ? ?3. Left foot pain ?- DG Foot Complete Left; Future ?- Ambulatory referral to Orthopedic Surgery ?OTC pain medicine as needed; Tylenol or NSAIDS with food; Voltaren Gel or Aspercreme with lidocaine as needed ? ? ?No orders of the defined types were placed in this encounter. ? ? ?Return for Return as Already Scheduled. ? ?Irene Pap, PA-C ?

## 2021-10-30 ENCOUNTER — Ambulatory Visit
Admission: RE | Admit: 2021-10-30 | Discharge: 2021-10-30 | Disposition: A | Discharge status: Home / Self Care | Payer: 59 | Source: Ambulatory Visit | Attending: Physician Assistant | Admitting: Physician Assistant

## 2021-10-30 ENCOUNTER — Encounter: Payer: Self-pay | Admitting: Physician Assistant

## 2021-10-30 ENCOUNTER — Ambulatory Visit: Payer: 59 | Admitting: Physician Assistant

## 2021-10-30 VITALS — BP 110/70 | HR 52 | Ht 65.0 in | Wt 227.0 lb

## 2021-10-30 DIAGNOSIS — K5900 Constipation, unspecified: Secondary | ICD-10-CM | POA: Diagnosis not present

## 2021-10-30 DIAGNOSIS — M79642 Pain in left hand: Secondary | ICD-10-CM

## 2021-10-30 DIAGNOSIS — M79672 Pain in left foot: Secondary | ICD-10-CM

## 2021-10-30 NOTE — Patient Instructions (Addendum)
You will get a call to schedule an appointment with Orthopedics ? ? ?You can take OTC pain medicine as needed: ? ?Tylenol (generic is acetamenophen) ? ?Advil or Motrin (generic is ibuprofen) ALWAYS TAKE WITH FOOD ?Aleve (generic is naprosyn sodium) ALWAYS TAKE WITH FOOD ? ?Aspercreme with lidocaine ?Muscle rubs like Biofreeze, IcyHot, Bengay ? ?Voltaren gel (generic is Diclofenac sodium) ?___________________________________ ? ?You can walk in for x-ray at --- ? ?[facility] ? ?[facility] ?[address] ?[address] ?[place], [place] [address] ? ?Phone [phone] ?Fax [phone] ? ?Hours of Operation ?General hours of operation are Monday - Friday, 8 am-5 pm  ? ?For constipation drink 8 - 10 glasses of water a day, can try OTC senna laxative or Miralax (polyethylene glycol), can add any OTC probiotic and any OTC digestive enzyme. Also increase fiber with fruits (ex prunes), vegetables, or OTC Benefiber or Citrical. You can go to a store with a pharmacy and ask the staff to show you where to find these items. ? ?

## 2021-10-31 ENCOUNTER — Encounter: Payer: Self-pay | Admitting: Physician Assistant

## 2021-11-07 ENCOUNTER — Ambulatory Visit: Payer: 59 | Admitting: Orthopaedic Surgery

## 2021-11-07 ENCOUNTER — Encounter: Payer: Self-pay | Admitting: Orthopaedic Surgery

## 2021-11-07 ENCOUNTER — Ambulatory Visit: Payer: Self-pay

## 2021-11-07 DIAGNOSIS — M79672 Pain in left foot: Secondary | ICD-10-CM | POA: Diagnosis not present

## 2021-11-07 DIAGNOSIS — M79642 Pain in left hand: Secondary | ICD-10-CM | POA: Diagnosis not present

## 2021-11-07 MED ORDER — PREDNISONE 50 MG PO TABS: ORAL_TABLET | ORAL | 0 refills | Status: DC

## 2021-11-07 MED ORDER — MELOXICAM 15 MG PO TABS
15.0000 mg | ORAL_TABLET | Freq: Every day | ORAL | 0 refills | Status: DC
Start: 2021-11-07 — End: 2021-11-27

## 2021-11-07 NOTE — Progress Notes (Signed)
The patient is a very pleasant and active 51 year old female who comes in with 2 complaints.  She has been having left hand pain in her middle finger over the MCP joint for just a few weeks now as well as left foot pain between her second and third rays with swelling for about a month now.  She has had no known injury to either area.  She is not a diabetic.  She has no significant active medical issues at all.  She is right-hand dominant.  She has tried activity modification and rest.  She is taken over-the-counter anti-inflammatories and she still experiencing issues with both her left hand and her left foot.  She denies any numbness and tingling in her hand or her foot.  Again there has been no noted injury. ? ?Examination of her left hand does show some slight swelling at the middle finger MCP joint.  The flexion extension is normal and it is almost that I can palpate a small cyst around the extensor tendon.  There is no subluxation of the tendon.  She is neurovascularly intact.  She is slightly prominent on her right side as well but not the same in terms of the pain. ? ?3 views of the left hand are negative especially this area.  These were on the canopy system. ? ?Examination of her left foot does shows forefoot swelling around the second and third rays.  The skin is intact.  She is able to move her toes and her foot is well-perfused but is definitely tender around the second and third MTP joints. ? ?Several x-rays views of the left foot are also reviewed and show no obvious fracture or malalignment. ? ?I would like to start her on 50 mg of prednisone for 5 days as well as meloxicam once daily.  I will have her try Voltaren gel on both the left hand and left foot areas.  I would like to reevaluate her with another clinical exam in 2 weeks.  All question concerns were answered and addressed. ?

## 2021-11-22 ENCOUNTER — Other Ambulatory Visit: Payer: Self-pay | Admitting: Medical

## 2021-11-22 ENCOUNTER — Telehealth: Payer: Self-pay | Admitting: Family Medicine

## 2021-11-22 MED ORDER — PREDNISONE 10 MG PO TABS: ORAL_TABLET | ORAL | 0 refills | Status: DC

## 2021-11-22 NOTE — Telephone Encounter (Signed)
Pt called and states her orthopaedist gave her 5 prednisone pills 50 mg each.   He did not taper and she took them all and is feeling anxious, chest tight, restless.  I advised her to call the ortho back and she states she did no like the way he handled everything and does not want to call him. ? ?Please advise her what to do. [phone]  ?

## 2021-11-22 NOTE — Telephone Encounter (Signed)
Pt was notified and advised if her symptoms still are there after med taper dose then she needs to be evaluated  ?

## 2021-11-22 NOTE — Telephone Encounter (Signed)
Please advise 

## 2021-11-27 ENCOUNTER — Ambulatory Visit: Payer: 59 | Admitting: Medical

## 2021-11-27 VITALS — BP 110/70 | HR 74 | Ht 65.0 in | Wt 222.6 lb

## 2021-11-27 DIAGNOSIS — E559 Vitamin D deficiency, unspecified: Secondary | ICD-10-CM | POA: Diagnosis not present

## 2021-11-27 DIAGNOSIS — E782 Mixed hyperlipidemia: Secondary | ICD-10-CM

## 2021-11-27 DIAGNOSIS — Z0000 Encounter for general adult medical examination without abnormal findings: Secondary | ICD-10-CM | POA: Insufficient documentation

## 2021-11-27 DIAGNOSIS — I2510 Atherosclerotic heart disease of native coronary artery without angina pectoris: Secondary | ICD-10-CM | POA: Diagnosis not present

## 2021-11-27 DIAGNOSIS — R7301 Impaired fasting glucose: Secondary | ICD-10-CM

## 2021-11-27 DIAGNOSIS — Z23 Encounter for immunization: Secondary | ICD-10-CM | POA: Diagnosis not present

## 2021-11-27 DIAGNOSIS — J4540 Moderate persistent asthma, uncomplicated: Secondary | ICD-10-CM

## 2021-11-27 NOTE — Progress Notes (Signed)
Subjective:  ? ?HPI ? Kristy [name] is a 51 y.o. female who presents for ?Chief Complaint  ?Patient presents with  ? fasting cpe  ?  Fasting cpe, would like vitamin d checked today  ? ? ?Patient Care Team: ?Leovardo Thoman, Leward Quan as PCP - General (Family Medicine) ?Copland, Ginette Otto as Referring Physician (Obstetrics and Gynecology) ?Ladene Artist, MD as Consulting Physician (Gastroenterology) ?Mcarthur Rossetti, MD as Consulting Physician (Orthopedic Surgery) ?Donato Heinz, MD as Consulting Physician (Cardiology) ?Kennith Gain, MD as Consulting Physician (Allergy) ?Sees dentist ?Sees eye doctor ? ?Concerns: ?Here for routine physical. ? ?She is enrolled in a weight loss study through [employer].   She had some labs done in February.  She has had some recent weight loss thankfully.  She is on a GLP-1 medication. ? ?She called in recently after not feeling well on a prednisone 50 mg daily dosing for 5 days.  She feels better since last week.  She is completely off the prednisone at this time. ? ?Exercise includes some walking, some biking, some weights ? ? ?Reviewed their medical, surgical, family, social, medication, and allergy history and updated chart as appropriate. ? ? ?Past Medical History:  ?Diagnosis Date  ? Allergy   ? BRCA negative 11/2012, 4/19  ? BRCA neg 2014; MyRisk update neg except POLE VUS 4/19  ? Dysrhythmia   ? a. 2014: Monitor showing bigeminal PVC's  ? Family history of breast cancer   ? Family history of premature CAD   ? GERD (gastroesophageal reflux disease)   ? H. pylori infection 10/2010  ? H/O exercise stress test 2018  ? Increased risk of breast cancer 11/2012; 4/19  ? 5/14 IBIS=25.6%; 4/19 IBIS=25.8%  ? Iron deficiency anemia, unspecified   ? history of, normal hemoglobin 06/2019  ? Obesity   ? Seasonal allergies   ? ragweed, tree pollen, mild and dust per testing 08/2012  ? Unspecified vitamin D deficiency   ? Vitamin D deficiency 2017   ? ? ?Family History  ?Problem Relation Age of Onset  ? Heart disease Mother 26  ?     stent, CAD  ? Breast cancer Mother 30  ?     with recurrence about 31  ? Cancer Father   ?     esophageal cancer  ? Heart disease Father 46  ?     died of MI  ? Alcohol abuse Father   ? Esophageal cancer Father   ? Hypertension Sister   ? Hypertension Brother   ? CAD Brother   ?     MI at age 10  ? Hypertension Brother   ? Breast cancer Maternal Grandmother 59  ? Diabetes Neg Hx   ? Stroke Neg Hx   ? Colon cancer Neg Hx   ? Colon polyps Neg Hx   ? Stomach cancer Neg Hx   ? Rectal cancer Neg Hx   ? ? ? ?Current Outpatient Medications:  ?  albuterol (VENTOLIN HFA) 108 (90 Base) MCG/ACT inhaler, Inhale 2 puffs into the lungs every 6 (six) hours as needed for wheezing or shortness of breath., Disp: 18 g, Rfl: 1 ?  aspirin EC 81 MG tablet, Take 1 tablet (81 mg total) by mouth daily. Swallow whole., Disp: 90 tablet, Rfl: 3 ?  cetirizine (ZYRTEC) 10 MG tablet, Take 1 tablet (10 mg total) by mouth 2 (two) times daily., Disp: 60 tablet, Rfl: 3 ?  ezetimibe (ZETIA) 10 MG tablet, Take  1 tablet (10 mg total) by mouth daily., Disp: 90 tablet, Rfl: 3 ?  famotidine (PEPCID) 20 MG tablet, TAKE ONE TABLET BY MOUTH AFTER SUPPER, Disp: 90 tablet, Rfl: 0 ?  Fluticasone-Salmeterol,sensor, (AIRDUO DIGIHALER) 232-14 MCG/ACT AEPB, Inhale 1 puff into the lungs 2 times daily at 12 noon and 4 pm., Disp: 1 each, Rfl: 0 ?  montelukast (SINGULAIR) 10 MG tablet, Take 1 tablet (10 mg total) by mouth at bedtime., Disp: 30 tablet, Rfl: 5 ?  omeprazole (PRILOSEC) 40 MG capsule, TAKE 1 CAPSULE BY MOUTH EVERY DAY, Disp: 90 capsule, Rfl: 1 ?  rosuvastatin (CRESTOR) 20 MG tablet, Take 1 tablet (20 mg total) by mouth daily., Disp: 90 tablet, Rfl: 3 ? ?Allergies  ?Allergen Reactions  ? Iodinated Contrast Media Itching  ? ? ?Review of Systems ?Constitutional: -fever, -chills, -sweats, -unexpected weight change, -decreased appetite, -fatigue ?Allergy: -sneezing, -itching,  -congestion ?Dermatology: -changing moles, --rash, -lumps ?ENT: -runny nose, -ear pain, -sore throat, -hoarseness, -sinus pain, -teeth pain, - ringing in ears, -hearing loss, -nosebleeds ?Cardiology: -chest pain, -palpitations, -swelling, -difficulty breathing when lying flat, -waking up short of breath ?Respiratory: -cough, -shortness of breath, -difficulty breathing with exercise or exertion, -wheezing, -coughing up blood ?Gastroenterology: -abdominal pain, -nausea, -vomiting, -diarrhea, -constipation, -blood in stool, -changes in bowel movement, -difficulty swallowing or eating ?Hematology: -bleeding, -bruising  ?Musculoskeletal: -joint aches, -muscle aches, -joint swelling, -back pain, -neck pain, -cramping, -changes in gait ?Ophthalmology: denies vision changes, eye redness, itching, discharge ?Urology: -burning with urination, -difficulty urinating, -blood in urine, -urinary frequency, -urgency, -incontinence ?Neurology: -headache, -weakness, -tingling, -numbness, -memory loss, -falls, -dizziness ?Psychology: -depressed mood, -agitation, -sleep problems ?Breast/gyn: -breast tendnerss, -discharge, -lumps, -vaginal discharge,- irregular periods, -heavy periods ? ? ? ?  11/27/2021  ? 11:28 AM 06/12/2021  ? 11:29 AM 10/24/2020  ?  2:02 PM 04/03/2018  ?  1:36 PM 02/06/2017  ?  9:01 AM  ?Depression screen PHQ 2/9  ?Decreased Interest 0 0 0 2 0  ?Down, Depressed, Hopeless 0 0 0 0 0  ?PHQ - 2 Score 0 0 0 2 0  ?Altered sleeping    2   ?Tired, decreased energy    3   ?Change in appetite    3   ?Feeling bad or failure about yourself     0   ?Trouble concentrating    0   ?Moving slowly or fidgety/restless    0   ?Suicidal thoughts    0   ?PHQ-9 Score    10   ? ? ?   ?Objective:  ?BP 110/70   Pulse 74   Ht _0  (1.651 m)   Wt 222 lb 9.6 oz (101 kg)   BMI 37.04 kg/m?  ? ?General appearance: alert, no distress, WD/WN, African American female ?Skin: unremarkable ?HEENT: normocephalic, conjunctiva/corneas normal, sclerae  anicteric, PERRLA, EOMi, nares patent, no discharge or erythema, pharynx normal ?Oral cavity: MMM, tongue normal, teeth normal ?Neck: supple, no lymphadenopathy, no thyromegaly, no masses, normal ROM, no bruits ?Chest: non tender, normal shape and expansion ?Heart: RRR, normal S1, S2, no murmurs ?Lungs: CTA bilaterally, no wheezes, rhonchi, or rales ?Abdomen: +bs, soft, non tender, non distended, no masses, no hepatomegaly, no splenomegaly, no bruits ?Back: non tender, normal ROM, no scoliosis ?Musculoskeletal: upper extremities non tender, no obvious deformity, normal ROM throughout, lower extremities non tender, no obvious deformity, normal ROM throughout ?Extremities: no edema, no cyanosis, no clubbing ?Pulses: 2+ symmetric, upper and lower extremities, normal cap refill ?Neurological: alert, oriented x  3, CN2-12 intact, strength normal upper extremities and lower extremities, sensation normal throughout, DTRs 2+ throughout, no cerebellar signs, gait normal ?Psychiatric: normal affect, behavior normal, pleasant  ?Breast/gyn/rectal - deferred to gynecology  ? ? ? ?Assessment and Plan :  ? ?Encounter Diagnoses  ?Name Primary?  ? Encounter for health maintenance examination in adult Yes  ? Need for Td vaccine   ? Coronary artery disease involving native coronary artery of native heart without angina pectoris   ? Vitamin D deficiency   ? Moderate persistent reactive airway disease without complication   ? Mixed hyperlipidemia   ? Impaired fasting blood sugar   ? ? ? ?This visit was a preventative care visit, also known as wellness visit or routine physical.   Topics typically include healthy lifestyle, diet, exercise, preventative care, vaccinations, sick and well care, proper use of emergency dept and after hours care, as well as other concerns.   ? ? ?Recommendations: ?Continue to return yearly for your annual wellness and preventative care visits.  This gives [place] a chance to discuss healthy lifestyle, exercise,  vaccinations, review your chart record, and perform screenings where appropriate. ? ?I recommend you see your eye doctor yearly for routine vision care. ? ?I recommend you see your dentist yearly for routine den

## 2021-11-28 ENCOUNTER — Ambulatory Visit: Payer: 59 | Admitting: Orthopaedic Surgery

## 2021-11-28 LAB — URINALYSIS
Bilirubin, UA: NEGATIVE
Glucose, UA: NEGATIVE
Ketones, UA: NEGATIVE
Leukocytes,UA: NEGATIVE
Nitrite, UA: NEGATIVE
Protein,UA: NEGATIVE
Specific Gravity, UA: 1.019 (ref 1.005–1.030)
Urobilinogen, Ur: 0.2 mg/dL (ref 0.2–1.0)
pH, UA: 6 (ref 5.0–7.5)

## 2021-11-28 LAB — CBC
Hematocrit: 38 % (ref 34.0–46.6)
Hemoglobin: 12.6 g/dL (ref 11.1–15.9)
MCH: 29 pg (ref 26.6–33.0)
MCHC: 33.2 g/dL (ref 31.5–35.7)
MCV: 87 fL (ref 79–97)
Platelets: 227 10*3/uL (ref 150–450)
RBC: 4.35 x10E6/uL (ref 3.77–5.28)
RDW: 13.9 % (ref 11.7–15.4)
WBC: 4.9 10*3/uL (ref 3.4–10.8)

## 2021-11-28 LAB — COMPREHENSIVE METABOLIC PANEL
ALT: 102 IU/L — ABNORMAL HIGH (ref 0–32)
AST: 53 IU/L — ABNORMAL HIGH (ref 0–40)
Albumin/Globulin Ratio: 1.7 (ref 1.2–2.2)
Albumin: 3.9 g/dL (ref 3.8–4.8)
Alkaline Phosphatase: 62 IU/L (ref 44–121)
BUN/Creatinine Ratio: 9 (ref 9–23)
BUN: 7 mg/dL (ref 6–24)
Bilirubin Total: 0.3 mg/dL (ref 0.0–1.2)
CO2: 24 mmol/L (ref 20–29)
Calcium: 9.1 mg/dL (ref 8.7–10.2)
Chloride: 103 mmol/L (ref 96–106)
Creatinine, Ser: 0.79 mg/dL (ref 0.57–1.00)
Globulin, Total: 2.3 g/dL (ref 1.5–4.5)
Glucose: 77 mg/dL (ref 70–99)
Potassium: 4.3 mmol/L (ref 3.5–5.2)
Sodium: 139 mmol/L (ref 134–144)
Total Protein: 6.2 g/dL (ref 6.0–8.5)
eGFR: 92 mL/min/{1.73_m2} (ref >59)

## 2021-11-28 LAB — HEMOGLOBIN A1C
Est. average glucose Bld gHb Est-mCnc: 117 mg/dL
Hgb A1c MFr Bld: 5.7 % — ABNORMAL HIGH (ref 4.8–5.6)

## 2021-11-28 LAB — LIPID PANEL
Chol/HDL Ratio: 2.6 ratio (ref 0.0–4.4)
Cholesterol, Total: 134 mg/dL (ref 100–199)
HDL: 51 mg/dL (ref >39)
LDL Chol Calc (NIH): 70 mg/dL (ref 0–99)
Triglycerides: 60 mg/dL (ref 0–149)
VLDL Cholesterol Cal: 13 mg/dL (ref 5–40)

## 2021-11-28 LAB — VITAMIN D 25 HYDROXY (VIT D DEFICIENCY, FRACTURES): Vit D, 25-Hydroxy: 37.6 ng/mL (ref 30.0–100.0)

## 2021-11-28 LAB — IRON: Iron: 70 ug/dL (ref 27–159)

## 2021-11-29 ENCOUNTER — Telehealth: Payer: Self-pay | Admitting: Internal Medicine

## 2021-11-29 NOTE — Telephone Encounter (Signed)
Pt called and left vm that she read where her liver levels were elevated and she read about crestor and zetia and it can make it have higher levels or liver disease. She said her sister is going through the same thing about liver so shes not sure if its a family history thing or not. She wants to know if she needs to cut down on crestor to '10mg'$  given her LDL and wants to know about possible wegovy. Please advise

## 2021-11-30 LAB — HEPATITIS C ANTIBODY: Hep C Virus Ab: NONREACTIVE

## 2021-11-30 LAB — HEPATITIS B SURFACE ANTIGEN: Hepatitis B Surface Ag: NEGATIVE

## 2021-11-30 LAB — SPECIMEN STATUS REPORT

## 2021-12-04 ENCOUNTER — Other Ambulatory Visit: Payer: Self-pay | Admitting: Orthopaedic Surgery

## 2021-12-05 NOTE — Telephone Encounter (Signed)
Following up on this.

## 2021-12-06 NOTE — Telephone Encounter (Signed)
Pt was notified.  

## 2021-12-06 NOTE — Telephone Encounter (Signed)
I do not see future labs in for liver tests. Please put these in

## 2021-12-07 ENCOUNTER — Other Ambulatory Visit: Payer: Self-pay | Admitting: Medical

## 2021-12-07 ENCOUNTER — Telehealth: Payer: Self-pay | Admitting: Medical

## 2021-12-07 DIAGNOSIS — R7989 Other specified abnormal findings of blood chemistry: Secondary | ICD-10-CM

## 2021-12-07 NOTE — Telephone Encounter (Signed)
Pt was notified.  

## 2021-12-07 NOTE — Telephone Encounter (Signed)
Hey Gabriel Cirri, Nandana wants you to call her please.

## 2021-12-07 NOTE — Telephone Encounter (Signed)
Patient called wanted to know can Semaligude- weight loss medication that she is on from [employer] cause constipation. She is not regular and over the counter medication is not helping all the time either

## 2021-12-13 ENCOUNTER — Other Ambulatory Visit: Payer: Self-pay | Admitting: Medical

## 2021-12-13 DIAGNOSIS — R058 Other specified cough: Secondary | ICD-10-CM

## 2021-12-19 ENCOUNTER — Other Ambulatory Visit: Payer: 59

## 2021-12-19 DIAGNOSIS — R7989 Other specified abnormal findings of blood chemistry: Secondary | ICD-10-CM

## 2021-12-20 LAB — HEPATIC FUNCTION PANEL
ALT: 17 IU/L (ref 0–32)
AST: 15 IU/L (ref 0–40)
Albumin: 4 g/dL (ref 3.8–4.8)
Alkaline Phosphatase: 54 IU/L (ref 44–121)
Bilirubin Total: <0.2 mg/dL (ref 0.0–1.2)
Bilirubin, Direct: <0.1 mg/dL (ref 0.00–0.40)
Total Protein: 6.6 g/dL (ref 6.0–8.5)

## 2021-12-20 LAB — CK: Total CK: 96 U/L (ref 32–182)

## 2021-12-22 ENCOUNTER — Other Ambulatory Visit: Payer: Self-pay | Admitting: Medical

## 2022-01-01 ENCOUNTER — Other Ambulatory Visit: Payer: 59

## 2022-01-01 DIAGNOSIS — Z79899 Other long term (current) drug therapy: Secondary | ICD-10-CM

## 2022-01-01 DIAGNOSIS — E782 Mixed hyperlipidemia: Secondary | ICD-10-CM

## 2022-01-02 ENCOUNTER — Telehealth: Payer: Self-pay

## 2022-01-02 DIAGNOSIS — E782 Mixed hyperlipidemia: Secondary | ICD-10-CM

## 2022-01-02 LAB — LIPID PANEL
Chol/HDL Ratio: 5.1 ratio — ABNORMAL HIGH (ref 0.0–4.4)
Cholesterol, Total: 236 mg/dL — ABNORMAL HIGH (ref 100–199)
HDL: 46 mg/dL (ref >39)
LDL Chol Calc (NIH): 171 mg/dL — ABNORMAL HIGH (ref 0–99)
Triglycerides: 104 mg/dL (ref 0–149)
VLDL Cholesterol Cal: 19 mg/dL (ref 5–40)

## 2022-01-02 LAB — ALT: ALT: 17 IU/L (ref 0–32)

## 2022-01-02 NOTE — Telephone Encounter (Signed)
The patient has been notified of the result and verbalized understanding.  All questions (if any) were answered. Antonieta Iba, RN 01/02/2022 3:10 PM  Referral has been placed.

## 2022-01-02 NOTE — Telephone Encounter (Signed)
-----   Message from Jerline Pain, MD sent at 01/02/2022  2:58 PM EDT ----- LDL 171. 1 month ago LDL was 70 on both Zetia 10 and Crestor 20.  However, LFTs were elevated on this combination medication.  Since her LFTs were elevated previously, ALT 102 while on both Zetia and Crestor -now normal 17 off of these medications, I would like for her to sit down with our lipid clinic to discuss options for lipid management given her underlying coronary artery disease and strong family history.  Candee Furbish, MD

## 2022-01-09 ENCOUNTER — Encounter: Payer: Self-pay | Admitting: Physician Assistant

## 2022-01-09 ENCOUNTER — Ambulatory Visit: Payer: 59 | Admitting: Physician Assistant

## 2022-01-09 VITALS — BP 120/70 | Ht 65.0 in | Wt 214.2 lb

## 2022-01-09 DIAGNOSIS — N3001 Acute cystitis with hematuria: Secondary | ICD-10-CM

## 2022-01-09 DIAGNOSIS — R300 Dysuria: Secondary | ICD-10-CM

## 2022-01-09 LAB — POCT URINALYSIS DIP (CLINITEK)
Bilirubin, UA: NEGATIVE
Glucose, UA: NEGATIVE mg/dL
Ketones, POC UA: NEGATIVE mg/dL
Leukocytes, UA: NEGATIVE
Nitrite, UA: NEGATIVE
POC PROTEIN,UA: NEGATIVE
Spec Grav, UA: 1.015 (ref 1.010–1.025)
Urobilinogen, UA: 0.2 E.U./dL
pH, UA: 6 (ref 5.0–8.0)

## 2022-01-09 MED ORDER — SULFAMETHOXAZOLE-TRIMETHOPRIM 800-160 MG PO TABS: 1.0000 | ORAL_TABLET | Freq: Two times a day (BID) | ORAL | 0 refills | Status: AC

## 2022-01-09 NOTE — Progress Notes (Signed)
Acute Office Visit  Subjective:    Patient ID: Kristy [name], female    DOB: 01-Aug-1970, 51 y.o.   MRN: [id number]  Chief Complaint  Patient presents with   Acute Visit    Abdominal pain and urinary urgency     HPI Patient is in today for urinary discomfort and urgency x 4 days; denies fever/chills, denies nausea/vomiting, denies diarrhea/constipation; denies recent swimming; denies sitting in tub to take a bath; denies history of "kidney stone"; denies gross blood in urine.  Outpatient Medications Prior to Visit  Medication Sig Dispense Refill   albuterol (VENTOLIN HFA) 108 (90 Base) MCG/ACT inhaler Inhale 2 puffs into the lungs every 6 (six) hours as needed for wheezing or shortness of breath. 18 g 1   aspirin EC 81 MG tablet Take 1 tablet (81 mg total) by mouth daily. Swallow whole. 90 tablet 3   cetirizine (ZYRTEC) 10 MG tablet Take 1 tablet (10 mg total) by mouth 2 (two) times daily. 60 tablet 3   ezetimibe (ZETIA) 10 MG tablet Take 1 tablet (10 mg total) by mouth daily. 90 tablet 3   famotidine (PEPCID) 20 MG tablet TAKE ONE TABLET BY MOUTH AFTER SUPPER 90 tablet 0   Fluticasone-Salmeterol,sensor, (AIRDUO DIGIHALER) 232-14 MCG/ACT AEPB Inhale 1 puff into the lungs 2 times daily at 12 noon and 4 pm. 1 each 0   meloxicam (MOBIC) 15 MG tablet TAKE 1 TABLET (15 MG TOTAL) BY MOUTH DAILY. 30 tablet 0   montelukast (SINGULAIR) 10 MG tablet Take 1 tablet (10 mg total) by mouth at bedtime. 30 tablet 5   omeprazole (PRILOSEC) 40 MG capsule TAKE 1 CAPSULE BY MOUTH EVERY DAY 90 capsule 1   rosuvastatin (CRESTOR) 20 MG tablet Take 1 tablet (20 mg total) by mouth daily. 90 tablet 3   No facility-administered medications prior to visit.    Allergies  Allergen Reactions   Iodinated Contrast Media Itching    Review of Systems  Constitutional:  Negative for activity change and chills.  HENT:  Negative for congestion and voice change.   Eyes:  Negative for pain and redness.   Respiratory:  Negative for cough and wheezing.   Cardiovascular:  Negative for chest pain.  Gastrointestinal:  Negative for constipation, diarrhea, nausea and vomiting.  Endocrine: Negative for polyuria.  Genitourinary:  Positive for dysuria. Negative for frequency.  Skin:  Negative for color change and rash.  Allergic/Immunologic: Negative for immunocompromised state.  Neurological:  Negative for dizziness.  Psychiatric/Behavioral:  Negative for agitation.        Objective:    Physical Exam Vitals and nursing note reviewed.  Constitutional:      General: She is not in acute distress.    Appearance: Normal appearance. She is not ill-appearing.  HENT:     Head: Normocephalic and atraumatic.     Right Ear: External ear normal.     Left Ear: External ear normal.     Nose: No congestion.  Eyes:     Extraocular Movements: Extraocular movements intact.     Conjunctiva/sclera: Conjunctivae normal.     Pupils: Pupils are equal, round, and reactive to light.  Cardiovascular:     Rate and Rhythm: Normal rate and regular rhythm.     Pulses: Normal pulses.     Heart sounds: Normal heart sounds.  Pulmonary:     Effort: Pulmonary effort is normal.     Breath sounds: Normal breath sounds. No wheezing.  Abdominal:     General:  Bowel sounds are normal.     Palpations: Abdomen is soft.     Tenderness: There is no right CVA tenderness or left CVA tenderness.  Musculoskeletal:        General: Normal range of motion.     Cervical back: Normal range of motion and neck supple.     Right lower leg: No edema.     Left lower leg: No edema.  Skin:    General: Skin is warm and dry.     Findings: No bruising.  Neurological:     General: No focal deficit present.     Mental Status: She is alert and oriented to person, place, and time.  Psychiatric:        Mood and Affect: Mood normal.        Behavior: Behavior normal.        Thought Content: Thought content normal.     BP 120/70   Ht 5'  5" (1.651 m)   Wt 214 lb 3.2 oz (97.2 kg)   BMI 35.64 kg/m   Wt Readings from Last 3 Encounters:  01/09/22 214 lb 3.2 oz (97.2 kg)  11/27/21 222 lb 9.6 oz (101 kg)  10/30/21 227 lb (103 kg)    Results for orders placed or performed in visit on 01/09/22  POCT URINALYSIS DIP (CLINITEK)  Result Value Ref Range   Color, UA yellow yellow   Clarity, UA clear clear   Glucose, UA negative negative mg/dL   Bilirubin, UA negative negative   Ketones, POC UA negative negative mg/dL   Spec Grav, UA 1.015 1.010 - 1.025   Blood, UA moderate (A) negative   pH, UA 6.0 5.0 - 8.0   POC PROTEIN,UA negative negative, trace   Urobilinogen, UA 0.2 0.2 or 1.0 E.U./dL   Nitrite, UA Negative Negative   Leukocytes, UA Negative Negative       Assessment & Plan:  1. Dysuria - POCT URINALYSIS DIP (CLINITEK) - Urine Culture  2. Acute cystitis with hematuria - Urine Culture - Drink about 64 ounces of water a day, avoid or limit caffeine, take OTC cranberry supplements which support bladder health, eat a low sugar diet, blueberries are low in sugar and also support bladder health, after urinating always wipe front to back, urinate after sex, limit sitting in a tub to take baths - septra ds bid x 5 days, urine culture pending   Meds ordered this encounter  Medications   sulfamethoxazole-trimethoprim (BACTRIM DS) 800-160 MG tablet    Sig: Take 1 tablet by mouth 2 (two) times daily for 5 days.    Dispense:  10 tablet    Refill:  0    Order Specific Question:   Supervising Provider    Answer:   Denita Lung [[id number]]    Return in about 11 months (around 12/10/2022) for Return for Annual Exam with PCP Tysinger.  Irene Pap, PA-C

## 2022-01-09 NOTE — Patient Instructions (Signed)
Drink about 64 ounces of water a day, avoid or limit caffeine, take OTC cranberry supplements which support bladder health, eat a low sugar diet, blueberries are low in sugar and also support bladder health, after urinating always wipe front to back, urinate after sex, limit sitting in a tub to take baths  ?

## 2022-01-11 LAB — URINE CULTURE

## 2022-01-19 ENCOUNTER — Other Ambulatory Visit: Payer: Self-pay | Admitting: Medical

## 2022-01-19 DIAGNOSIS — R058 Other specified cough: Secondary | ICD-10-CM

## 2022-01-22 ENCOUNTER — Telehealth: Payer: Self-pay

## 2022-01-22 NOTE — Telephone Encounter (Signed)
[employer] VOICEMAIL: Patient requesting medicine. She went to ED last night for pain in her abdomen. They did an ultrasound and found her fibroids are back.[phone]

## 2022-01-22 NOTE — Telephone Encounter (Signed)
Spoke w/patient she was given an oxycodone while at ED which took care of the pain. They sent in a rx for Diclofenac which she has not picked up yet. This is a rx strength anti-inflammatory and will likely help better than ibuprofen. Advised patient to pick up rx from pharmacy and try it. If it isn't helping after several doses, she can contact us back for further advice. Attempted to transfer to front desk to schedule ED follow up appointment. No representatives available. Advised I will forward the message and someone will contact her for scheduling.

## 2022-01-30 ENCOUNTER — Ambulatory Visit: Payer: 59 | Admitting: Pharmacist

## 2022-01-30 DIAGNOSIS — E782 Mixed hyperlipidemia: Secondary | ICD-10-CM | POA: Diagnosis not present

## 2022-01-30 MED ORDER — ROSUVASTATIN CALCIUM 10 MG PO TABS: 10.0000 mg | ORAL_TABLET | Freq: Every day | ORAL | 3 refills | Status: DC

## 2022-01-30 NOTE — Progress Notes (Signed)
Patient ID: ANGELY DIETZ                 DOB: 1970/09/06                    MRN: [id number]     HPI: Reina Wilton Renegar is a 51 y.o. female patient referred to lipid clinic by Dr. Marlou Porch. PMH is significant for HTN, HLD, CAD (RCA calcifications seen on CT scan 07/2020; cardiac CT 05/2021 found calcium score of 322 which was 99th percentile for age and sex-matched control and revealed calcified plaque in RCA and noncalcified plaque in LAD, FFR showed obstructive lesion in distal LAD but no functionally significant stenosis), chest pain, obesity, family history of premature CAD. Rosuvastatin was increased from 10 to 20 mg in November 2022 following results of coronary CT without significant improvement in LDL.   Patient was seen in March in lipid clinic. She was started on ezetimibe 1m daily along with her rosuvastatin 257mdaily. Repeat labs showed LDL-C of 70 but ALT of 102. ALT normalized after stopping statin and zetia. Hepatitis labs negative.  Patient presents to lipid clinic today. She has not been on any cholesterol medications since May.  Reports she has made significant lifestyle modifications by eating healthier and exercising more. Has added in strength training as well recently.    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   Social History: Never smoker  Labs: 01/01/22 TC 236, TG  104, HDL 46, LDL-C 171 (no therapy) 11/27/21: TC 134, TG 60, HDL 51 LDL 70 (rosuvastatin 2044mnd zetia 59m62m/8/23: TC 170, TG 49, HDL 63, LDL 97 (rosuvastatin 20 mg) 06/12/21: TC 168, TG 64, HDL 61, LDL 95 (rosuvastatin 20 mg for only ~3 weeks) 05/02/21: TC 178, TG 68, HDL 58, LDL 107 (rosuvastatin 10 mg)  Past Medical History:  Diagnosis Date   Allergy    BRCA negative 11/2012, 4/19   BRCA neg 2014; MyRisk update neg except POLE VUS 4/19   Dysrhythmia    a. 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 pylori infection 10/2010   H/O exercise stress test 2018   Increased risk of breast cancer 11/2012; 4/19   5/14 IBIS=25.6%; 4/19 IBIS=25.8%   Iron deficiency anemia, unspecified    history of, normal hemoglobin 06/2019   Obesity    Seasonal allergies    ragweed, tree pollen, mild and dust per testing 08/2012   Unspecified vitamin D deficiency    Vitamin D deficiency 2017    Current Outpatient Medications on File Prior to Visit  Medication Sig Dispense Refill   albuterol (VENTOLIN HFA) 108 (90 Base) MCG/ACT inhaler Inhale 2 puffs into the lungs every 6 (six) hours as needed for wheezing or shortness  Patient ID: Teren S Caratachea                 DOB: 11/04/1970                    MRN: [id number]     HPI: Fryda S Perras is a 51 y.o. female patient referred to lipid clinic by Dr. Skains. PMH is significant for HTN, HLD, CAD (RCA calcifications seen on CT scan 07/2020; cardiac CT 05/2021 found calcium score of 322 which was 99th percentile for age and sex-matched control and revealed calcified plaque in RCA and noncalcified plaque in LAD, FFR showed obstructive lesion in distal LAD but no functionally significant stenosis), chest pain, obesity, family history of premature CAD. Rosuvastatin was increased from 10 to 20 mg in November 2022 following results of coronary CT without significant improvement in LDL.   Patient was seen in March in lipid clinic. She was started on ezetimibe 10mg daily along with her rosuvastatin 20mg daily. Repeat labs showed LDL-C of 70 but ALT of 102. ALT normalized after stopping statin and zetia. Hepatitis labs negative.  Patient presents to lipid clinic today. She has not been on any cholesterol medications since May.  Reports she has made significant lifestyle modifications by eating healthier and exercising more. Has added in strength training as well recently.    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   Social History: Never smoker  Labs: 01/01/22 TC 236, TG  104, HDL 46, LDL-C 171 (no therapy) 11/27/21: TC 134, TG 60, HDL 51 LDL 70 (rosuvastatin 20mg and zetia 10mg) 08/22/21: TC 170, TG 49, HDL 63, LDL 97 (rosuvastatin 20 mg) 06/12/21: TC 168, TG 64, HDL 61, LDL 95 (rosuvastatin 20 mg for only ~3 weeks) 05/02/21: TC 178, TG 68, HDL 58, LDL 107 (rosuvastatin 10 mg)  Past Medical History:  Diagnosis Date   Allergy    BRCA negative 11/2012, 4/19   BRCA neg 2014; MyRisk update neg except POLE VUS 4/19   Dysrhythmia    a. 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 pylori infection 10/2010   H/O exercise stress test 2018   Increased risk of breast cancer 11/2012; 4/19   5/14 IBIS=25.6%; 4/19 IBIS=25.8%   Iron deficiency anemia, unspecified    history of, normal hemoglobin 06/2019   Obesity    Seasonal allergies    ragweed, tree pollen, mild and dust per testing 08/2012   Unspecified vitamin D deficiency    Vitamin D deficiency 2017    Current Outpatient Medications on File Prior to Visit  Medication Sig Dispense Refill   albuterol (VENTOLIN HFA) 108 (90 Base) MCG/ACT inhaler Inhale 2 puffs into the lungs every 6 (six) hours as needed for wheezing or shortness

## 2022-01-30 NOTE — Progress Notes (Signed)
Patient ID: MALIN SAMBRANO                 DOB: 1970/12/15                    MRN: [id number]     HPI: Kristy [name] is a 51 y.o. female patient referred to lipid clinic by Dr. Marlou Porch. PMH is significant for HTN, HLD, CAD (RCA calcifications seen on CT scan 07/2020; cardiac CT 05/2021 found calcium score of 322 which was 99th percentile for age and sex-matched control and revealed calcified plaque in RCA and noncalcified plaque in LAD, FFR showed obstructive lesion in distal LAD but no functionally significant stenosis), chest pain, obesity, family history of premature CAD. Rosuvastatin was increased from 10 to 20 mg in November 2022 following results of coronary CT without significant improvement in LDL.   Patient was seen in March in lipid clinic. She was started on ezetimibe 57m daily along with her rosuvastatin 25mdaily. Repeat labs showed LDL-C of 70 but ALT of 102. ALT normalized after stopping statin and zetia. Hepatitis labs negative.  Resume statin and add PCSK9i  Patient arrives in good spirits. Reports she has made significant lifestyle modifications by eating healthier and exercising more since her labs were drawn last month and has lost about 10 lbs. She would like to recheck her cholesterol today to see what impact these changes have made before making medication adjustments.   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   Social History: Never smoker  Labs: 01/01/22 TC 236, TG 104, HDL 46, LDL-C 171 (no therapy) 11/27/21: TC 134, TG 60, HDL 51 LDL 70 (rosuvastatin 2034mnd zetia 51m57m/8/23: TC 170, TG 49, HDL 63, LDL 97 (rosuvastatin 20 mg) 06/12/21: TC 168, TG 64, HDL 61, LDL 95 (rosuvastatin 20 mg for only ~3 weeks) 05/02/21: TC 178, TG 68, HDL 58, LDL 107 (rosuvastatin 10 mg)  Past Medical History:  Diagnosis Date   Allergy    BRCA negative 11/2012, 4/19   BRCA neg 2014; MyRisk update neg except POLE VUS 4/19   Dysrhythmia    a. 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 pylori infection 10/2010   H/O exercise stress test 2018   Increased risk of breast cancer 11/2012; 4/19   5/14 IBIS=25.6%; 4/19 IBIS=25.8%   Iron deficiency anemia, unspecified    history of, normal hemoglobin 06/2019   Obesity    Seasonal allergies    ragweed, tree pollen, mild and dust per testing 08/2012   Unspecified vitamin D deficiency    Vitamin D deficiency 2017    Current Outpatient Medications on File Prior to Visit  Medication Sig Dispense Refill   albuterol (VENTOLIN HFA) 108 (90 Base) MCG/ACT inhaler Inhale  2 puffs into the lungs every 6 (six) hours as needed for wheezing or shortness of breath. 18 g 1   aspirin EC 81 MG tablet Take 1 tablet (81 mg total) by mouth daily. Swallow whole. 90 tablet 3   cetirizine (ZYRTEC) 10 MG tablet Take 1 tablet (10 mg total) by mouth 2 (two) times daily. 60 tablet 3   ezetimibe (ZETIA) 10 MG tablet Take 1 tablet (10 mg total) by mouth daily. 90 tablet 3   famotidine (PEPCID) 20 MG tablet TAKE ONE TABLET BY MOUTH AFTER SUPPER 90 tablet 0   Fluticasone-Salmeterol,sensor, (AIRDUO DIGIHALER) 232-14 MCG/ACT AEPB Inhale 1 puff into the lungs 2 times daily at 12 noon and 4 pm. 1 each 0   meloxicam (MOBIC) 15 MG tablet TAKE 1 TABLET (15 MG  TOTAL) BY MOUTH DAILY. 30 tablet 0   montelukast (SINGULAIR) 10 MG tablet Take 1 tablet (10 mg total) by mouth at bedtime. 30 tablet 5   omeprazole (PRILOSEC) 40 MG capsule TAKE 1 CAPSULE BY MOUTH EVERY DAY 90 capsule 1   No current facility-administered medications on file prior to visit.    Allergies  Allergen Reactions   Iodinated Contrast Media Itching    Assessment/Plan:  1. Hyperlipidemia - Last LDL is not at goal <70 mg/dL on rosuvastatin 20 mg daily. Since she has made significant lifestyle modifications since her last lab draw, will recheck lipid panel today. Pending results, will likely need to increase rosuvastatin to 40 mg and possibly add ezetimibe. Will call the patient tomorrow and discuss any medication changes and schedule for follow up labs at that time. She is agreeable to this plan. Encouraged continued healthy diet and exercise.   Rebbeca Paul, PharmD PGY2 [facility] Pharm[facility] Resident 01/30/2022 8:46 AM

## 2022-01-30 NOTE — Patient Instructions (Addendum)
Restart rosuvastatin '10mg'$  daily I will submit a prior authorization for Repatha Continue with your diet changes and your physical activity!

## 2022-01-31 ENCOUNTER — Telehealth: Payer: Self-pay | Admitting: Pharmacist

## 2022-01-31 ENCOUNTER — Other Ambulatory Visit: Payer: 59

## 2022-01-31 DIAGNOSIS — E782 Mixed hyperlipidemia: Secondary | ICD-10-CM

## 2022-01-31 LAB — LIPID PANEL
Chol/HDL Ratio: 4.8 ratio — ABNORMAL HIGH (ref 0.0–4.4)
Cholesterol, Total: 213 mg/dL — ABNORMAL HIGH (ref 100–199)
HDL: 44 mg/dL (ref >39)
LDL Chol Calc (NIH): 158 mg/dL — ABNORMAL HIGH (ref 0–99)
Triglycerides: 61 mg/dL (ref 0–149)
VLDL Cholesterol Cal: 11 mg/dL (ref 5–40)

## 2022-01-31 MED ORDER — REPATHA SURECLICK 140 MG/ML ~~LOC~~ SOAJ
1.0000 | SUBCUTANEOUS | 11 refills | Status: DC
Start: 2022-01-31 — End: 2022-04-26

## 2022-01-31 NOTE — Telephone Encounter (Signed)
Repatha approved through 01/31/23. Called pt and made her aware. Gave her instructions on how to get a copay card. Rx sent to pharmacy. Labs already scheduled for Sept

## 2022-02-19 ENCOUNTER — Ambulatory Visit: Payer: 59 | Admitting: Family Medicine

## 2022-02-19 VITALS — BP 130/86 | HR 75 | Temp 98.8°F | Wt 209.4 lb

## 2022-02-19 DIAGNOSIS — M1990 Unspecified osteoarthritis, unspecified site: Secondary | ICD-10-CM | POA: Diagnosis not present

## 2022-02-19 NOTE — Progress Notes (Signed)
   Subjective:    Patient ID: Kristy [name], female    DOB: [birth year], 51 y.o.   MRN: [id number]  HPI She is here for consult concerning swelling in her hands and feet.  Is been going on for at least the last month or so.  She had previous difficulty with this and was seen by orthopedics.  She was given a short course of steroids which really helped but she thinks she had some withdrawal symptoms from the steroid.  She also mention the fact that her sister was recently diagnosed with rheumatoid arthritis.  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   Review of Systems     Objective:   Physical Exam Exam of both hands does show some slight swelling of the third MCP joints bilaterally.  No other joints were involved.       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  Discussed the fact that if it starts to interfere with her activities of daily living we need to get more aggressive.  Also a month-long course of an anti-inflammatory with the use of Tylenol is okay but if she continues to need this then, rheumatologic referral will be made.  She was comfortable with that approach.

## 2022-02-19 NOTE — Patient Instructions (Signed)
Take 2 Aleve twice per day for the next month and you can also take 2 Tylenol 4 times per day if you need to.  If you keep having difficulty then I am going to refer you to rheumatology

## 2022-03-07 ENCOUNTER — Encounter: Payer: Self-pay | Admitting: Obstetrics and Gynecology

## 2022-03-07 ENCOUNTER — Ambulatory Visit: Payer: 59 | Admitting: Obstetrics and Gynecology

## 2022-03-07 VITALS — BP 110/80 | Ht 65.0 in | Wt 210.6 lb

## 2022-03-07 DIAGNOSIS — D219 Benign neoplasm of connective and other soft tissue, unspecified: Secondary | ICD-10-CM

## 2022-03-07 DIAGNOSIS — Z7689 Persons encountering health services in other specified circumstances: Secondary | ICD-10-CM | POA: Diagnosis not present

## 2022-03-07 NOTE — Progress Notes (Signed)
Patient presents today for an ED follow-up from [facility] regarding fibroids. She states no abnormal bleeding since ablation 9 years ago. Patient stated abdominal and pelvic pain are what led to her going to the ED, since has resolved. No other questions at this time.

## 2022-03-08 NOTE — Progress Notes (Signed)
HPI:      [name] is a 51 y.o. [phone] who LMP was No LMP recorded. Patient has had an ablation.  Subjective:   She presents today as emergency department follow-up.  She presented to the ED a few weeks ago with acute onset pelvic pain.  Imaging showed only fibroids which were known to her.  Since her emergency department visit her pain has resolved.  Of significant note, patient has previously had an endometrial ablation and has had no bleeding since that time. She is recently begun to have more significant hot flashes almost on a daily basis.  She presents today as follow-up and is wondering if there is anything to be done about her episode of pelvic pain or uterine fibroids.    Hx: The following portions of the patient's history were reviewed and updated as appropriate:             She  has a past medical history of Allergy, BRCA negative (11/2012, 4/19), Dysrhythmia, Family history of breast cancer, Family history of premature CAD, GERD (gastroesophageal reflux disease), H. pylori infection (10/2010), H/O exercise stress test (2018), Increased risk of breast cancer (11/2012; 4/19), Iron deficiency anemia, unspecified, Obesity, Seasonal allergies, Unspecified vitamin D deficiency, and Vitamin D deficiency (2017). She does not have any pertinent problems on file. She  has a past surgical history that includes Cesarean section; Wisdom tooth extraction; Dilatation & currettage/hysteroscopy with novasure ablation (N/A, 09/14/2015); Tubal ligation (Bilateral, 09/14/2015); Laparoscopic bilateral salpingectomy (Bilateral, 09/14/2015); Vulvar lesion removal (N/A, 09/14/2015); IUD removal (N/A, 09/14/2015); and Breast biopsy (Right, 1980s). Her family history includes Alcohol abuse in her father; Breast cancer (age of onset: 65) in her mother; Breast cancer (age of onset: 55) in her maternal grandmother; CAD in her brother; Cancer in her father; Esophageal cancer in her father; Heart disease (age  of onset: 47) in her mother; Heart disease (age of onset: 59) in her father; Hypertension in her brother, brother, and sister. She  reports that she has never smoked. She has never used smokeless tobacco. She reports that she does not drink alcohol and does not use drugs. She has a current medication list which includes the following prescription(s): albuterol, aspirin ec, cetirizine, repatha sureclick, famotidine, montelukast, omeprazole, and rosuvastatin. She is allergic to iodinated contrast media.       Review of Systems:  Review of Systems  Constitutional: Denied constitutional symptoms, night sweats, recent illness, fatigue, fever, insomnia and weight loss.  Eyes: Denied eye symptoms, eye pain, photophobia, vision change and visual disturbance.  Ears/Nose/Throat/Neck: Denied ear, nose, throat or neck symptoms, hearing loss, nasal discharge, sinus congestion and sore throat.  Cardiovascular: Denied cardiovascular symptoms, arrhythmia, chest pain/pressure, edema, exercise intolerance, orthopnea and palpitations.  Respiratory: Denied pulmonary symptoms, asthma, pleuritic pain, productive sputum, cough, dyspnea and wheezing.  Gastrointestinal: Denied, gastro-esophageal reflux, melena, nausea and vomiting.  Genitourinary: See HPI for additional information.  Musculoskeletal: Denied musculoskeletal symptoms, stiffness, swelling, muscle weakness and myalgia.  Dermatologic: Denied dermatology symptoms, rash and scar.  Neurologic: Denied neurology symptoms, dizziness, headache, neck pain and syncope.  Psychiatric: Denied psychiatric symptoms, anxiety and depression.  Endocrine: Denied endocrine symptoms including hot flashes and night sweats.   Meds:   Current Outpatient Medications on File Prior to Visit  Medication Sig Dispense Refill   albuterol (VENTOLIN HFA) 108 (90 Base) MCG/ACT inhaler Inhale 2 puffs into the lungs every 6 (six) hours as needed for wheezing or shortness of breath. 18 g 1  aspirin EC 81 MG tablet Take 1 tablet (81 mg total) by mouth daily. Swallow whole. 90 tablet 3   cetirizine (ZYRTEC) 10 MG tablet Take 1 tablet (10 mg total) by mouth 2 (two) times daily. 60 tablet 3   Evolocumab (REPATHA SURECLICK) 979 MG/ML SOAJ Inject 1 Pen into the skin every 14 (fourteen) days. 2 mL 11   famotidine (PEPCID) 20 MG tablet TAKE ONE TABLET BY MOUTH AFTER SUPPER 90 tablet 0   montelukast (SINGULAIR) 10 MG tablet Take 1 tablet (10 mg total) by mouth at bedtime. 30 tablet 5   omeprazole (PRILOSEC) 40 MG capsule TAKE 1 CAPSULE BY MOUTH EVERY DAY 90 capsule 1   rosuvastatin (CRESTOR) 10 MG tablet Take 1 tablet (10 mg total) by mouth daily. 90 tablet 3   No current facility-administered medications on file prior to visit.      Objective:     Vitals:   03/07/22 1512  BP: 110/80   Filed Weights   03/07/22 1512  Weight: 210 lb 9.6 oz (95.5 kg)                        Assessment:    [id number] Patient Active Problem List   Diagnosis Date Noted   Encounter for health maintenance examination in adult 11/27/2021   Need for Td vaccine 11/27/2021   Hyperlipidemia 09/25/2021   Hot flashes 06/12/2021   Snoring 06/12/2021   Palpitation 06/12/2021   Screening for diabetes mellitus 05/02/2021   Screening for lipid disorders 05/02/2021   Screening for thyroid disorder 05/02/2021   Encounter for annual physical exam 05/02/2021   Chest pain of uncertain etiology 17/40/8144   Perimenopausal vasomotor symptoms 04/24/2021   Chest discomfort 01/26/2021   Cough 01/26/2021   History of COVID-19 01/26/2021   BMI 37.0-37.9, adult 01/26/2021   Coronary artery disease involving native heart without angina pectoris 01/26/2021   Tightness in chest 01/26/2021   Breast tenderness in female 01/26/2021   Mass of left axilla 01/26/2021   CIN I (cervical intraepithelial neoplasia I) 11/07/2020   ASCUS with positive high risk HPV cervical 04/25/2020   Reactive airway disease 01/19/2020    Weight gain 01/19/2020   Wheezing 01/19/2020   Angular cheilosis 01/19/2020   Screening for heart disease 08/17/2019   Chronic constipation 06/28/2019   Flatulence 06/28/2019   Edema 06/28/2019   Gastroesophageal reflux disease 06/28/2019   Sinus pressure 04/03/2018   Sleep disturbance 04/03/2018   Acute right-sided back pain 04/03/2018   Polyuria 04/03/2018   Impaired fasting blood sugar 04/03/2018   History of tubal ligation 04/03/2018   Frequency of urination 01/28/2018   Increased risk of breast cancer    BRCA negative    Hip pain, right 04/23/2017   PVC (premature ventricular contraction) 03/05/2017   Family history of premature CAD 02/06/2017   Elevated blood-pressure reading without diagnosis of hypertension 02/06/2017   Heart palpitations 02/06/2017   Headache syndrome 02/06/2017   Other fatigue 02/06/2017   Daytime somnolence 02/06/2017   Family history of breast cancer 11/25/2016   Upper airway cough syndrome 09/04/2016   DOE (dyspnea on exertion) 09/04/2016   Female sterility 09/14/2015   Menorrhagia with regular cycle 09/14/2015   Uterine leiomyoma 09/14/2015   Ovarian cyst, right 09/14/2015   Vulvar lesion 09/14/2015   Chronic vaginitis 09/14/2015   Screening for hematuria or proteinuria 06/28/2015   Obesity with serious comorbidity 06/28/2015   Vitamin D deficiency 06/28/2015   Anemia, iron deficiency 06/28/2015  Microscopic hematuria 06/28/2015   Allergic rhinitis 03/31/2013     1. Establishing care with new doctor, encounter for   2. Fibroids     As pelvic pain has resolved, the patient is not having any bleeding and she has begun to have significant signs of menopause I believe that there is nothing to do at this time.  It is likely her fibroids will shrink because of menopause.  I have briefly spoken to her about hormone replacement therapy should she desire.  I have also encouraged her to keep up-to-date with Pap smears and mammograms.   Plan:             1.  I have reassured her as above.  2.  Nothing to do at this time -recommend patient follow-up for routine GYN care.  If pain reoccurs she will certainly inform us. Orders No orders of the defined types were placed in this encounter.   No orders of the defined types were placed in this encounter.     F/U  Return for Annual Physical. I spent 31 minutes involved in the care of this patient preparing to see the patient by obtaining and reviewing her medical history (including labs, imaging tests and prior procedures), documenting clinical information in the electronic health record (EHR), counseling and coordinating care plans, writing and sending prescriptions, ordering tests or procedures and in direct communicating with the patient and medical staff discussing pertinent items from her history and physical exam.  Finis Bud, M.D. 03/08/2022 8:04 AM

## 2022-03-20 ENCOUNTER — Encounter: Payer: Self-pay | Admitting: Internal Medicine

## 2022-04-02 ENCOUNTER — Ambulatory Visit: Payer: 59 | Attending: Cardiology

## 2022-04-02 DIAGNOSIS — E782 Mixed hyperlipidemia: Secondary | ICD-10-CM

## 2022-04-03 ENCOUNTER — Telehealth: Payer: Self-pay | Admitting: Pharmacist

## 2022-04-03 DIAGNOSIS — E782 Mixed hyperlipidemia: Secondary | ICD-10-CM

## 2022-04-03 DIAGNOSIS — I2510 Atherosclerotic heart disease of native coronary artery without angina pectoris: Secondary | ICD-10-CM

## 2022-04-03 LAB — HEPATIC FUNCTION PANEL
ALT: 73 IU/L — ABNORMAL HIGH (ref 0–32)
AST: 43 IU/L — ABNORMAL HIGH (ref 0–40)
Albumin: 4.2 g/dL (ref 3.9–4.9)
Alkaline Phosphatase: 92 IU/L (ref 44–121)
Bilirubin Total: 0.3 mg/dL (ref 0.0–1.2)
Bilirubin, Direct: 0.11 mg/dL (ref 0.00–0.40)
Total Protein: 6.9 g/dL (ref 6.0–8.5)

## 2022-04-03 LAB — LIPID PANEL
Chol/HDL Ratio: 2 ratio (ref 0.0–4.4)
Cholesterol, Total: 133 mg/dL (ref 100–199)
HDL: 65 mg/dL (ref >39)
LDL Chol Calc (NIH): 57 mg/dL (ref 0–99)
Triglycerides: 50 mg/dL (ref 0–149)
VLDL Cholesterol Cal: 11 mg/dL (ref 5–40)

## 2022-04-03 LAB — APOLIPOPROTEIN B: Apolipoprotein B: 53 mg/dL (ref <90)

## 2022-04-03 NOTE — Telephone Encounter (Signed)
-----   Message from Jerline Pain, MD sent at 04/03/2022  2:46 PM EDT ----- Agree that with LFT increase, decrease Crestor to 5 mg once a day.  Repeat hepatic panel in 1 month. Candee Furbish, MD

## 2022-04-03 NOTE — Telephone Encounter (Signed)
Lipids great, but LFT back up. Still less than 3 X ULN. Will decrease rosuvastatin to '5mg'$  and recheck LFT in 1 month Continue Repatha. Called pt and LVM for her to call back. Will also make sure pt isnt using APAP or drinking a lot of alcohol.

## 2022-04-04 NOTE — Addendum Note (Signed)
Addended by: Marcelle Overlie D on: 04/04/2022 12:19 PM   Modules accepted: Orders

## 2022-04-05 ENCOUNTER — Other Ambulatory Visit: Payer: Self-pay | Admitting: Obstetrics and Gynecology

## 2022-04-05 DIAGNOSIS — Z1231 Encounter for screening mammogram for malignant neoplasm of breast: Secondary | ICD-10-CM

## 2022-04-14 ENCOUNTER — Other Ambulatory Visit: Payer: Self-pay | Admitting: Medical

## 2022-04-14 DIAGNOSIS — R058 Other specified cough: Secondary | ICD-10-CM

## 2022-04-19 ENCOUNTER — Other Ambulatory Visit: Payer: Self-pay | Admitting: Podiatry

## 2022-04-19 DIAGNOSIS — G5762 Lesion of plantar nerve, left lower limb: Secondary | ICD-10-CM

## 2022-04-23 ENCOUNTER — Encounter: Payer: Self-pay | Admitting: Internal Medicine

## 2022-04-23 ENCOUNTER — Telehealth: Payer: Self-pay | Admitting: Pharmacist

## 2022-04-23 NOTE — Telephone Encounter (Addendum)
Patient called saying her copay card isnt working for [place]. I asked her to call [phone] Repatha to see what the issue is. I thought she was supposed to get up to 6 months at $5 before $200 limit set in. Patient to call me if there are still issues.

## 2022-04-26 ENCOUNTER — Other Ambulatory Visit: Payer: Self-pay | Admitting: Allergy

## 2022-04-26 MED ORDER — REPATHA SURECLICK 140 MG/ML ~~LOC~~ SOAJ: 1.0000 mL | SUBCUTANEOUS | 3 refills | Status: DC

## 2022-04-26 NOTE — Telephone Encounter (Signed)
Patient did exhaust her 3 months on copay card, but apparently her copay for 1 month is 500 but her copay for 90 days in $250. Copay card will take off 200. Pt request a 90 day supply. Rx sent.

## 2022-04-26 NOTE — Telephone Encounter (Signed)
Patient called again saying pharmacy says I have to do a PA to get the 90 days supply.  I called insurance. They are saying that patient has to pay a % of cost for 30 day supply. After 2 more fills she will be eligible for the [facility] discount of 84 day supply for $250. If we want to try to get that discount now, have to do a quantity limits exception. This was done over the phone. [id number]  Insurance canceled request saying PA already on file. I called insurance again and spoke to several people before finally getting someone to do a quantity limits exception. They stated it was approved.  Patient made aware to ask pharmacy to re-run rx.

## 2022-04-26 NOTE — Addendum Note (Signed)
Addended by: Marcelle Overlie D on: 04/26/2022 12:20 PM   Modules accepted: Orders

## 2022-04-29 NOTE — Telephone Encounter (Signed)
Pt and [employer] called again saying that PA done on Friday over the phone- person chose syringe and not sureclick. Another PA done over the phone. Override was applied and pt was able to get 90 day supply

## 2022-04-30 ENCOUNTER — Ambulatory Visit: Payer: 59 | Admitting: Obstetrics and Gynecology

## 2022-05-01 ENCOUNTER — Ambulatory Visit: Payer: 59 | Attending: Cardiology

## 2022-05-01 DIAGNOSIS — E782 Mixed hyperlipidemia: Secondary | ICD-10-CM

## 2022-05-01 DIAGNOSIS — I2510 Atherosclerotic heart disease of native coronary artery without angina pectoris: Secondary | ICD-10-CM

## 2022-05-01 LAB — HEPATIC FUNCTION PANEL
ALT: 95 IU/L — ABNORMAL HIGH (ref 0–32)
AST: 58 IU/L — ABNORMAL HIGH (ref 0–40)
Albumin: 4 g/dL (ref 3.9–4.9)
Alkaline Phosphatase: 114 IU/L (ref 44–121)
Bilirubin Total: 0.3 mg/dL (ref 0.0–1.2)
Bilirubin, Direct: 0.12 mg/dL (ref 0.00–0.40)
Total Protein: 6.7 g/dL (ref 6.0–8.5)

## 2022-05-02 ENCOUNTER — Telehealth: Payer: Self-pay | Admitting: Pharmacist

## 2022-05-02 DIAGNOSIS — E782 Mixed hyperlipidemia: Secondary | ICD-10-CM

## 2022-05-02 DIAGNOSIS — I2510 Atherosclerotic heart disease of native coronary artery without angina pectoris: Secondary | ICD-10-CM

## 2022-05-02 NOTE — Telephone Encounter (Signed)
LFT keep increasing. Called pt. Will stop rosuvastatin. Recheck LFT and lipids in 1 month.

## 2022-05-13 NOTE — Progress Notes (Unsigned)
No chief complaint on file.   HPI:      [name] is a 51 y.o. [phone] who LMP was No LMP recorded. Patient has had an ablation., presents today for her annual examination. Her menses have been absent since novasure ablation. No BTB, no dysmen. Has mult small leio on u/s (1.4 cm-2.2 cm).  Having significant vasomotor sx. Would prefer non-hormonal tx.    Sex activity: sex active, contraception - vasectomy  Last Pap: 04/24/21 Results are ASCUS/neg HPV DNA; 04/05/20  Results were: ASCUS/POS HPV DNA; colpo with CIN 1 on bx with Dr. Kenton Kingfisher 10/21; 6 mo repeat pap 4/22 was neg cells (no HPV DNA done)  Hx of STDs: HPV on pap  She has a hx of BV and AV in past.   Last mammogram: 04/24/21  Results were: normal--routine follow-up in 12 months.; has appt today  There is a FH of breast cancer in her mom and MGM. There is no FH of ovarian cancer. The patient does do self-breast exams. Pt was BRCA neg 5/14. Myrisk neg 2019. IBIS=25.8%. Has not had scr breast MRI. Has had tender area LT breast since last mammo. No masses but can feel area; no change in size. Drinks a lot of caffeine.    Tobacco use: The patient denies current or previous tobacco use. Alcohol TKZ:SWFU No drug use. Exercise: occas active  Colonoscopy: 2021 per pt with polyps, repeat due after 5 yrs per pt. (Can't find records in [place])   She does get adequate calcium and Vitamin D in her diet.   Labs with PCP  Past Medical History:  Diagnosis Date   Allergy    BRCA negative 11/2012, 4/19   BRCA neg 2014; MyRisk update neg except POLE VUS 4/19   Dysrhythmia    a. 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 pylori infection 10/2010   H/O exercise stress test 2018   Increased risk of breast cancer 11/2012; 4/19   5/14 IBIS=25.6%; 4/19 IBIS=25.8%   Iron deficiency anemia, unspecified     history of, normal hemoglobin 06/2019   Obesity    Seasonal allergies    ragweed, tree pollen, mild and dust per testing 08/2012   Unspecified vitamin D deficiency    Vitamin D deficiency 2017    Past Surgical History:  Procedure Laterality Date   BREAST BIOPSY Right 1980s   neg   [place] N/A 09/14/2015   Procedure: DILATATION & CURETTAGE/HYSTEROSCOPY WITH NOVASURE ABLATION;  Surgeon: Gae Dry, MD;  Location: [facility];  Service: Gynecology;  Laterality: N/A;   IUD REMOVAL N/A 09/14/2015   Procedure: INTRAUTERINE DEVICE (IUD) REMOVAL;  Surgeon: Gae Dry, MD;  Location: [facility];  Service: Gynecology;  Laterality: N/A;   LAPAROSCOPIC BILATERAL SALPINGECTOMY Bilateral 09/14/2015   Procedure: LAPAROSCOPIC BILATERAL SALPINGECTOMY;  Surgeon: Gae Dry, MD;  Location: [facility];  Service: Gynecology;  Laterality: Bilateral;   TUBAL LIGATION Bilateral 09/14/2015   Procedure: BILATERAL TUBAL LIGATION;  Surgeon: Gae Dry, MD;  Location: [facility];  Service: Gynecology;  Laterality: Bilateral;   VULVAR LESION REMOVAL N/A 09/14/2015   Procedure: VULVAR LESION;  Surgeon: Gae Dry, MD;  Location: [facility];  Service: Gynecology;  Laterality: N/A;   WISDOM TOOTH EXTRACTION  Family History  Problem Relation Age of Onset   Heart disease Mother 70       stent, CAD   Breast cancer Mother 69       with recurrence about 83   Cancer Father        esophageal cancer   Heart disease Father 74       died of MI   Alcohol abuse Father    Esophageal cancer Father    Hypertension Sister    Hypertension Brother    CAD Brother        MI at age 11   Hypertension Brother    Breast cancer Maternal Grandmother 83   Diabetes Neg Hx    Stroke Neg Hx    Colon cancer Neg Hx    Colon polyps Neg Hx    Stomach cancer Neg Hx    Rectal cancer Neg Hx     Social History   Socioeconomic History   Marital status:  Single    Spouse name: Not on file   Number of children: Not on file   Years of education: Not on file   Highest education level: Not on file  Occupational History   Not on file  Tobacco Use   Smoking status: Never   Smokeless tobacco: Never  Vaping Use   Vaping Use: Never used  Substance and Sexual Activity   Alcohol use: No    Comment: maybe one drink a year   Drug use: No   Sexual activity: Yes    Birth control/protection: Surgical    Comment: reports ablation and partner had vasectomy.   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 18 g 1   aspirin EC 81 MG tablet Take 1 tablet (81 mg total) by mouth daily. Swallow whole. 90 tablet 3   cetirizine (ZYRTEC) 10 MG tablet Take 1 tablet (10 mg total) by mouth 2 (two) times daily. 60 tablet 3   Evolocumab (REPATHA SURECLICK) 100 MG/ML SOAJ Inject 140 mg into the skin every 14 (fourteen) days. 6 mL 3   famotidine (PEPCID) 20 MG tablet TAKE ONE TABLET BY MOUTH AFTER SUPPER 90 tablet 0   montelukast (SINGULAIR) 10 MG tablet Take 1 tablet (10 mg total) by mouth at bedtime. 30 tablet 5   omeprazole (PRILOSEC) 40 MG capsule TAKE 1 CAPSULE BY MOUTH EVERY DAY 90 capsule 1   No current facility-administered medications on file prior to visit.     ROS:  Review of Systems  Constitutional:  Negative for  fatigue, fever and unexpected weight change.  Respiratory:  Negative for cough, shortness of breath and wheezing.   Cardiovascular:  Negative for chest pain, palpitations and leg swelling.  Gastrointestinal:  Negative for blood in stool, constipation, diarrhea, nausea and vomiting.  Endocrine: Negative for  cold intolerance, heat intolerance and polyuria.  Genitourinary:  Negative for dyspareunia, dysuria, flank pain, frequency, genital sores, hematuria, menstrual problem, pelvic pain, urgency, vaginal bleeding, vaginal discharge and vaginal pain.  Musculoskeletal:  Negative for back pain, joint swelling and myalgias.  Skin:  Negative for rash.  Neurological:  Negative for dizziness, syncope, light-headedness, numbness and headaches.  Hematological:  Negative for adenopathy.  Psychiatric/Behavioral:  Negative for agitation, confusion, sleep disturbance and suicidal ideas. The patient is not nervous/anxious.   BREAST: lump and tenderness   Objective: There were no vitals taken for this visit.   Physical Exam Constitutional:      Appearance: She is well-developed.  Genitourinary:     Vulva normal.     Right Labia: No rash, tenderness or lesions.    Left Labia: No tenderness, lesions or rash.    No vaginal discharge, erythema or tenderness.      Right Adnexa: not tender and no mass present.    Left Adnexa: not tender and no mass present.    No cervical friability or polyp.     Uterus is not enlarged or tender.  Breasts:    Right: No mass, nipple discharge, skin change or tenderness.     Left: No mass, nipple discharge, skin change or tenderness.  Neck:     Thyroid: No thyromegaly.  Cardiovascular:     Rate and Rhythm: Normal rate and regular rhythm.     Heart sounds: Normal heart sounds. No murmur heard. Pulmonary:     Effort: Pulmonary effort is normal.     Breath sounds: Normal breath sounds.  Abdominal:     Palpations: Abdomen is soft.     Tenderness: There is no abdominal  tenderness. There is no guarding or rebound.  Musculoskeletal:        General: Normal range of motion.     Cervical back: Normal range of motion.  Lymphadenopathy:     Cervical: No cervical adenopathy.  Neurological:     General: No focal deficit present.     Mental Status: She is alert and oriented to person, place, and time.     Cranial Nerves: No cranial nerve deficit.  Skin:    General: Skin is warm and dry.  Psychiatric:        Mood and Affect: Mood normal.        Behavior: Behavior normal.        Thought Content: Thought content normal.        Judgment: Judgment normal.  Vitals reviewed.     Assessment/Plan: Encounter for annual routine gynecological examination  Cervical cancer screening - Plan: Cytology - PAP  Screening for HPV (human papillomavirus) - Plan: Cytology - PAP  Dysplasia of cervix, low grade (CIN 1) - Plan: Cytology - PAP; repeat pap today  Encounter for screening mammogram for malignant neoplasm of breast - Plan: MM 3D SCREEN BREAST BILATERAL; pt to sched mammo  Family history of breast cancer - Plan: MM 3D SCREEN BREAST BILATERAL; pt is MyRisk neg  Increased risk of breast cancer - Plan: MM 3D SCREEN BREAST BILATERAL; pt aware of monthly SBE, yearly CBE and mammos, as well as scr breast MRI. Will call for ref prn.   Breast tenderness--LT breast since last mammo; neg breast exam for masses. D/C caffeine, cont screening mammo 11/22. F/u prn.   Perimenopausal vasomotor symptoms--discussed tx options. Pt will try estroven  Blood tests for routine general physical examination - Plan: Comprehensive metabolic panel, Lipid panel, Lipid panel, Comprehensive  metabolic panel  Screening cholesterol level - Plan: Lipid panel, Lipid panel  Elevated LDL cholesterol level - Plan: Lipid panel, Lipid panel  BMI 37.0-37.9, adult - Plan: Comprehensive metabolic panel, Lipid panel, Lipid panel, Comprehensive metabolic panel         GYN counsel breast self exam,  mammography screening, adequate intake of calcium and vitamin D, diet and exercise     F/U  No follow-ups on file.   Lemmie Vanlanen B. Lyrik Buresh, PA-C 05/13/2022 2:00 PM

## 2022-05-14 ENCOUNTER — Ambulatory Visit (INDEPENDENT_AMBULATORY_CARE_PROVIDER_SITE_OTHER): Payer: 59 | Admitting: Obstetrics and Gynecology

## 2022-05-14 ENCOUNTER — Other Ambulatory Visit (HOSPITAL_COMMUNITY)
Admission: RE | Admit: 2022-05-14 | Discharge: 2022-05-14 | Disposition: A | Discharge status: Home / Self Care | Payer: 59 | Source: Ambulatory Visit | Attending: Obstetrics and Gynecology | Admitting: Obstetrics and Gynecology

## 2022-05-14 ENCOUNTER — Ambulatory Visit
Admission: RE | Admit: 2022-05-14 | Discharge: 2022-05-14 | Disposition: A | Discharge status: Home / Self Care | Payer: 59 | Source: Ambulatory Visit | Attending: Obstetrics and Gynecology | Admitting: Obstetrics and Gynecology

## 2022-05-14 ENCOUNTER — Encounter: Payer: Self-pay | Admitting: Obstetrics and Gynecology

## 2022-05-14 VITALS — BP 120/80 | Ht 65.0 in | Wt 216.0 lb

## 2022-05-14 DIAGNOSIS — R3121 Asymptomatic microscopic hematuria: Secondary | ICD-10-CM | POA: Diagnosis not present

## 2022-05-14 DIAGNOSIS — Z1151 Encounter for screening for human papillomavirus (HPV): Secondary | ICD-10-CM

## 2022-05-14 DIAGNOSIS — Z1231 Encounter for screening mammogram for malignant neoplasm of breast: Secondary | ICD-10-CM

## 2022-05-14 DIAGNOSIS — Z9189 Other specified personal risk factors, not elsewhere classified: Secondary | ICD-10-CM

## 2022-05-14 DIAGNOSIS — N870 Mild cervical dysplasia: Secondary | ICD-10-CM | POA: Insufficient documentation

## 2022-05-14 DIAGNOSIS — Z124 Encounter for screening for malignant neoplasm of cervix: Secondary | ICD-10-CM | POA: Diagnosis present

## 2022-05-14 DIAGNOSIS — R350 Frequency of micturition: Secondary | ICD-10-CM | POA: Diagnosis not present

## 2022-05-14 DIAGNOSIS — Z01419 Encounter for gynecological examination (general) (routine) without abnormal findings: Secondary | ICD-10-CM | POA: Diagnosis not present

## 2022-05-14 DIAGNOSIS — Z803 Family history of malignant neoplasm of breast: Secondary | ICD-10-CM

## 2022-05-14 DIAGNOSIS — N951 Menopausal and female climacteric states: Secondary | ICD-10-CM

## 2022-05-14 LAB — POCT URINALYSIS DIPSTICK
Bilirubin, UA: NEGATIVE
Glucose, UA: NEGATIVE
Ketones, UA: NEGATIVE
Leukocytes, UA: NEGATIVE
Nitrite, UA: NEGATIVE
Protein, UA: NEGATIVE
Spec Grav, UA: 1.025 (ref 1.010–1.025)
pH, UA: 5 (ref 5.0–8.0)

## 2022-05-14 NOTE — Patient Instructions (Signed)
I value your feedback and you entrusting us with your care. If you get a [facility] patient survey, I would appreciate you taking the time to let us know about your experience today. Thank you! ? ? ?

## 2022-05-16 LAB — CYTOLOGY - PAP
Comment: NEGATIVE
Diagnosis: NEGATIVE
High risk HPV: NEGATIVE

## 2022-05-16 LAB — URINE CULTURE

## 2022-05-16 NOTE — Addendum Note (Signed)
Addended by: Ardeth Perfect B on: 50/10/1362 11:51 AM   Modules accepted: Orders

## 2022-05-21 ENCOUNTER — Ambulatory Visit: Payer: 59 | Admitting: Medical

## 2022-05-21 ENCOUNTER — Institutional Professional Consult (permissible substitution): Payer: 59 | Admitting: Medical

## 2022-05-21 ENCOUNTER — Ambulatory Visit
Admission: RE | Admit: 2022-05-21 | Discharge: 2022-05-21 | Disposition: A | Discharge status: Home / Self Care | Payer: 59 | Source: Ambulatory Visit | Attending: Podiatry | Admitting: Podiatry

## 2022-05-21 VITALS — BP 122/82 | HR 64 | Wt 216.0 lb

## 2022-05-21 DIAGNOSIS — G5762 Lesion of plantar nerve, left lower limb: Secondary | ICD-10-CM

## 2022-05-21 DIAGNOSIS — L409 Psoriasis, unspecified: Secondary | ICD-10-CM | POA: Diagnosis not present

## 2022-05-21 DIAGNOSIS — M2550 Pain in unspecified joint: Secondary | ICD-10-CM

## 2022-05-21 DIAGNOSIS — M2540 Effusion, unspecified joint: Secondary | ICD-10-CM | POA: Diagnosis not present

## 2022-05-21 NOTE — Progress Notes (Signed)
Subjective:  Kristy [name] is a 51 y.o. female who presents for Chief Complaint  Patient presents with   wants autoimmune testing    Autoimmune testing due to joint pain, swelling.      Here for concern for autoimmune testing.  Has joint pains, joint swelling .  Hands been bothering her a lot lately.  She is hair stylist.  Right handed.     Gets pain in both hands, left foot, gets swelling in both hand knuckles.   No wrist pain or swelling.  Can't close hands sometimes due to pain and swelling.  Puts hands under hot water sometimes in the morning.   Does get some morning stiffness in body in general.   Pain in hands worse in morning and is always there, but maybe better as the day goes on.  Sometimes can't close fist all the way  Grandmother maybe had rheumatoid issues, but no other family hx/o rheumatoid condition.    No fever.  No other aggravating or relieving factors.    No other c/o.  Past Medical History:  Diagnosis Date   Allergy    BRCA negative 11/2012, 4/19   BRCA neg 2014; MyRisk update neg except POLE VUS 4/19   Dysrhythmia    a. 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 pylori infection 10/2010   H/O exercise stress test 2018   Increased risk of breast cancer 11/2012; 4/19   5/14 IBIS=25.6%; 4/19 IBIS=25.8%   Iron deficiency anemia, unspecified    history of, normal hemoglobin 06/2019   Obesity    Seasonal allergies    ragweed, tree pollen, mild and dust per testing 08/2012   Unspecified vitamin D deficiency    Vitamin D deficiency 2017   Current Outpatient Medications on File Prior to Visit  Medication Sig Dispense Refill   albuterol (VENTOLIN HFA) 108 (90 Base) MCG/ACT inhaler Inhale 2 puffs into the lungs every 6 (six) hours as needed for wheezing or shortness of breath. 18 g 1   aspirin EC 81 MG tablet Take 1 tablet (81 mg total) by  mouth daily. Swallow whole. 90 tablet 3   Azelaic Acid 15 % gel Apply topically at bedtime.     cetirizine (ZYRTEC) 10 MG tablet Take 1 tablet (10 mg total) by mouth 2 (two) times daily. 60 tablet 3   Evolocumab (REPATHA SURECLICK) 678 MG/ML SOAJ Inject 140 mg into the skin every 14 (fourteen) days. 6 mL 3   famotidine (PEPCID) 20 MG tablet TAKE ONE TABLET BY MOUTH AFTER SUPPER 90 tablet 0   levocetirizine (XYZAL) 5 MG tablet SMARTSIG:1 Tablet(s) By Mouth Every Evening     omeprazole (PRILOSEC) 40 MG capsule TAKE 1 CAPSULE BY MOUTH EVERY DAY 90 capsule 1   No current facility-administered medications on file prior to visit.   The following portions of the patient's history were reviewed and updated as appropriate: allergies, current medications, past family history, past medical history, past social history, past surgical history and problem list.  ROS Otherwise as in subjective above  Objective: BP 122/82   Pulse 64   Wt 216 lb (98 kg)   BMI 35.94 kg/m   General appearance: alert, no distress, well developed, well nourished MSK: Tender over the right third fourth MCP with localized swelling in the same area, tender over several MCPs on the left hand, positive  squeeze test causing pain, no other hand swelling, wrist nontender with normal range of motion, no diffuse generalized swelling of the fingers no sausage fingers, and no major arthritic changes but there is some bone near tender findings at left middle finger MCP and right third and fourth MCPs, there is also tenderness and localized swelling of left foot second and third MTP otherwise feet unremarkable Pulses: 2+ radial pulses, 2+ pedal pulses, normal cap refill Ext: no edema   Assessment: Encounter Diagnoses  Name Primary?   Polyarthralgia Yes   Psoriasis    Joint swelling      Plan: We discussed concerns and symptoms, we discussed difference between osteoarthritis and rheumatoid conditions.  We also discussed rheumatoid  arthritis, psoriatic arthritis, gout and other rheumatoid conditions.  Labs screening as below.  We discussed topical treatment such as capsaicin cream, Aspercreme, Voltaren gel  We discussed oral medications such as Tylenol, short term use of NSAIDs for worse flareups.  We discussed risk and benefits of medication.  Kristy [name] was seen today for wants autoimmune testing.  Diagnoses and all orders for this visit:  Polyarthralgia -     Uric acid -     ANA -     CYCLIC CITRUL PEPTIDE ANTIBODY, IGG/IGA -     Sedimentation rate -     Rheumatoid factor  Psoriasis -     Uric acid -     ANA -     CYCLIC CITRUL PEPTIDE ANTIBODY, IGG/IGA -     Sedimentation rate -     Rheumatoid factor  Joint swelling -     Uric acid -     ANA -     CYCLIC CITRUL PEPTIDE ANTIBODY, IGG/IGA -     Sedimentation rate -     Rheumatoid factor    Follow up: pending labs

## 2022-05-22 LAB — ANA: Anti Nuclear Antibody (ANA): NEGATIVE

## 2022-05-22 LAB — SEDIMENTATION RATE: Sed Rate: 16 mm/hr (ref 0–32)

## 2022-05-22 LAB — CYCLIC CITRUL PEPTIDE ANTIBODY, IGG/IGA: Cyclic Citrullin Peptide Ab: >250 units — ABNORMAL HIGH (ref 0–19)

## 2022-05-22 LAB — URIC ACID: Uric Acid: 3.5 mg/dL (ref 2.6–6.2)

## 2022-05-22 LAB — RHEUMATOID FACTOR: Rheumatoid fact SerPl-aCnc: 79.5 IU/mL — ABNORMAL HIGH (ref <14.0)

## 2022-05-23 ENCOUNTER — Other Ambulatory Visit: Payer: Self-pay | Admitting: Medical

## 2022-05-23 ENCOUNTER — Other Ambulatory Visit: Payer: Self-pay | Admitting: Cardiology

## 2022-05-23 DIAGNOSIS — M2550 Pain in unspecified joint: Secondary | ICD-10-CM

## 2022-05-23 DIAGNOSIS — M2540 Effusion, unspecified joint: Secondary | ICD-10-CM

## 2022-05-23 DIAGNOSIS — M2560 Stiffness of unspecified joint, not elsewhere classified: Secondary | ICD-10-CM

## 2022-05-29 ENCOUNTER — Ambulatory Visit: Payer: 59 | Attending: Cardiology

## 2022-05-29 DIAGNOSIS — E782 Mixed hyperlipidemia: Secondary | ICD-10-CM

## 2022-05-29 DIAGNOSIS — I2510 Atherosclerotic heart disease of native coronary artery without angina pectoris: Secondary | ICD-10-CM

## 2022-05-30 ENCOUNTER — Telehealth: Payer: Self-pay | Admitting: *Deleted

## 2022-05-30 ENCOUNTER — Telehealth: Payer: Self-pay | Admitting: Pharmacist

## 2022-05-30 DIAGNOSIS — R7989 Other specified abnormal findings of blood chemistry: Secondary | ICD-10-CM

## 2022-05-30 LAB — HEPATIC FUNCTION PANEL
ALT: 60 IU/L — ABNORMAL HIGH (ref 0–32)
AST: 37 IU/L (ref 0–40)
Albumin: 3.9 g/dL (ref 3.9–4.9)
Alkaline Phosphatase: 120 IU/L (ref 44–121)
Bilirubin Total: 0.3 mg/dL (ref 0.0–1.2)
Bilirubin, Direct: <0.1 mg/dL (ref 0.00–0.40)
Total Protein: 6.8 g/dL (ref 6.0–8.5)

## 2022-05-30 LAB — LIPID PANEL
Chol/HDL Ratio: 3.1 ratio (ref 0.0–4.4)
Cholesterol, Total: 198 mg/dL (ref 100–199)
HDL: 63 mg/dL (ref >39)
LDL Chol Calc (NIH): 124 mg/dL — ABNORMAL HIGH (ref 0–99)
Triglycerides: 60 mg/dL (ref 0–149)
VLDL Cholesterol Cal: 11 mg/dL (ref 5–40)

## 2022-05-30 LAB — APOLIPOPROTEIN B: Apolipoprotein B: 95 mg/dL — ABNORMAL HIGH (ref <90)

## 2022-05-30 NOTE — Telephone Encounter (Signed)
Liver enzymes, ALT remain elevated despite being off of statin therapy and Zetia.  Last check it was 60.  Lets go ahead and refer to GI for further evaluation.   Kristy Furbish, MD   Order placed for referral to GI for further evaluation of elevated liver functions

## 2022-05-30 NOTE — Telephone Encounter (Signed)
Spoke with patient about her labs. ALT still elevated despite being off of rosuvastatin. Her LFT normalized when off both zetia and rosuvastatin. Rosuvastatin was resumed and Repatha started. AST was 43, ALT 73. Rosuvastatin was then decreased to '5mg'$  daily. ALT increased to 95, AST 58. Rosuvastatin was stopped completely. AST normalized and ALT decreased to only 60 after 1 month. Patient does admit to some tylenol use. Sounds like she takes '1000mg'$  at a time, but only 2-3 doses every few days. Took maybe '3000mg'$  in the last 3 days.  Has been taking a few supplements. Started Estroven  in the last 2 weeks. Healing n sooth took for a few days right before labs.  Aleve '800mg'$  daily previously Meloxicam the last few days Was in semagultide trial, stopped about 1 month ago- doesn't even know if she had active drug Denies any abdominal pain. No alcohol Discussed with Dr. Marlou Porch and will send to GI for workup. Will hold off adding nexletol until pt sees GI.

## 2022-06-20 ENCOUNTER — Encounter: Payer: Self-pay | Admitting: Medical

## 2022-06-20 ENCOUNTER — Ambulatory Visit: Payer: 59 | Admitting: Medical

## 2022-06-20 VITALS — BP 122/90 | HR 76 | Wt 225.0 lb

## 2022-06-20 DIAGNOSIS — J3089 Other allergic rhinitis: Secondary | ICD-10-CM | POA: Diagnosis not present

## 2022-06-20 DIAGNOSIS — J4540 Moderate persistent asthma, uncomplicated: Secondary | ICD-10-CM

## 2022-06-20 DIAGNOSIS — K219 Gastro-esophageal reflux disease without esophagitis: Secondary | ICD-10-CM | POA: Diagnosis not present

## 2022-06-20 MED ORDER — ALBUTEROL SULFATE HFA 108 (90 BASE) MCG/ACT IN AERS: 2.0000 | INHALATION_SPRAY | Freq: Four times a day (QID) | RESPIRATORY_TRACT | 0 refills | Status: DC | PRN

## 2022-06-20 MED ORDER — MONTELUKAST SODIUM 10 MG PO TABS: 10.0000 mg | ORAL_TABLET | Freq: Every day | ORAL | 0 refills | Status: DC

## 2022-06-20 MED ORDER — AMOXICILLIN 875 MG PO TABS: 875.0000 mg | ORAL_TABLET | Freq: Two times a day (BID) | ORAL | 0 refills | Status: AC

## 2022-06-20 NOTE — Progress Notes (Signed)
Subjective:  Kristy [name] is a 51 y.o. female who presents for Chief Complaint  Patient presents with   Mucus production    Patient reports excessive mucus production x 1 month.  Mucus is described as thick and yellow colored.      Here for concern for mucous.   She notes having excessive mucous for 1 months.  Has had some cough but attributed this to acid reflux.  She reports to is good has nasal congestion but has not been sick.  No recent cold or illness.  She does note history of allergic rhinitis, allergic to several things.  She has seen pulmonology in the past as well for similar symptoms.  Currently just mainly drainage, mucous and cough.  She is using some Mucinex she started about a week ago.  Feels like she cannot get all the mucus up at times though.  No blood in the mucus, no smoker, lifetime non-smoker.  No recent environmental triggers.  No other aggravating or relieving factors.    No other c/o.  Past Medical History:  Diagnosis Date   Allergy    BRCA negative 11/2012, 4/19   BRCA neg 2014; MyRisk update neg except POLE VUS 4/19   Dysrhythmia    a. 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 pylori infection 10/2010   H/O exercise stress test 2018   Increased risk of breast cancer 11/2012; 4/19   5/14 IBIS=25.6%; 4/19 IBIS=25.8%   Iron deficiency anemia, unspecified    history of, normal hemoglobin 06/2019   Obesity    Seasonal allergies    ragweed, tree pollen, mild and dust per testing 08/2012   Unspecified vitamin D deficiency    Vitamin D deficiency 2017   Current Outpatient Medications on File Prior to Visit  Medication Sig Dispense Refill   aspirin EC 81 MG tablet Take 1 tablet (81 mg total) by mouth daily. Swallow whole. 90 tablet 3   Azelaic Acid 15 % gel Apply topically at bedtime.     cetirizine (ZYRTEC) 10 MG tablet Take 1  tablet (10 mg total) by mouth 2 (two) times daily. 60 tablet 3   Evolocumab (REPATHA SURECLICK) 637 MG/ML SOAJ Inject 140 mg into the skin every 14 (fourteen) days. 6 mL 3   famotidine (PEPCID) 20 MG tablet TAKE ONE TABLET BY MOUTH AFTER SUPPER 90 tablet 0   omeprazole (PRILOSEC) 40 MG capsule TAKE 1 CAPSULE BY MOUTH EVERY DAY 90 capsule 1   albuterol (VENTOLIN HFA) 108 (90 Base) MCG/ACT inhaler Inhale 2 puffs into the lungs every 6 (six) hours as needed for wheezing or shortness of breath. (Patient not taking: Reported on 06/20/2022) 18 g 1   levocetirizine (XYZAL) 5 MG tablet SMARTSIG:1 Tablet(s) By Mouth Every Evening (Patient not taking: Reported on 06/20/2022)     No current facility-administered medications on file prior to visit.     The following portions of the patient's history were reviewed and updated as appropriate: allergies, current medications, past family history, past medical history, past social history, past surgical history and problem list.  ROS Otherwise as in subjective above  Objective: BP (!) 122/90   Pulse 76   Wt 225 lb (102.1 kg)   SpO2 99% Comment: room air  BMI 37.44 kg/m   General appearance: alert, no distress, well developed, well nourished HEENT: normocephalic, sclerae anicteric, conjunctiva  pink and moist, TMs pearly, nares patent, no discharge or erythema, pharynx normal Oral cavity: MMM, no lesions Neck: supple, no lymphadenopathy, no thyromegaly, no masses Heart: RRR, normal S1, S2, no murmurs Lungs: CTA bilaterally, no wheezes, rhonchi, or rales Pulses: 2+ radial pulses, 2+ pedal pulses, normal cap refill Ext: no edema   Assessment: Encounter Diagnoses  Name Primary?   Gastroesophageal reflux disease, unspecified whether esophagitis present Yes   Moderate persistent reactive airway disease without complication    Non-seasonal allergic rhinitis due to other allergic trigger      Plan: Looking back in the chart record she has been seen  the past 3 winters for similar respiratory flareup during the cold winter months.  I suspect more of an reactive airway from cold air and possible other environmental triggers in the winter.  She has done better on Singulair plus allergy pill in the past.   we will add back Singulair.  Continue her routine medicines for acid reflux.  Refilled albuterol to use twice a day for the next week or up to 4 times a day if needed.  Given the thicker mucus we will go ahead and do a round of antibiotic.  Advise she use a similar regimen next fall, Thanksgiving time.,  Start back on similar regimen then going into the cold winter months.   Inice was seen today for mucus production.  Diagnoses and all orders for this visit:  Gastroesophageal reflux disease, unspecified whether esophagitis present  Moderate persistent reactive airway disease without complication  Non-seasonal allergic rhinitis due to other allergic trigger  Other orders -     montelukast (SINGULAIR) 10 MG tablet; Take 1 tablet (10 mg total) by mouth at bedtime. -     albuterol (VENTOLIN HFA) 108 (90 Base) MCG/ACT inhaler; Inhale 2 puffs into the lungs every 6 (six) hours as needed for wheezing or shortness of breath. -     amoxicillin (AMOXIL) 875 MG tablet; Take 1 tablet (875 mg total) by mouth 2 (two) times daily for 10 days.    Follow up: prn

## 2022-07-03 ENCOUNTER — Encounter (HOSPITAL_BASED_OUTPATIENT_CLINIC_OR_DEPARTMENT_OTHER): Payer: Self-pay

## 2022-07-03 ENCOUNTER — Other Ambulatory Visit: Payer: Self-pay

## 2022-07-03 DIAGNOSIS — Z7982 Long term (current) use of aspirin: Secondary | ICD-10-CM | POA: Insufficient documentation

## 2022-07-03 DIAGNOSIS — R1110 Vomiting, unspecified: Secondary | ICD-10-CM | POA: Diagnosis present

## 2022-07-03 DIAGNOSIS — J45909 Unspecified asthma, uncomplicated: Secondary | ICD-10-CM | POA: Insufficient documentation

## 2022-07-03 LAB — CBC
HCT: 40.5 % (ref 36.0–46.0)
Hemoglobin: 13.4 g/dL (ref 12.0–15.0)
MCH: 28.6 pg (ref 26.0–34.0)
MCHC: 33.1 g/dL (ref 30.0–36.0)
MCV: 86.5 fL (ref 80.0–100.0)
Platelets: 272 10*3/uL (ref 150–400)
RBC: 4.68 MIL/uL (ref 3.87–5.11)
RDW: 14 % (ref 11.5–15.5)
WBC: 7.5 10*3/uL (ref 4.0–10.5)
nRBC: 0 % (ref 0.0–0.2)

## 2022-07-03 LAB — COMPREHENSIVE METABOLIC PANEL
ALT: 26 U/L (ref 0–44)
AST: 21 U/L (ref 15–41)
Albumin: 4.2 g/dL (ref 3.5–5.0)
Alkaline Phosphatase: 81 U/L (ref 38–126)
Anion gap: 12 (ref 5–15)
BUN: 13 mg/dL (ref 6–20)
CO2: 26 mmol/L (ref 22–32)
Calcium: 9.7 mg/dL (ref 8.9–10.3)
Chloride: 103 mmol/L (ref 98–111)
Creatinine, Ser: 0.74 mg/dL (ref 0.44–1.00)
GFR, Estimated: >60 mL/min (ref >60)
Glucose, Bld: 96 mg/dL (ref 70–99)
Potassium: 3.9 mmol/L (ref 3.5–5.1)
Sodium: 141 mmol/L (ref 135–145)
Total Bilirubin: 0.3 mg/dL (ref 0.3–1.2)
Total Protein: 7.9 g/dL (ref 6.5–8.1)

## 2022-07-03 LAB — URINALYSIS, ROUTINE W REFLEX MICROSCOPIC
Bacteria, UA: NONE SEEN
Bilirubin Urine: NEGATIVE
Glucose, UA: NEGATIVE mg/dL
Ketones, ur: NEGATIVE mg/dL
Leukocytes,Ua: NEGATIVE
Nitrite: NEGATIVE
Protein, ur: 30 mg/dL — AB
RBC / HPF: >50 RBC/hpf — ABNORMAL HIGH (ref 0–5)
Specific Gravity, Urine: 1.032 — ABNORMAL HIGH (ref 1.005–1.030)
pH: 7.5 (ref 5.0–8.0)

## 2022-07-03 LAB — LIPASE, BLOOD: Lipase: 15 U/L (ref 11–51)

## 2022-07-03 LAB — PREGNANCY, URINE: Preg Test, Ur: NEGATIVE

## 2022-07-03 NOTE — ED Triage Notes (Signed)
Patient here POV from Home.  Endorses N/V that began at 0400 this AM. Consistent since.  No Fevers. No Diarrhea.   NAD Noted during Triage. A&OX4. GCS 15. Ambulatory.

## 2022-07-04 ENCOUNTER — Emergency Department (HOSPITAL_BASED_OUTPATIENT_CLINIC_OR_DEPARTMENT_OTHER)
Admission: EM | Admit: 2022-07-04 | Discharge: 2022-07-04 | Disposition: A | Discharge status: Home / Self Care | Payer: 59 | Attending: Emergency Medicine | Admitting: Emergency Medicine

## 2022-07-04 ENCOUNTER — Telehealth: Payer: Self-pay | Admitting: Medical

## 2022-07-04 DIAGNOSIS — K529 Noninfective gastroenteritis and colitis, unspecified: Secondary | ICD-10-CM

## 2022-07-04 MED ORDER — KETOROLAC TROMETHAMINE 30 MG/ML IJ SOLN
30.0000 mg | Freq: Once | INTRAMUSCULAR | Status: AC
  Administered 2022-07-04: 30 mg via INTRAVENOUS
  Filled 2022-07-04: qty 1

## 2022-07-04 MED ORDER — ONDANSETRON HCL 4 MG/2ML IJ SOLN
4.0000 mg | Freq: Once | INTRAMUSCULAR | Status: AC
  Administered 2022-07-04: 4 mg via INTRAVENOUS
  Filled 2022-07-04: qty 2

## 2022-07-04 MED ORDER — SODIUM CHLORIDE 0.9 % IV BOLUS
1000.0000 mL | Freq: Once | INTRAVENOUS | Status: AC
  Administered 2022-07-04: 1000 mL via INTRAVENOUS

## 2022-07-04 MED ORDER — ONDANSETRON 8 MG PO TBDP: ORAL_TABLET | ORAL | 0 refills | Status: DC

## 2022-07-04 NOTE — ED Provider Notes (Signed)
[facility] DEPT Provider Note   CSN: [id number] Arrival date & time: 07/03/22  2001     History  Chief Complaint  Patient presents with   Emesis    Kristy [name] is a 51 y.o. female.  Patient is a 51 year old female with past medical history of asthma and GERD.  Patient presenting today with complaints of vomiting.  This began at approximately 4 AM today.  She ate [employer] the night before, but no one else that she ate with became sick.  She describes multiple episodes of nausea and vomiting, but no diarrhea.  She denies abdominal pain.  She denies any fevers or chills.  She has not had anything to eat or drink since early this morning and reports having only urinated once.  The history is provided by the patient.       Home Medications Prior to Admission medications   Medication Sig Start Date End Date Taking? Authorizing Provider  albuterol (VENTOLIN HFA) 108 (90 Base) MCG/ACT inhaler Inhale 2 puffs into the lungs every 6 (six) hours as needed for wheezing or shortness of breath. Patient not taking: Reported on 06/20/2022 07/03/21   Tysinger, Camelia Eng, PA-C  albuterol (VENTOLIN HFA) 108 (90 Base) MCG/ACT inhaler Inhale 2 puffs into the lungs every 6 (six) hours as needed for wheezing or shortness of breath. 06/20/22   Tysinger, Camelia Eng, PA-C  aspirin EC 81 MG tablet Take 1 tablet (81 mg total) by mouth daily. Swallow whole. 04/25/21   Jerline Pain, MD  Azelaic Acid 15 % gel Apply topically at bedtime. 05/01/22   [provider]  cetirizine (ZYRTEC) 10 MG tablet Take 1 tablet (10 mg total) by mouth 2 (two) times daily. 02/21/21   Kennith Gain, MD  Evolocumab (REPATHA SURECLICK) 211 MG/ML SOAJ Inject 140 mg into the skin every 14 (fourteen) days. 04/26/22   Jerline Pain, MD  famotidine (PEPCID) 20 MG tablet TAKE ONE TABLET BY MOUTH AFTER SUPPER 04/15/22   Tysinger, Camelia Eng, PA-C  levocetirizine (XYZAL) 5 MG tablet  SMARTSIG:1 Tablet(s) By Mouth Every Evening Patient not taking: Reported on 06/20/2022 05/01/22   [provider]  montelukast (SINGULAIR) 10 MG tablet Take 1 tablet (10 mg total) by mouth at bedtime. 06/20/22   Tysinger, Camelia Eng, PA-C  omeprazole (PRILOSEC) 40 MG capsule TAKE 1 CAPSULE BY MOUTH EVERY DAY 12/24/21   Tysinger, Camelia Eng, PA-C      Allergies    Iodinated contrast media    Review of Systems   Review of Systems  All other systems reviewed and are negative.   Physical Exam Updated Vital Signs BP (!) 143/89 (BP Location: Right Arm)   Pulse 90   Temp 98.1 F (36.7 C) (Oral)   Resp 17   Ht '5\' 5"'$  (1.651 m)   Wt 102.1 kg   SpO2 97%   BMI 37.46 kg/m  Physical Exam Vitals and nursing note reviewed.  Constitutional:      General: She is not in acute distress.    Appearance: She is well-developed. She is not diaphoretic.  HENT:     Head: Normocephalic and atraumatic.  Cardiovascular:     Rate and Rhythm: Normal rate and regular rhythm.     Heart sounds: No murmur heard.    No friction rub. No gallop.  Pulmonary:     Effort: Pulmonary effort is normal. No respiratory distress.     Breath sounds: Normal breath sounds. No wheezing.  Abdominal:  General: Bowel sounds are normal. There is no distension.     Palpations: Abdomen is soft.     Tenderness: There is no abdominal tenderness.  Musculoskeletal:        General: Normal range of motion.     Cervical back: Normal range of motion and neck supple.  Skin:    General: Skin is warm and dry.  Neurological:     General: No focal deficit present.     Mental Status: She is alert and oriented to person, place, and time.     ED Results / Procedures / Treatments   Labs (all labs ordered are listed, but only abnormal results are displayed) Labs Reviewed  URINALYSIS, ROUTINE W REFLEX MICROSCOPIC - Abnormal; Notable for the following components:      Result Value   Specific Gravity, Urine 1.032 (*)    Hgb urine  dipstick MODERATE (*)    Protein, ur 30 (*)    RBC / HPF >50 (*)    All other components within normal limits  LIPASE, BLOOD  COMPREHENSIVE METABOLIC PANEL  CBC  PREGNANCY, URINE    EKG None  Radiology No results found.  Procedures Procedures    Medications Ordered in ED Medications  ondansetron (ZOFRAN) injection 4 mg (has no administration in time range)  ketorolac (TORADOL) 30 MG/ML injection 30 mg (has no administration in time range)  sodium chloride 0.9 % bolus 1,000 mL (has no administration in time range)    ED Course/ Medical Decision Making/ A&P  Patient presenting with complaints of vomiting as described in the HPI.  This started after eating at a restaurant the night before.  Patient arrives here with stable vital signs and is afebrile.  Physical examination is unremarkable and abdomen is benign.  She appears well-hydrated.  Workup initiated including CBC, CMP, and lipase, all of which are unremarkable.  Patient hydrated with normal saline, given Zofran and Toradol for nausea and pain.  Upon reassessment, patient is feeling better and is tolerating oral liquids.  Patient seems appropriate for discharge.  I suspect symptoms are likely either a foodborne illness or viral in nature.  Either way symptoms likely self-limited and will be treated with Zofran and clear liquids.  Final Clinical Impression(s) / ED Diagnoses Final diagnoses:  None    Rx / DC Orders ED Discharge Orders     None         Veryl Speak, MD 07/04/22 [phone]

## 2022-07-04 NOTE — Telephone Encounter (Signed)
Transition Care Management Follow-up Telephone Call Date of discharge and from where: 07/04/2022 [facility] How have you been since you were released from the hospital? Pt states that she feels better. She was given fluids in the ED and that with the fact that she is not vomiting anymore has made a big difference. Any questions or concerns? Yes Pt states that she is still unable to drink more that a sip or two of water and is feeling very tired   Items Reviewed: Did the pt receive and understand the discharge instructions provided? Yes  Medications obtained and verified? Yes  Other? Yes she states that she was to have a referral to GI I checked that and the referral was placed Any new allergies since your discharge? No  Dietary orders reviewed? Yes pt states that on AVS she did have some dietary recommendations  Do you have support at home? Yes   Home Care and Equipment/Supplies: Were home health services ordered? not applicable  PCP Hospital f/u appt confirmed?Pt declined pcp follow up  [facility] f/u appt confirmed? Referral to GI was placed but no appt has been scheduled at this time Are transportation arrangements needed? No  If their condition worsens, is the pt aware to call PCP or go to the Emergency Dept.? Yes pt was advised to return to [facility] if needed  Was the patient provided with contact information for the PCP's office or ED? Yes pt was advised of my name and office phone hours  Was to pt encouraged to call back with questions or concerns? Yes pt was advised of Holiday hours and after hour/holiday protocol

## 2022-07-04 NOTE — Discharge Instructions (Signed)
Begin taking Zofran as prescribed as needed for nausea.  Clear liquids for the next 12 hours, then slowly advance to normal as tolerated.  Return to the emergency department if you develop severe abdominal pain, high fever, bloody stool, or other new and concerning symptoms.

## 2022-07-09 ENCOUNTER — Telehealth: Payer: Self-pay | Admitting: Pharmacist

## 2022-07-09 NOTE — Telephone Encounter (Signed)
Patient called to report that she was seen in the ER the other day for abdominal pain lab work showed normal LFTs wanted [place] to be aware.  She is not seen GI yet.  I did check to make sure that referral was still active.  I encouraged her if she does not get a phone call in the next week to reach out to [place] or to reach out to [place].  Happy that her LFTs have normalized, I am sure if it was the statin or something else that caused him to be elevated.  I would like her to still to see GI before we talk about adding another agent for her LDL cholesterol.  Continue Repatha.  Patient to call us once she has been seen by GI.

## 2022-07-17 ENCOUNTER — Other Ambulatory Visit: Payer: Self-pay | Admitting: Medical

## 2022-07-17 DIAGNOSIS — R058 Other specified cough: Secondary | ICD-10-CM

## 2022-07-18 NOTE — Telephone Encounter (Signed)
  Patient called to report that she was seen in the ER the other day for abdominal pain lab work showed normal LFTs wanted [place] to be aware.  She is not seen GI yet.  I did check to make sure that referral was still active.  I encouraged her if she does not get a phone call in the next week to reach out to [place] or to reach out to [place].  Happy that her LFTs have normalized, I am sure if it was the statin or something else that caused him to be elevated.  I would like her to still to see GI before we talk about adding another agent for her LDL cholesterol.  Continue Repatha.  Patient to call us once she has been seen by GI.        Electronically signed by Ramond Dial, RPH-CPP at 07/09/2022  3:33 PM

## 2022-09-06 ENCOUNTER — Telehealth: Payer: Self-pay | Admitting: Gastroenterology

## 2022-09-06 NOTE — Telephone Encounter (Signed)
The pt has been advised that she will need to keep her appt with Dr Fuller Plan to discuss her elevated liver test from Nov.

## 2022-09-06 NOTE — Telephone Encounter (Signed)
Patient called has abnormal LFT's and is seeking advise if possible prior to  her follow up appt 10/02/22.

## 2022-09-10 ENCOUNTER — Other Ambulatory Visit: Payer: Self-pay | Admitting: Medical

## 2022-09-11 ENCOUNTER — Other Ambulatory Visit: Payer: Self-pay | Admitting: Medical

## 2022-09-11 NOTE — Telephone Encounter (Signed)
Refill request last apt 05/21/22

## 2022-10-02 ENCOUNTER — Encounter: Payer: Self-pay | Admitting: Gastroenterology

## 2022-10-02 ENCOUNTER — Ambulatory Visit: Payer: 59 | Admitting: Gastroenterology

## 2022-10-02 ENCOUNTER — Other Ambulatory Visit (INDEPENDENT_AMBULATORY_CARE_PROVIDER_SITE_OTHER): Payer: 59

## 2022-10-02 VITALS — BP 110/82 | HR 72 | Ht 65.0 in | Wt 225.0 lb

## 2022-10-02 DIAGNOSIS — R7989 Other specified abnormal findings of blood chemistry: Secondary | ICD-10-CM

## 2022-10-02 DIAGNOSIS — K5909 Other constipation: Secondary | ICD-10-CM

## 2022-10-02 DIAGNOSIS — Z86010 Personal history of colonic polyps: Secondary | ICD-10-CM

## 2022-10-02 DIAGNOSIS — K648 Other hemorrhoids: Secondary | ICD-10-CM | POA: Diagnosis not present

## 2022-10-02 DIAGNOSIS — Z860101 Personal history of adenomatous and serrated colon polyps: Secondary | ICD-10-CM

## 2022-10-02 LAB — HEPATIC FUNCTION PANEL
ALT: 16 U/L (ref 0–35)
AST: 18 U/L (ref 0–37)
Albumin: 3.5 g/dL (ref 3.5–5.2)
Alkaline Phosphatase: 58 U/L (ref 39–117)
Bilirubin, Direct: 0 mg/dL (ref 0.0–0.3)
Total Bilirubin: 0.3 mg/dL (ref 0.2–1.2)
Total Protein: 7 g/dL (ref 6.0–8.3)

## 2022-10-02 NOTE — Patient Instructions (Addendum)
Stay on senokot as needed if not working try Miralax as needed   Your provider has requested that you go to the basement level for lab work before leaving today. Press "B" on the elevator. The lab is located at the first door on the left as you exit the elevator.  _______________________________________________________  If your blood pressure at your visit was 140/90 or greater, please contact your primary care physician to follow up on this.  _______________________________________________________  If you are age 35 or older, your body mass index should be between 23-30. Your Body mass index is 37.44 kg/m. If this is out of the aforementioned range listed, please consider follow up with your Primary Care Provider.  If you are age 18 or younger, your body mass index should be between 19-25. Your Body mass index is 37.44 kg/m. If this is out of the aformentioned range listed, please consider follow up with your Primary Care Provider.   ________________________________________________________  The [facility] providers would like to encourage you to use [facility] to communicate with providers for non-urgent requests or questions.  Due to long hold times on the telephone, sending your provider a message by [facility] may be a faster and more efficient way to get a response.  Please allow 48 business hours for a response.  Please remember that this is for non-urgent requests.  _______________________________________________________

## 2022-10-02 NOTE — Progress Notes (Signed)
    Assessment     Elevated transaminases which have normalized, likely statin related Chronic constipation Personal history of adenomatous colon polyps   Recommendations    Repeat LFTs today If a statin drug is restarted recommend tracking LFTs closely Continue Senokot gummies 1-2 qd. If not effective change to Miralax 1-2 scoops qd Preparation H supp qd prn hemorrhoid symptoms Surveillance colonoscopy recommended in January 2029   HPI    This is a 52 year old female with elevated LFTs.  Elevated AST and ALT were noted up to about 3 times above normal limits in February, May, September, October and November 2023 felt secondary to rosuvastatin.  Her transaminases had normalized in June and December 2023.  She has no history of liver disease. Denies alcohol use.  She relates a long history of constipation with a bowel movement about twice per week.  She occasionally notes straining and small amounts of bright red blood per rectum with hard bowel movements. Denies weight loss, abdominal pain, diarrhea, change in stool caliber, melena, nausea, vomiting, dysphagia, reflux symptoms, chest pain.  Colonoscopy Jan 2022    Labs / Imaging       Latest Ref Rng & Units 07/03/2022    8:55 PM 05/29/2022   10:38 AM 05/01/2022   10:55 AM  Hepatic Function  Total Protein 6.5 - 8.1 g/dL 7.9  6.8  6.7   Albumin 3.5 - 5.0 g/dL 4.2  3.9  4.0   AST 15 - 41 U/L 21  37  58   ALT 0 - 44 U/L 26  60  95   Alk Phosphatase 38 - 126 U/L 81  120  114   Total Bilirubin 0.3 - 1.2 mg/dL 0.3  0.3  0.3   Bilirubin, Direct 0.00 - 0.40 mg/dL  <0.10  0.12        Latest Ref Rng & Units 07/03/2022    8:55 PM 11/27/2021    1:49 PM 08/02/2020   10:53 AM  CBC  WBC 4.0 - 10.5 K/uL 7.5  4.9  4.8   Hemoglobin 12.0 - 15.0 g/dL 13.4  12.6  12.7   Hematocrit 36.0 - 46.0 % 40.5  38.0  37.6   Platelets 150 - 400 K/uL 272  227  227.0    Current Medications, Allergies, Past Medical History, Past Surgical History,  Family History and Social History were reviewed in [employer] record.   Physical Exam: General: Well developed, well nourished, no acute distress Head: Normocephalic and atraumatic Eyes: Sclerae anicteric, EOMI Ears: Normal auditory acuity Mouth: No deformities or lesions noted Lungs: Clear throughout to auscultation Heart: Regular rate and rhythm; No murmurs, rubs or bruits Abdomen: Soft, non tender and non distended. No masses, hepatosplenomegaly or hernias noted. Normal Bowel sounds Rectal: Not done Musculoskeletal: Symmetrical with no gross deformities  Pulses:  Normal pulses noted Extremities: No edema or deformities noted Neurological: Alert oriented x 4, grossly nonfocal Psychological:  Alert and cooperative. Normal mood and affect   Hodge Stachnik T. Fuller Plan, MD 10/02/2022, 10:58 AM

## 2022-11-28 ENCOUNTER — Telehealth: Payer: Self-pay | Admitting: Pharmacist

## 2022-11-28 NOTE — Telephone Encounter (Signed)
LFTs have now normalized and patient has seen GI.  GIs recommendation was that if statin was to resume that we would monitor LFTs closely.  No indication for elevated LFTs found.  Spoke with patient reviewed that we could try resuming statin at a very low dose or we could try adding Nexletol.  Patient curious why we wanted to add another medication.  Reviewed reviewed with her that her LDL cholesterol was 124 on just Repatha which is above her goal.  She would like to recheck prior to adding another medication.  She has a physical with her PCP next week we will have her ask him to order a lipid panel and we will then decide from there next steps.

## 2022-12-03 ENCOUNTER — Encounter: Payer: Self-pay | Admitting: Medical

## 2022-12-03 ENCOUNTER — Ambulatory Visit: Payer: 59 | Admitting: Medical

## 2022-12-03 VITALS — BP 120/82 | HR 72 | Ht 64.5 in | Wt 225.4 lb

## 2022-12-03 DIAGNOSIS — R7301 Impaired fasting glucose: Secondary | ICD-10-CM

## 2022-12-03 DIAGNOSIS — R062 Wheezing: Secondary | ICD-10-CM

## 2022-12-03 DIAGNOSIS — E782 Mixed hyperlipidemia: Secondary | ICD-10-CM

## 2022-12-03 DIAGNOSIS — R0789 Other chest pain: Secondary | ICD-10-CM | POA: Diagnosis not present

## 2022-12-03 DIAGNOSIS — K219 Gastro-esophageal reflux disease without esophagitis: Secondary | ICD-10-CM | POA: Diagnosis not present

## 2022-12-03 DIAGNOSIS — I2510 Atherosclerotic heart disease of native coronary artery without angina pectoris: Secondary | ICD-10-CM

## 2022-12-03 DIAGNOSIS — R058 Other specified cough: Secondary | ICD-10-CM

## 2022-12-03 DIAGNOSIS — Z0000 Encounter for general adult medical examination without abnormal findings: Secondary | ICD-10-CM

## 2022-12-03 DIAGNOSIS — K5909 Other constipation: Secondary | ICD-10-CM

## 2022-12-03 DIAGNOSIS — J4540 Moderate persistent asthma, uncomplicated: Secondary | ICD-10-CM

## 2022-12-03 DIAGNOSIS — J4522 Mild intermittent asthma with status asthmaticus: Secondary | ICD-10-CM

## 2022-12-03 DIAGNOSIS — Z131 Encounter for screening for diabetes mellitus: Secondary | ICD-10-CM

## 2022-12-03 LAB — NMR, LIPOPROFILE

## 2022-12-03 LAB — POCT URINALYSIS DIP (PROADVANTAGE DEVICE)
Bilirubin, UA: NEGATIVE
Glucose, UA: NEGATIVE mg/dL
Ketones, POC UA: NEGATIVE mg/dL
Leukocytes, UA: NEGATIVE
Nitrite, UA: NEGATIVE
Protein Ur, POC: NEGATIVE mg/dL
Specific Gravity, Urine: 1.015
Urobilinogen, Ur: 0.2
pH, UA: 7.5 (ref 5.0–8.0)

## 2022-12-03 MED ORDER — OMEPRAZOLE 40 MG PO CPDR: 40.0000 mg | DELAYED_RELEASE_CAPSULE | Freq: Every day | ORAL | 3 refills | Status: DC

## 2022-12-03 MED ORDER — FAMOTIDINE 20 MG PO TABS: ORAL_TABLET | ORAL | 3 refills | Status: DC

## 2022-12-03 MED ORDER — MONTELUKAST SODIUM 10 MG PO TABS: ORAL_TABLET | ORAL | 3 refills | Status: DC

## 2022-12-03 MED ORDER — CETIRIZINE HCL 10 MG PO TABS: 10.0000 mg | ORAL_TABLET | Freq: Every day | ORAL | 3 refills | Status: DC

## 2022-12-03 NOTE — Progress Notes (Signed)
Subjective:   HPI  Kristy [name] is a 52 y.o. female who presents for Chief Complaint  Patient presents with   Annual Exam    Fasting annual exam no pap sees Levin Erp NP at [facility]. Has annual eye exam in [place]. No concerns today.     Patient Care Team: Rashel Okeefe, Kermit Balo, PA-C as PCP - General (Family Medicine) Copland, Chucky May as Referring Physician (Obstetrics and Gynecology) Meryl Dare, MD as Consulting Physician (Gastroenterology) Kathryne Hitch, MD as Consulting Physician (Orthopedic Surgery) Little Ishikawa, MD as Consulting Physician (Cardiology) Marcelyn Bruins, MD as Consulting Physician (Allergy) Nyoka Cowden, MD as Consulting Physician (Pulmonary Disease) Sees dentist Sees eye doctor [facility] rheumatology  Concerns: Here for routine physical.  Has history of hemorrhoids, gets occasional blood in the stool.  Has discussed with her GI doctor.  Gets occasional constipation.  No other particular new issues.  Sees rheumatology, just recently started methotrexate.  Reviewed their medical, surgical, family, social, medication, and allergy history and updated chart as appropriate.   Past Medical History:  Diagnosis Date   Allergy    BRCA negative 11/2012, 4/19   BRCA neg 2014; MyRisk update neg except POLE VUS 4/19   Dysrhythmia    a. 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 pylori infection 10/2010   H/O exercise stress test 2018   Increased risk of breast cancer 11/2012; 4/19   5/14 IBIS=25.6%; 4/19 IBIS=25.8%   Iron deficiency anemia, unspecified    history of, normal hemoglobin 06/2019   Obesity    Seasonal allergies    ragweed, tree pollen, mild and dust per testing 08/2012   Tubular adenoma of colon 2022   Unspecified vitamin D deficiency    Vitamin D deficiency  2017    Family History  Problem Relation Age of Onset   Heart disease Mother 59       stent, CAD   Breast cancer Mother 80       with recurrence about 58   Cancer Father        esophageal cancer   Heart disease Father 36       died of MI   Alcohol abuse Father    Esophageal cancer Father    Hypertension Sister    Hypertension Brother    CAD Brother        MI at age 58   Hypertension Brother    Breast cancer Maternal Grandmother 76   Diabetes Neg Hx    Stroke Neg Hx    Colon cancer Neg Hx    Colon polyps Neg Hx    Stomach cancer Neg Hx    Rectal cancer Neg Hx      Current Outpatient Medications:    aspirin EC 81 MG tablet, Take 1 tablet (81 mg total) by mouth daily. Swallow whole., Disp: 90 tablet, Rfl: 3   Evolocumab (REPATHA SURECLICK) 140 MG/ML SOAJ, Inject 140 mg into the skin every 14 (fourteen) days., Disp: 6 mL, Rfl: 3   hydroxychloroquine (PLAQUENIL) 200 MG tablet, Take 400 mg by mouth daily., Disp: , Rfl:    methotrexate (RHEUMATREX) 2.5 MG tablet, Take 10 mg by mouth once a week., Disp: , Rfl:    albuterol (VENTOLIN HFA) 108 (90 Base) MCG/ACT inhaler, Inhale 2 puffs into the lungs every 6 (six) hours  as needed for wheezing or shortness of breath. (Patient not taking: Reported on 12/03/2022), Disp: 18 g, Rfl: 1   Azelaic Acid 15 % gel, Apply topically at bedtime. (Patient not taking: Reported on 12/03/2022), Disp: , Rfl:    cetirizine (ZYRTEC) 10 MG tablet, Take 1 tablet (10 mg total) by mouth daily., Disp: 90 tablet, Rfl: 3   famotidine (PEPCID) 20 MG tablet, TAKE ONE TABLET BY MOUTH AFTER SUPPER, Disp: 90 tablet, Rfl: 3   montelukast (SINGULAIR) 10 MG tablet, TAKE 1 TABLET BY MOUTH EVERYDAY AT BEDTIME, Disp: 90 tablet, Rfl: 3   omeprazole (PRILOSEC) 40 MG capsule, Take 1 capsule (40 mg total) by mouth daily. TAKE 1 CAPSULE BY MOUTH EVERY DAY, Disp: 90 capsule, Rfl: 3  Allergies  Allergen Reactions   Iodinated Contrast Media Itching    Review of Systems   Constitutional:  Negative for chills, fever, malaise/fatigue and weight loss.  HENT:  Negative for congestion, ear pain, hearing loss, sore throat and tinnitus.   Eyes:  Negative for blurred vision, pain and redness.  Respiratory:  Negative for cough, hemoptysis and shortness of breath.   Cardiovascular:  Negative for chest pain, palpitations, orthopnea, claudication and leg swelling.  Gastrointestinal:  Positive for blood in stool. Negative for abdominal pain, constipation, diarrhea, nausea and vomiting.  Genitourinary:  Negative for dysuria, flank pain, frequency, hematuria and urgency.  Musculoskeletal:  Positive for joint pain. Negative for falls and myalgias.  Skin:  Negative for itching and rash.  Neurological:  Negative for dizziness, tingling, speech change, weakness and headaches.  Endo/Heme/Allergies:  Negative for polydipsia. Does not bruise/bleed easily.  Psychiatric/Behavioral:  Negative for depression and memory loss. The patient is not nervous/anxious and does not have insomnia.         12/03/2022   10:27 AM 11/27/2021   11:28 AM 06/12/2021   11:29 AM 10/24/2020    2:02 PM 04/03/2018    1:36 PM  Depression screen PHQ 2/9  Decreased Interest 0 0 0 0 2  Down, Depressed, Hopeless 0 0 0 0 0  PHQ - 2 Score 0 0 0 0 2  Altered sleeping     2  Tired, decreased energy     3  Change in appetite     3  Feeling bad or failure about yourself      0  Trouble concentrating     0  Moving slowly or fidgety/restless     0  Suicidal thoughts     0  PHQ-9 Score     10       Objective:  BP 120/82   Pulse 72   Ht 5' 4.5" (1.638 m)   Wt 225 lb 6.4 oz (102.2 kg)   BMI 38.09 kg/m   General appearance: alert, no distress, WD/WN, African American female Skin: unremarkable HEENT: normocephalic, conjunctiva/corneas normal, sclerae anicteric, PERRLA, EOMi, nares patent, no discharge or erythema, pharynx normal Oral cavity: MMM, tongue normal, teeth normal Neck: supple, no  lymphadenopathy, no thyromegaly, no masses, normal ROM, no bruits Chest: non tender, normal shape and expansion Heart: RRR, normal S1, S2, no murmurs Lungs: CTA bilaterally, no wheezes, rhonchi, or rales Abdomen: +bs, soft, non tender, non distended, no masses, no hepatomegaly, no splenomegaly, no bruits Back: non tender, normal ROM, no scoliosis Musculoskeletal: upper extremities non tender, no obvious deformity, normal ROM throughout, lower extremities non tender, no obvious deformity, normal ROM throughout Extremities: no edema, no cyanosis, no clubbing Pulses: 2+ symmetric, upper and lower extremities,  normal cap refill Neurological: alert, oriented x 3, CN2-12 intact, strength normal upper extremities and lower extremities, sensation normal throughout, DTRs 2+ throughout, no cerebellar signs, gait normal Psychiatric: normal affect, behavior normal, pleasant  Breast/gyn/rectal - deferred to gynecology     Assessment and Plan :   Encounter Diagnoses  Name Primary?   Routine general medical examination at a health care facility Yes   Mixed hyperlipidemia    Gastroesophageal reflux disease, unspecified whether esophagitis present    Wheezing    Tightness in chest    Upper airway cough syndrome    Screening for diabetes mellitus    Chronic constipation    Coronary artery disease involving native coronary artery of native heart without angina pectoris    Impaired fasting blood sugar    Moderate persistent reactive airway disease without complication      This visit was a preventative care visit, also known as wellness visit or routine physical.   Topics typically include healthy lifestyle, diet, exercise, preventative care, vaccinations, sick and well care, proper use of emergency dept and after hours care, as well as other concerns.     Recommendations: Continue to return yearly for your annual wellness and preventative care visits.  This gives [place] a chance to discuss healthy  lifestyle, exercise, vaccinations, review your chart record, and perform screenings where appropriate.  I recommend you see your eye doctor yearly for routine vision care.  I recommend you see your dentist yearly for routine dental care including hygiene visits twice yearly.  See your gynecologist yearly for routine gynecological care.   Vaccination recommendations were reviewed Immunization History  Administered Date(s) Administered   PFIZER(Purple Top)SARS-COV-2 Vaccination 08/26/2019, 09/20/2019, 05/25/2020   PPD Test 04/02/2016   Td 11/27/2021   Recommended shingles and pneumococcal vaccine.  She declines   Screening for cancer: Colon cancer screening: I reviewed your colonoscopy on file that is up to date from 2022.  Due repeat in 2027.  Breast cancer screening: You should perform a self breast exam monthly.   We reviewed recommendations for regular mammograms and breast cancer screening.  Cervical cancer screening: We reviewed recommendations for pap smear screening.  Pap reviewed, up to date   Skin cancer screening: Check your skin regularly for new changes, growing lesions, or other lesions of concern Come in for evaluation if you have skin lesions of concern.  Lung cancer screening: If you have a greater than 20 pack year history of tobacco use, then you may qualify for lung cancer screening with a chest CT scan.   Please call your insurance company to inquire about coverage for this test.  We currently don't have screenings for other cancers besides breast, cervical, colon, and lung cancers.  If you have a strong family history of cancer or have other cancer screening concerns, please let me know.    Bone health: Get at least 150 minutes of aerobic exercise weekly Get weight bearing exercise at least once weekly Bone density test:  A bone density test is an imaging test that uses a type of X-ray to measure the amount of calcium and other minerals in your  bones. The test may be used to diagnose or screen you for a condition that causes weak or thin bones (osteoporosis), predict your risk for a broken bone (fracture), or determine how well your osteoporosis treatment is working. The bone density test is recommended for females 65 and older, or females or males <65 if certain risk factors such as thyroid  disease, long term use of steroids such as for asthma or rheumatological issues, vitamin D deficiency, estrogen deficiency, family history of osteoporosis, self or family history of fragility fracture in first degree relative.    Heart health: Get at least 150 minutes of aerobic exercise weekly Limit alcohol It is important to maintain a healthy blood pressure and healthy cholesterol numbers  Heart disease screening: Screening for heart disease includes screening for blood pressure, fasting lipids, glucose/diabetes screening, BMI height to weight ratio, reviewed of smoking status, physical activity, and diet.    Goals include blood pressure 120/80 or less, maintaining a healthy lipid/cholesterol profile, preventing diabetes or keeping diabetes numbers under good control, not smoking or using tobacco products, exercising most days per week or at least 150 minutes per week of exercise, and eating healthy variety of fruits and vegetables, healthy oils, and avoiding unhealthy food choices like fried food, fast food, high sugar and high cholesterol foods.    Continue routine follow up with cardiology   Medical care options: I recommend you continue to seek care here first for routine care.  We try really hard to have available appointments Monday through Friday daytime hours for sick visits, acute visits, and physicals.  Urgent care should be used for after hours and weekends for significant issues that cannot wait till the next day.  The emergency department should be used for significant potentially life-threatening emergencies.  The emergency  department is expensive, can often have long wait times for less significant concerns, so try to utilize primary care, urgent care, or telemedicine when possible to avoid unnecessary trips to the emergency department.  Virtual visits and telemedicine have been introduced since the pandemic started in 2020, and can be convenient ways to receive medical care.  We offer virtual appointments as well to assist you in a variety of options to seek medical care.    Separate significant issues discussed: Reactive airway disease-no recent concerns  Coronary artery disease, hyperlipidemia- currently on repatha.  Has been on zetia and statin prior, but had elevated LFTs/inflammation.    Impaired fasting glucose-updated lab today  Rheumatoid disease - follow with rheumatology as planned, ask about folic acid on next visit.  We will request recent visit notes and labs.   CAD - continue repatha   Renelda was seen today for annual exam.  Diagnoses and all orders for this visit:  Routine general medical examination at a health care facility -     TSH -     POCT Urinalysis DIP (Proadvantage Device) -     NMR, lipoprofile -     Hemoglobin A1c  Mixed hyperlipidemia -     NMR, lipoprofile  Gastroesophageal reflux disease, unspecified whether esophagitis present -     famotidine (PEPCID) 20 MG tablet; TAKE ONE TABLET BY MOUTH AFTER SUPPER -     omeprazole (PRILOSEC) 40 MG capsule; Take 1 capsule (40 mg total) by mouth daily. TAKE 1 CAPSULE BY MOUTH EVERY DAY  Wheezing  Tightness in chest  Upper airway cough syndrome -     famotidine (PEPCID) 20 MG tablet; TAKE ONE TABLET BY MOUTH AFTER SUPPER  Screening for diabetes mellitus -     Hemoglobin A1c  Chronic constipation  Coronary artery disease involving native coronary artery of native heart without angina pectoris  Impaired fasting blood sugar  Moderate persistent reactive airway disease without complication  Other orders -      montelukast (SINGULAIR) 10 MG tablet; TAKE 1 TABLET BY MOUTH EVERYDAY  AT BEDTIME -     cetirizine (ZYRTEC) 10 MG tablet; Take 1 tablet (10 mg total) by mouth daily.   Follow-up pending labs, yearly for physical

## 2022-12-04 LAB — NMR, LIPOPROFILE
Cholesterol, Total: 190 mg/dL (ref 100–199)
HDL Particle Number: 34.7 umol/L (ref >=30.5)
HDL-C: 75 mg/dL (ref >39)
LDL-C (NIH Calc): 104 mg/dL — ABNORMAL HIGH (ref 0–99)
LP-IR Score: <25 (ref <=45)

## 2022-12-04 LAB — HEMOGLOBIN A1C
Est. average glucose Bld gHb Est-mCnc: 114 mg/dL
Hgb A1c MFr Bld: 5.6 % (ref 4.8–5.6)

## 2022-12-04 LAB — TSH: TSH: 1.49 u[IU]/mL (ref 0.450–4.500)

## 2022-12-04 NOTE — Progress Notes (Signed)
Results sent through MyChart

## 2022-12-04 NOTE — Progress Notes (Signed)
I am forwarding results to cardiology and pharmacy.   Kristy [name] -have continue her current therapy

## 2022-12-05 ENCOUNTER — Encounter: Payer: Self-pay | Admitting: Pharmacist

## 2023-01-15 ENCOUNTER — Ambulatory Visit (INDEPENDENT_AMBULATORY_CARE_PROVIDER_SITE_OTHER): Payer: 59

## 2023-01-15 ENCOUNTER — Ambulatory Visit
Admission: RE | Admit: 2023-01-15 | Discharge: 2023-01-15 | Disposition: A | Discharge status: Home / Self Care | Payer: 59 | Source: Ambulatory Visit | Attending: Internal Medicine | Admitting: Internal Medicine

## 2023-01-15 VITALS — BP 133/85 | HR 88 | Temp 97.7°F | Resp 18

## 2023-01-15 DIAGNOSIS — R0789 Other chest pain: Secondary | ICD-10-CM

## 2023-01-15 DIAGNOSIS — J209 Acute bronchitis, unspecified: Secondary | ICD-10-CM | POA: Diagnosis not present

## 2023-01-15 DIAGNOSIS — R062 Wheezing: Secondary | ICD-10-CM

## 2023-01-15 DIAGNOSIS — R051 Acute cough: Secondary | ICD-10-CM | POA: Diagnosis not present

## 2023-01-15 MED ORDER — DOXYCYCLINE HYCLATE 100 MG PO CAPS: 100.0000 mg | ORAL_CAPSULE | Freq: Two times a day (BID) | ORAL | 0 refills | Status: AC

## 2023-01-15 MED ORDER — ALBUTEROL SULFATE HFA 108 (90 BASE) MCG/ACT IN AERS
2.0000 | INHALATION_SPRAY | Freq: Four times a day (QID) | RESPIRATORY_TRACT | 1 refills | Status: DC | PRN
Start: 2023-01-15 — End: 2024-03-02

## 2023-01-15 MED ORDER — METHYLPREDNISOLONE ACETATE 80 MG/ML IJ SUSP
40.0000 mg | Freq: Once | INTRAMUSCULAR | Status: AC
  Administered 2023-01-15: 40 mg via INTRAMUSCULAR

## 2023-01-15 MED ORDER — ALBUTEROL SULFATE (2.5 MG/3ML) 0.083% IN NEBU
2.5000 mg | INHALATION_SOLUTION | Freq: Once | RESPIRATORY_TRACT | Status: AC
  Administered 2023-01-15: 2.5 mg via RESPIRATORY_TRACT

## 2023-01-15 MED ORDER — METHYLPREDNISOLONE 4 MG PO TBPK: ORAL_TABLET | ORAL | 0 refills | Status: DC

## 2023-01-15 NOTE — ED Provider Notes (Signed)
[facility]  Note:  This document was prepared using Dragon voice recognition software and may include unintentional dictation errors.  MRN: [id number] DOB: [birth year] DATE: 01/15/23   Subjective:  Chief Complaint:  Chief Complaint  Patient presents with   Wheezing    Congestion - Entered by patient     HPI: Kristy [name] is a 52 y.o. female presenting for wheezing and dry cough for the past 5 days.  Patient states she first developed a cough with chest congestion.  She took over-the-counter Mucinex to help break up the congestion, but reports no relief.  She became concerned when she noticed she started wheezing more.  She states the cough and wheezing has been keeping her up at night.  She does have an albuterol inhaler and states she used it this morning.  No known sick contacts.  She states in the past she has occasional wheezing, but this has become more persistent.  She did have some slight shortness of breath.  She states her symptoms are worse at night when she lays down.  Denies fever, sore throat, nasal congestion. Endorses dry cough, wheezing. Presents NAD.  Prior to Admission medications   Medication Sig Start Date End Date Taking? Authorizing Provider  albuterol (VENTOLIN HFA) 108 (90 Base) MCG/ACT inhaler Inhale 2 puffs into the lungs every 6 (six) hours as needed for wheezing or shortness of breath. Patient not taking: Reported on 12/03/2022 07/03/21   Tysinger, Kermit Balo, PA-C  aspirin EC 81 MG tablet Take 1 tablet (81 mg total) by mouth daily. Swallow whole. 04/25/21   Jake Bathe, MD  Azelaic Acid 15 % gel Apply topically at bedtime. Patient not taking: Reported on 12/03/2022 05/01/22   [provider]  cetirizine (ZYRTEC) 10 MG tablet Take 1 tablet (10 mg total) by mouth daily. 12/03/22   Tysinger, Kermit Balo, PA-C  Evolocumab (REPATHA SURECLICK) 140 MG/ML SOAJ Inject 140 mg into the skin every 14 (fourteen) days. 04/26/22   Jake Bathe, MD   famotidine (PEPCID) 20 MG tablet TAKE ONE TABLET BY MOUTH AFTER SUPPER 12/03/22   Tysinger, Kermit Balo, PA-C  hydroxychloroquine (PLAQUENIL) 200 MG tablet Take 400 mg by mouth daily. 07/25/22   [provider]  methotrexate (RHEUMATREX) 2.5 MG tablet Take 10 mg by mouth once a week. 10/30/22   [provider]  montelukast (SINGULAIR) 10 MG tablet TAKE 1 TABLET BY MOUTH EVERYDAY AT BEDTIME 12/03/22   Tysinger, Kermit Balo, PA-C  omeprazole (PRILOSEC) 40 MG capsule Take 1 capsule (40 mg total) by mouth daily. TAKE 1 CAPSULE BY MOUTH EVERY DAY 12/03/22   Tysinger, Kermit Balo, PA-C     Allergies  Allergen Reactions   Iodinated Contrast Media Itching    History:   Past Medical History:  Diagnosis Date   Allergy    BRCA negative 11/2012, 4/19   BRCA neg 2014; MyRisk update neg except POLE VUS 4/19   Dysrhythmia    a. 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 pylori infection 10/2010   H/O exercise stress test 2018   Increased risk of breast cancer 11/2012; 4/19   5/14 IBIS=25.6%; 4/19 IBIS=25.8%   Iron deficiency anemia, unspecified    history of, normal hemoglobin 06/2019   Obesity    Seasonal allergies    ragweed, tree pollen, mild and dust per testing 08/2012  Tubular adenoma of colon 2022   Unspecified vitamin D deficiency    Vitamin D deficiency 2017     Past Surgical History:  Procedure Laterality Date   BREAST BIOPSY Right 1980s   neg   CESAREAN SECTION     DILITATION & CURRETTAGE/HYSTROSCOPY WITH NOVASURE ABLATION N/A 09/14/2015   Procedure: DILATATION & CURETTAGE/HYSTEROSCOPY WITH NOVASURE ABLATION;  Surgeon: Nadara Mustard, MD;  Location: [facility];  Service: Gynecology;  Laterality: N/A;   IUD REMOVAL N/A 09/14/2015   Procedure: INTRAUTERINE DEVICE (IUD) REMOVAL;  Surgeon: Nadara Mustard, MD;  Location: [facility];  Service: Gynecology;  Laterality: N/A;    LAPAROSCOPIC BILATERAL SALPINGECTOMY Bilateral 09/14/2015   Procedure: LAPAROSCOPIC BILATERAL SALPINGECTOMY;  Surgeon: Nadara Mustard, MD;  Location: [facility];  Service: Gynecology;  Laterality: Bilateral;   TUBAL LIGATION Bilateral 09/14/2015   Procedure: BILATERAL TUBAL LIGATION;  Surgeon: Nadara Mustard, MD;  Location: [facility];  Service: Gynecology;  Laterality: Bilateral;   VULVAR LESION REMOVAL N/A 09/14/2015   Procedure: VULVAR LESION;  Surgeon: Nadara Mustard, MD;  Location: [facility];  Service: Gynecology;  Laterality: N/A;   WISDOM TOOTH EXTRACTION      Family History  Problem Relation Age of Onset   Heart disease Mother 44       stent, CAD   Breast cancer Mother 57       with recurrence about 42   Cancer Father        esophageal cancer   Heart disease Father 60       died of MI   Alcohol abuse Father    Esophageal cancer Father    Hypertension Sister    Hypertension Brother    CAD Brother        MI at age 49   Hypertension Brother    Breast cancer Maternal Grandmother 7   Diabetes Neg Hx    Stroke Neg Hx    Colon cancer Neg Hx    Colon polyps Neg Hx    Stomach cancer Neg Hx    Rectal cancer Neg Hx     Social History   Tobacco Use   Smoking status: Never   Smokeless tobacco: Never  Vaping Use   Vaping Use: Never used  Substance Use Topics   Alcohol use: No    Comment: maybe one drink a year   Drug use: No    Review of Systems  Constitutional:  Positive for fatigue. Negative for fever.  HENT:  Positive for congestion. Negative for ear pain, rhinorrhea, sinus pressure and sore throat.   Respiratory:  Positive for cough.   Gastrointestinal:  Negative for nausea and vomiting.     Objective:   Vitals: BP 133/85 (BP Location: Right Arm)   Pulse 88   Temp 97.7 F (36.5 C) (Oral)   Resp 18   SpO2 93%   Physical Exam Constitutional:      General: She is not in acute distress.    Appearance: Normal appearance. She is well-developed. She is obese.  She is not ill-appearing or toxic-appearing.  HENT:     Head: Normocephalic and atraumatic.  Cardiovascular:     Rate and Rhythm: Normal rate and regular rhythm.     Heart sounds: Normal heart sounds.  Pulmonary:     Effort: Pulmonary effort is normal.     Breath sounds: Normal breath sounds. Decreased air movement present.     Comments: Clear to auscultation bilaterally  Abdominal:     General:  Bowel sounds are normal.     Palpations: Abdomen is soft.     Tenderness: There is no abdominal tenderness.  Skin:    General: Skin is warm and dry.  Neurological:     General: No focal deficit present.     Mental Status: She is alert.  Psychiatric:        Mood and Affect: Mood and affect normal.     Results:  Labs: No results found for this or any previous visit (from the past 24 hour(s)).  Radiology: DG Chest 2 View  Result Date: 01/15/2023 CLINICAL DATA:  Wheezing x 5 days, waking up at night, slight cough, EXAM: CHEST - 2 VIEW COMPARISON:  Chest radiograph 07/28/2019 FINDINGS: The heart size and mediastinal contours are within normal limits. Diffuse bilateral coarsening of the interstitium. No focal consolidation. No pneumothorax or pleural effusion. No acute finding in the visualized skeleton. IMPRESSION: Diffuse bilateral coarsening of the interstitium which may represent bronchiolitis or reactive airways disease. No focal consolidation. Electronically Signed   By: Emmaline Kluver M.D.   On: 01/15/2023 08:55     UC Course/Treatments:  Procedures: Procedures   Medications Ordered in UC: Medications  albuterol (PROVENTIL) (2.5 MG/3ML) 0.083% nebulizer solution 2.5 mg (2.5 mg Nebulization Given 01/15/23 0822)  methylPREDNISolone acetate (DEPO-MEDROL) injection 40 mg (40 mg Intramuscular Given 01/15/23 0915)     Assessment and Plan :     ICD-10-CM   1. Acute bronchitis, unspecified organism  J20.9     2. Acute cough  R05.1     3. Wheezing  R06.2      Acute bronchitis,  unspecified organism Afebrile, nontoxic-appearing, NAD. VSS. DDX includes but not limited to: COVID, flu, pneumonia, bronchitis Flu and COVID not ordered at this time given length of symptoms.  No wheezing noted on exam today, but decreased air movement.  Albuterol nebulizer was given in office and patient reports slight improvement.  Imaging concerning for bronchiolitis versus reactive airway disease. Given worsening of wheezing that is keeping her up at night as well as her imaging, methylprednisolone 40 mg IM was given today in office as well as a Medrol pack was prescribed. She was also given a prescription for Doxycycline 100mg  BID and her albuterol inhaler was refilled. If no improvement, recommend follow-up with PCP. Strict ED precautions were given and patient verbalized understanding.  Acute cough See Notes Above  Wheezing See Notes Above    ED Discharge Orders          Ordered    albuterol (VENTOLIN HFA) 108 (90 Base) MCG/ACT inhaler  Every 6 hours PRN        01/15/23 0821    doxycycline (VIBRAMYCIN) 100 MG capsule  2 times daily        01/15/23 0909    methylPREDNISolone (MEDROL DOSEPAK) 4 MG TBPK tablet        01/15/23 0909             PDMP not reviewed this encounter.     Cynda Acres, PA-C 01/15/23 [id number]

## 2023-01-15 NOTE — Discharge Instructions (Signed)
A prescription was sent for Doxycycline. This is an antibiotic used to treat upper respiratory symptoms. Take as directed.   I have also refilled your inhaler and sent you a steroid pack to help with the wheezing and cough. Start the steroids tomorrow given your injection today.  You were given an injection (methylprednisolone) for your URI today. This will help with the cough and wheezing  Return in 2 to 3 days if no improvement. It is very important for you to pay attention to any new symptoms or worsening of your current condition. Please go directly to the Emergency Department immediately should you begin to have any of the following symptoms: shortness of breath, chest pain or difficulty breathing.

## 2023-01-15 NOTE — ED Triage Notes (Signed)
Wheezing x 5 days, waking up at night, slight cough,  Home remedies: Mucinex Denies F, chills

## 2023-01-16 ENCOUNTER — Telehealth: Payer: Self-pay

## 2023-01-16 ENCOUNTER — Telehealth: Payer: Self-pay | Admitting: Internal Medicine

## 2023-01-16 MED ORDER — CEFDINIR 300 MG PO CAPS: 300.0000 mg | ORAL_CAPSULE | Freq: Two times a day (BID) | ORAL | 0 refills | Status: AC

## 2023-01-16 NOTE — Telephone Encounter (Signed)
ATC pt x 1 attempt, left VM. To adv per [employer], PA "She can stop the doxycycline. I will send her cefdinir. I prefer the doxycycline, but if not able to tolerate will change. please verify no allergies."

## 2023-01-16 NOTE — Telephone Encounter (Signed)
RC from pt, ID verified. Advised per [employer], PA. She can stop the doxycycline. I will send her cefdinir. I prefer the doxycycline, but if not able to tolerate will change. Verified no allergies.

## 2023-01-16 NOTE — Telephone Encounter (Signed)
Patient instructed to stop doxycycline and RX sent for cefdinir 300mg  BID x 10 days.

## 2023-01-22 ENCOUNTER — Other Ambulatory Visit (HOSPITAL_COMMUNITY): Payer: Self-pay

## 2023-01-24 ENCOUNTER — Ambulatory Visit
Admission: RE | Admit: 2023-01-24 | Discharge: 2023-01-24 | Disposition: A | Discharge status: Home / Self Care | Payer: 59 | Source: Ambulatory Visit

## 2023-01-24 ENCOUNTER — Other Ambulatory Visit: Payer: Self-pay

## 2023-01-24 VITALS — BP 131/88 | HR 87 | Temp 97.9°F | Resp 18 | Ht 65.0 in | Wt 220.0 lb

## 2023-01-24 DIAGNOSIS — J209 Acute bronchitis, unspecified: Secondary | ICD-10-CM

## 2023-01-24 MED ORDER — PREDNISONE 20 MG PO TABS: 20.0000 mg | ORAL_TABLET | Freq: Every day | ORAL | 0 refills | Status: DC

## 2023-01-24 MED ORDER — PROMETHAZINE-DM 6.25-15 MG/5ML PO SYRP: 5.0000 mL | ORAL_SOLUTION | Freq: Three times a day (TID) | ORAL | 0 refills | Status: DC | PRN

## 2023-01-24 NOTE — Discharge Instructions (Addendum)
Continue Cefdinir. I am adding a low dose of prednisone 20 mg daily for 5 days.  As discussed you can start taking your famotidine with dinner to prevent interaction with your nighttime dose of Plaquenil.  Your cough should gradually improve along with the wheezing.  As discussed use your albuterol inhaler 1 hour prior to lying down to prevent wheezing and shortness of breath.  Any of your symptoms do not improve with this course of treatment follow-up with your primary care doctor return for evaluation.

## 2023-01-24 NOTE — ED Provider Notes (Signed)
[facility]    CSN: [id number] Arrival date & time: 01/24/23  1441      History   Chief Complaint Chief Complaint  Patient presents with   Cough    Entered by patient    HPI Kristy [name] is a 52 y.o. female.   HPI Patient with history of RA and GERD currently prescribed immunosuppressant therapy for treatment of RA presents today with a worsening of cough and wheezing.  Patient seen at [facility] urgent care in [facility] on 01/15/2023 and diagnosed with bronchitis.  Patient was initially prescribed doxycycline however medication interacted with her Plaquenil and the antibiotic was changed to cefdinir.  She was also prescribed a 5-day course of prednisone which she completed but reports that her cough has subsequently worsened since her visit on 01/15/2023.  She was not prescribed anything for cough.  She continues to complain of wheezing.  She has not had fever and denies any worsening of any other symptoms.  Has no prior history of asthma, reactive airway disease, or seasonal allergies.  History reviewed. No pertinent past medical history.  There are no problems to display for this patient.   History reviewed. No pertinent surgical history.  OB History   No obstetric history on file.      Home Medications    Prior to Admission medications   Medication Sig Start Date End Date Taking? Authorizing Provider  albuterol (VENTOLIN HFA) 108 (90 Base) MCG/ACT inhaler Inhale 2 puffs into the lungs every 4 (four) hours as needed for wheezing or shortness of breath.   Yes [provider]  Evolocumab (REPATHA SURECLICK) 140 MG/ML SOAJ Inject 140 mg as directed as directed.  INJECT 140 MG INTO THE SKIN EVERY 14 (FOURTEEN) DAYS. Subcutaneous for 89 Days 04/29/22  Yes [provider]  famotidine (PEPCID) 20 MG tablet Take 20 mg by mouth daily.   Yes [provider]  Fluocinolone Acetonide Scalp 0.01 % OIL Apply 1 Application topically as directed. APPLY  DIRECTLY TO SCALP DAILY WHEN ITCHING THE 1-2 TIMES WEEKLY FOR MAINTENANCE 10/30/21  Yes [provider]  hydroxychloroquine (PLAQUENIL) 200 MG tablet Take 400 mg by mouth daily. 07/25/22  Yes [provider]  ketoconazole (NIZORAL) 2 % shampoo Apply 1 Application topically 2 (two) times a week. 10/30/21  Yes [provider]  methotrexate (RHEUMATREX) 2.5 MG tablet Take 2.5 mg by mouth as directed. Take 4 tabs once a week for 1 month, get labs, if ok, then increase to 6 tabs once a week for 1 month, get labs, if ok, then continue with 6 tabs once a week. Orally weekly for 90 days 10/30/22  Yes [provider]  montelukast (SINGULAIR) 10 MG tablet Take 10 mg by mouth at bedtime.   Yes [provider]  omeprazole (PRILOSEC) 40 MG capsule Take 40 mg by mouth daily. 10/01/21  Yes [provider]  predniSONE (DELTASONE) 20 MG tablet Take 1 tablet (20 mg total) by mouth daily with breakfast for 5 days. 01/24/23 01/29/23 Yes Bing Neighbors, NP  promethazine-dextromethorphan (PROMETHAZINE-DM) 6.25-15 MG/5ML syrup Take 5 mLs by mouth 3 (three) times daily as needed for cough. 01/24/23  Yes Bing Neighbors, NP  Azelaic Acid 15 % gel Apply 1 Application topically 2 (two) times daily. 05/01/22   [provider]  folic acid (FOLVITE) 1 MG tablet Take 1 mg by mouth daily. 10/30/22   [provider]  hydrOXYzine (ATARAX) 10 MG tablet Take 10 mg by mouth as  directed. TAKE 1-3 TABLETS BY MOUTH AT BEDTIME AS NEEDED FOR ITCHING (DROWSINESS WARNING) Oral for 30 Days    [provider]  ketoconazole (NIZORAL) 2 % cream Apply 1 Application topically 2 (two) times daily as needed for irritation.    [provider]  sulconazole nitrate 1 % external solution Apply 1 Application topically daily. 10/30/21   [provider]  triamcinolone ointment (KENALOG) 0.1 % Apply 1 Application topically 2 (two) times daily. 10/30/21   [provider]    Family History History reviewed. No pertinent family history.  Social History Social History   Tobacco Use   Smoking status: Never   Smokeless tobacco: Never  Vaping Use   Vaping status: Never Used  Substance Use Topics   Alcohol use: Not Currently   Drug use: Never     Allergies   Iodinated contrast media   Review of Systems Review of Systems Pertinent negatives listed in HPI  Physical Exam Triage Vital Signs ED Triage Vitals [01/24/23 1456]  Encounter Vitals Group     BP      Systolic BP Percentile      Diastolic BP Percentile      Pulse      Resp      Temp      Temp src      SpO2      Weight 220 lb (99.8 kg)     Height 5\' 5"  (1.651 m)     Head Circumference      Peak Flow      Pain Score 0     Pain Loc      Pain Education      Exclude from Growth Chart    No data found.  Updated Vital Signs BP 131/88 (BP Location: Left Arm)   Pulse 87   Temp 97.9 F (36.6 C) (Oral)   Resp 18   Ht 5\' 5"  (1.651 m)   Wt 220 lb (99.8 kg)   LMP  (LMP Unknown)   SpO2 96%   BMI 36.61 kg/m   Visual Acuity Right Eye Distance:   Left Eye Distance:   Bilateral Distance:    Right Eye Near:   Left Eye Near:    Bilateral Near:     Physical Exam Vitals reviewed.  Constitutional:      Appearance: Normal appearance.  Eyes:     Extraocular Movements: Extraocular movements intact.     Pupils: Pupils are equal, round, and reactive to light.  Cardiovascular:     Rate and Rhythm: Normal rate and regular rhythm.  Pulmonary:     Effort: Pulmonary effort is normal. No tachypnea.     Breath sounds: Decreased air movement present. Rhonchi present.  Musculoskeletal:     Cervical back: Normal range of motion and neck supple.  Skin:    General: Skin is warm and dry.  Neurological:     General: No focal deficit present.     Mental Status: She is alert and oriented to person, place, and time.      UC Treatments / Results  Labs (all labs ordered are  listed, but only abnormal results are displayed) Labs Reviewed - No data to display  EKG   Radiology No results found.  Procedures Procedures (including critical care time)  Medications Ordered in UC Medications - No data to display  Initial Impression / Assessment and Plan / UC Course  I have reviewed the triage vital signs and the nursing notes.  Pertinent  labs & imaging results that were available during my care of the patient were reviewed by me and considered in my medical decision making (see chart for details).    Patient on exam has some decreased air movement complains of worsening wheezing with cough usually at nighttime. Extending prednisone 20 mg daily x 5 days. Promethazine DM 5 mL 3 times daily as needed for cough. Advised patient to complete Cefdinir.  And that prednisone can worsen acid reflux symptoms encouraged patient to take the famotidine earlier in the day prior to dinner and continue taking her Plaquenil at bedtime prescribed.  Strict return precautions given if any of her symptoms worsen to either follow-up with PCP or return for evaluation. Final Clinical Impressions(s) / UC Diagnoses   Final diagnoses:  Acute bronchitis, unspecified organism     Discharge Instructions      Continue Cefdinir. I am adding a low dose of prednisone 20 mg daily for 5 days.  As discussed you can start taking your famotidine with dinner to prevent interaction with your nighttime dose of Plaquenil.  Your cough should gradually improve along with the wheezing.  As discussed use your albuterol inhaler 1 hour prior to lying down to prevent wheezing and shortness of breath.  Any of your symptoms do not improve with this course of treatment follow-up with your primary care doctor return for evaluation.     ED Prescriptions     Medication Sig Dispense Auth. Provider   predniSONE (DELTASONE) 20 MG tablet Take 1 tablet (20 mg total) by mouth daily with breakfast for 5 days. 5  tablet Bing Neighbors, NP   promethazine-dextromethorphan (PROMETHAZINE-DM) 6.25-15 MG/5ML syrup Take 5 mLs by mouth 3 (three) times daily as needed for cough. 240 mL Bing Neighbors, NP      PDMP not reviewed this encounter.   Bing Neighbors, NP 01/24/23 1549

## 2023-01-24 NOTE — ED Triage Notes (Signed)
"  I have been dealing with congestion that has turned into a Cough". Worse at night. "I called UC that I went to recently and they said I needed to be seen again since Cough is no better". No fever. ? Sob at times.

## 2023-01-29 ENCOUNTER — Ambulatory Visit: Payer: 59 | Admitting: Medical

## 2023-01-29 VITALS — BP 104/64 | HR 81 | Temp 97.6°F | Resp 16 | Wt 229.4 lb

## 2023-01-29 DIAGNOSIS — M069 Rheumatoid arthritis, unspecified: Secondary | ICD-10-CM

## 2023-01-29 DIAGNOSIS — R052 Subacute cough: Secondary | ICD-10-CM

## 2023-01-29 DIAGNOSIS — K219 Gastro-esophageal reflux disease without esophagitis: Secondary | ICD-10-CM | POA: Diagnosis not present

## 2023-01-29 DIAGNOSIS — R062 Wheezing: Secondary | ICD-10-CM

## 2023-01-29 MED ORDER — FLUTICASONE-SALMETEROL 100-50 MCG/ACT IN AEPB: 1.0000 | INHALATION_SPRAY | Freq: Two times a day (BID) | RESPIRATORY_TRACT | 1 refills | Status: DC

## 2023-01-29 NOTE — Progress Notes (Signed)
Subjective:  Kristy [name] is a 52 y.o. female who presents for Chief Complaint  Patient presents with   Follow-up    Follow-up from UC, still having wheezing, went to UC twice. Was given prednisone but she is still having a lot of wheezing     She notes some problems with wheezing and SOB.  Has seen urgent care 2 Fridays in a row.  Was given breathing treatment and sent home on steroids and antibiotic initially, and this past Friday 5 more days of prednisone and cough medication.  Currently using albuterol 2-3 times daily  Currently she notes some ongoing wheezing, feels congestion in middle of chest, and seems like it wont come out.     No fever, no NVD, some productive cough with yellow sputum.     Nonsmoker, no hx/o asthma, but has had to get breathing treatments in recent years once yearly related to GERD  She continues on PPI and famotidine for long-term GERD issues and belching.  She is curious about H. pylori testing  Sees Dr. Ambrose Finland with [facility]  No other aggravating or relieving factors.    No other c/o.  Past Medical History:  Diagnosis Date   Allergy    BRCA negative 11/2012, 4/19   BRCA neg 2014; MyRisk update neg except POLE VUS 4/19   Dysrhythmia    a. 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 pylori infection 10/2010   H/O exercise stress test 2018   Increased risk of breast cancer 11/2012; 4/19   5/14 IBIS=25.6%; 4/19 IBIS=25.8%   Iron deficiency anemia, unspecified    history of, normal hemoglobin 06/2019   Obesity    Seasonal allergies    ragweed, tree pollen, mild and dust per testing 08/2012   Tubular adenoma of colon 2022   Unspecified vitamin D deficiency    Vitamin D deficiency 2017   Current Outpatient Medications on File Prior to Visit  Medication Sig Dispense Refill   albuterol (VENTOLIN HFA) 108 (90  Base) MCG/ACT inhaler Inhale 2 puffs into the lungs every 6 (six) hours as needed for wheezing or shortness of breath. 18 g 1   aspirin EC 81 MG tablet Take 1 tablet (81 mg total) by mouth daily. Swallow whole. 90 tablet 3   Azelaic Acid 15 % gel Apply topically at bedtime.     cetirizine (ZYRTEC) 10 MG tablet Take 1 tablet (10 mg total) by mouth daily. 90 tablet 3   Evolocumab (REPATHA SURECLICK) 140 MG/ML SOAJ Inject 140 mg into the skin every 14 (fourteen) days. 6 mL 3   famotidine (PEPCID) 20 MG tablet TAKE ONE TABLET BY MOUTH AFTER SUPPER 90 tablet 3   Fluocinolone Acetonide Scalp 0.01 % OIL Apply 1 Application topically as directed. APPLY DIRECTLY TO SCALP DAILY WHEN ITCHING THE 1-2 TIMES WEEKLY FOR MAINTENANCE     folic acid (FOLVITE) 1 MG tablet Take 1 mg by mouth daily.     hydroxychloroquine (PLAQUENIL) 200 MG tablet Take 400 mg by mouth daily.     ketoconazole (NIZORAL) 2 % cream Apply 1 Application topically 2 (two) times daily as needed for irritation.     ketoconazole (NIZORAL) 2 % shampoo Apply 1 Application topically 2 (two) times a week.     montelukast (SINGULAIR) 10 MG tablet TAKE 1 TABLET BY MOUTH EVERYDAY AT BEDTIME 90 tablet 3  omeprazole (PRILOSEC) 40 MG capsule Take 1 capsule (40 mg total) by mouth daily. TAKE 1 CAPSULE BY MOUTH EVERY DAY 90 capsule 3   sulconazole nitrate 1 % external solution Apply 1 Application topically daily.     triamcinolone ointment (KENALOG) 0.1 % Apply 1 Application topically 2 (two) times daily.     Azelaic Acid 15 % gel Apply 1 Application topically 2 (two) times daily.     methotrexate (RHEUMATREX) 2.5 MG tablet Take 10 mg by mouth once a week.     promethazine-dextromethorphan (PROMETHAZINE-DM) 6.25-15 MG/5ML syrup Take 5 mLs by mouth 3 (three) times daily as needed for cough. (Patient not taking: Reported on 01/29/2023) 240 mL 0   No current facility-administered medications on file prior to visit.     The following portions of the  patient's history were reviewed and updated as appropriate: allergies, current medications, past family history, past medical history, past social history, past surgical history and problem list.  ROS Otherwise as in subjective above  Objective: BP 104/64   Pulse 81   Temp 97.6 F (36.4 C)   Resp 16   Wt 229 lb 6.4 oz (104.1 kg)   LMP  (LMP Unknown)   SpO2 97%   BMI 38.17 kg/m   General appearance: alert, no distress, well developed, well nourished HEENT: normocephalic, sclerae anicteric, conjunctiva pink and moist, TMs pearly, nares with mild clear discharge, mild erythema, pharynx normal Oral cavity: MMM, no lesions Neck: supple, no lymphadenopathy, no thyromegaly, no masses Heart: RRR, normal S1, S2, no murmurs Lungs: CTA bilaterally, no wheezes, rhonchi, or rales Pulses: 2+ radial pulses, 2+ pedal pulses, normal cap refill Ext: no edema, calves nontender, no asymmetry    Chest xray 01/15/23: IMPRESSION: Diffuse bilateral coarsening of the interstitium which may represent bronchiolitis or reactive airways disease. No focal consolidation.   Assessment: Encounter Diagnoses  Name Primary?   Subacute cough Yes   Wheezing    Rheumatoid arthritis, involving unspecified site, unspecified whether rheumatoid factor present (HCC)    Gastroesophageal reflux disease, unspecified whether esophagitis present      Plan: We discussed her recent flareup of breathing which was likely from a respiratory tract infection or other allergen.  Her reflux could have played a role as well.  At this point no need for additional antibiotics.  Add Advair for the next 1 to 2 weeks.  Discussed proper use of medication.  Continue albuterol rescue inhaler as needed.  Avoid taking any more Mucinex D.  She can use plain Mucinex if needed.  Continue good water intake.  Avoid exposure out in the heat since it is 90+ degrees this week.  If not much improved within the next few days let me know  I am  going to have her go back and see pulmonology.  Her last visit with pulmonology was 2022 prior to her recent rheumatoid arthritis diagnosis  I recommend she make appointment to follow-up with gastroenterology, Dr. Mora Appl to have endoscopy and H. pylori testing.  She currently has continued on long-term PPI and H2 blocker   Marquis was seen today for follow-up.  Diagnoses and all orders for this visit:  Subacute cough  Wheezing  Rheumatoid arthritis, involving unspecified site, unspecified whether rheumatoid factor present (HCC)  Gastroesophageal reflux disease, unspecified whether esophagitis present  Other orders -     fluticasone-salmeterol (ADVAIR) 100-50 MCG/ACT AEPB; Inhale 1 puff into the lungs 2 (two) times daily.    Follow up: with pulmonology

## 2023-01-29 NOTE — Progress Notes (Signed)
Sent note to [facility]

## 2023-02-05 ENCOUNTER — Telehealth: Payer: Self-pay | Admitting: Medical

## 2023-02-05 ENCOUNTER — Other Ambulatory Visit: Payer: Self-pay | Admitting: Medical

## 2023-02-05 DIAGNOSIS — K219 Gastro-esophageal reflux disease without esophagitis: Secondary | ICD-10-CM

## 2023-02-05 DIAGNOSIS — J4540 Moderate persistent asthma, uncomplicated: Secondary | ICD-10-CM

## 2023-02-05 DIAGNOSIS — R0600 Dyspnea, unspecified: Secondary | ICD-10-CM

## 2023-02-05 MED ORDER — GLYCOPYRROLATE-FORMOTEROL 9-4.8 MCG/ACT IN AERO: 2.0000 | INHALATION_SPRAY | Freq: Two times a day (BID) | RESPIRATORY_TRACT | 1 refills | Status: DC

## 2023-02-05 NOTE — Telephone Encounter (Signed)
Pt called again and I advised her Shane's previous message. She says pulmonology has not yet called her.

## 2023-02-05 NOTE — Telephone Encounter (Signed)
Pt called you back. You were in a room I let her know you would try and call again.

## 2023-02-05 NOTE — Telephone Encounter (Signed)
Left message for pt to call me back 

## 2023-02-05 NOTE — Telephone Encounter (Signed)
Pt called  Still having wheezing, feels "something there"  albuterol inhaler ( old one, she went back to) because She had to go to ER because new inhaler caused elevated heart rate  Still coughing up yellow mucous, still on mucinex  finished prednisone and antobiotic

## 2023-02-11 ENCOUNTER — Telehealth: Payer: Self-pay | Admitting: Internal Medicine

## 2023-02-11 NOTE — Telephone Encounter (Signed)
Pt. Having breathing issues please advise pt.

## 2023-02-12 NOTE — Telephone Encounter (Signed)
Spoke with patient and she states she is not currently having any difficulty breathing. She feels she is not improving despite multiple atbx and pred rxs. She was given Wixela but cannot take, giver her palpitations. She would like sooner OV to discuss treatment options. Appt scheduled for 02/20/23. Nothing further needed at this time.

## 2023-02-13 ENCOUNTER — Other Ambulatory Visit: Payer: Self-pay | Admitting: Medical

## 2023-02-13 ENCOUNTER — Ambulatory Visit: Payer: 59

## 2023-02-13 ENCOUNTER — Telehealth: Payer: Self-pay | Admitting: Medical

## 2023-02-13 ENCOUNTER — Ambulatory Visit
Admission: RE | Admit: 2023-02-13 | Discharge: 2023-02-13 | Disposition: A | Discharge status: Home / Self Care | Payer: 59 | Source: Ambulatory Visit | Attending: Medical | Admitting: Medical

## 2023-02-13 DIAGNOSIS — R059 Cough, unspecified: Secondary | ICD-10-CM

## 2023-02-13 NOTE — Telephone Encounter (Signed)
Pt informed per Vincenza Hews to go to [facility] for chest xray

## 2023-02-13 NOTE — Telephone Encounter (Signed)
Still having cough and chest cong, would like to get chest xray No improvement, still using albuterol  because one inhaler you gave caused her heart to race and second inhaler you sent is not covered be her insurance   Coughs up yellow mucous when she can get it up

## 2023-02-13 NOTE — Telephone Encounter (Signed)
BEVESPI Glycopyrrolate-formoterol approved went thru for $123 with ins.  Activated co pay card brought cost down to $35.  Called pt & informed.  She will pick up.  She has been using albuterol  3-4 x a day.  She had CXR done today.  No better, actually worse,  wheezing now during day.  Finished antibiotics last Tuesday.  She stopped her Methotrexate & hasn't been back on it incase she was put on another round of antibiotics but is due to start back on that very soon.  Would like to know what you recommend?

## 2023-02-18 ENCOUNTER — Telehealth: Payer: Self-pay | Admitting: Medical

## 2023-02-18 NOTE — Telephone Encounter (Signed)
Results have not come back yet.

## 2023-02-18 NOTE — Telephone Encounter (Signed)
Pt called about xray results. I let her know Vincenza Hews is out of the office this week and when he returns and looks over results she will get a call.

## 2023-02-18 NOTE — Telephone Encounter (Signed)
Called pt & she has appt with pulmonologist 02/20/23, states inhaler helping some, still using albuterol prn.  Advised CXR results still not in, advised we will her call back when results are in.

## 2023-02-19 NOTE — Progress Notes (Deleted)
Subjective:    Patient ID: Kristy [name], female   DOB: 01/18/1971    MRN: [id number]    Brief patient profile:  79 yobf never smoker works as Interior and spatial designer  with sinus issues around ? 2000 esp in fall better while she could get Tavist then 2016 eval by allergy  ? [facility] office pos dust/ ragweed didn't rec any shots and no f/u then variable sob assoc sense of congestion referred to pulmonary clinic 09/04/2016 by Boone Master.   History of Present Illness  09/04/2016 1st [facility] office visit/ Grethel Zenk   Chief Complaint  Patient presents with   Pulmonary Consult    Referred by Dr. Aleen Campi.  Pt c/o SOB for the past several months. She states also having some chest congestion. She has had cough x 2 wks- non prod and sometimes to the point of vomitting. Sometimes cough wakes her up at night.  She states she gets completely out of breath just walking one flight of stairs.   indolent onset 12/2015 of chest "congestion" and need to clear throat  present every day since onset onset   and unable to consistently lie flat due to choking sensation assoc hoarseness and dysphagia/  but s excess/ purulent sputum or mucus plugs. Cough so bad vomits / worse with albuterol  Better since started prilosec but not taking consistently Last pred x 6 days not recently ? Helped some  rec Start singulair 10 mg every evening  Change Omeprazole 40 mg   Take  30-60 min before first meal of the day and Pepcid (famotidine)  20 mg one @  bedtime until return to office - this is the best way to tell whether stomach acid is contributing to your problem.  GERD  Diet    10/02/2016  f/u ov/Demani Weyrauch re: cough since 12/2015 finally better c/w UACS  Chief Complaint  Patient presents with   Follow-up    cough is much better  maint on ppi/h2hs and singulair with min daytime > noct cough and no sob rec Stay on the same medication x 2 months then try off prilosec but take pepcid 20 mg after bfast and supper an if your  symptoms flare off prilosec then you need to see a GI and I will be happy to refer GERD diet   Please schedule a follow up visit in 3 months but call sooner if needed > did not return    08/02/2020  Re-establish ov/Leilanie Rauda re:  Sob > cough >>  was taking prn  gerd rx but 3 m prior to OV  Resumed daily prilosec at bedtime and sense of nasal/chest congestion some better on arnuity but sense of  Congestion persists  Chief Complaint  Patient presents with   Shortness of Breath    Intermittent dyspnea improved with inhaler prescribed by PCP recently  Dyspnea: steps are a problem, long walks at fast pace also cause sob some better on arnuity =breo same response Cough: lots of nasal congestion/ green mucus in ams x years  Sleeping: does fine p zyrtec hs flat bed  SABA use: none  02: no rec Singulair 10 mg every evening with Pepcid 20 mg - both after supper. Prilosec 40 mg Take 30-60 min before first meal of the day  Stop inhalers for now  Prednisone 10 mg take  4 each am x 2 days,   2 each am x 2 days,  1 each am x 2 days and stop  Please schedule a follow up  office visit in 4 weeks, sooner if needed  with all medications /inhalers/ solutions in hand so we can verify exactly what you are taking. This includes all medications from all doctors and over the counters   08/29/2020  f/u ov/Deya Bigos re: uacs/ better off all inhalers  Chief Complaint  Patient presents with   Follow-up    Breathing is doing well and she states has very little cough- occ prod with clear sputum.    Dyspnea:  Better despite no inhalers  Cough: typically around 10 mg p 7 pm supper - worse p spicy foods  Sleeping: not typically waking her up  SABA use: none 02: none  Covid status:   vax 3  rec Prilosec should be 30 min before you eat but should be taken at least a few weeks to see any benefit (take before supper since symptoms seem worse after supper. For drainage / throat tickle try take CHLORPHENIRAMINE  4 mg  Pulmonary  follow up is as needed     11/07/2020  f/u ov/Martina Brodbeck re:  Onset 10/28/20  15 min p dog exposure in [place] > eyes watering /nasal drainage and stayed in same house exp thru 10/30/20 but still coughing to point of vomiting worst 11/04/20 > seen 4/21 by  PCP rx albuterol / no longer  Chief Complaint  Patient presents with   Follow-up    Had reaction 2 wks. Ago, sob-better now, cough lt. Yellow, coughing so hard 4 days ago vomited, no fcs, occass. Wheezing, pnd   Acute Visit  Dyspnea:  Mostly with coughing  Cough: slt yellow/ worse p saba  Sleeping: 45 degrees otherwise coughs SABA use: once or twice daily  02: none  Covid status:   vax 3 pfizer  rec At onset of any cough make sure  Prilosec Take 30- 60 min before your first and last meals of the day  For drainage / throat tickle /sneezing instead  Of zyrtec try take CHLORPHENIRAMINE  4 mg   Continue singulair/ pepcid daily  Prednisone 10 mg take  4 each am x 2 days,   2 each am x 2 days,  1 each am x 2 days and stop  For cough > hycodan 1-2 tsp every 4 hours as needed GERD diet    12/05/2020  f/u ov/Dyani Babel re: allergic rhinitis/ recurrent cough  Chief Complaint  Patient presents with   Follow-up    Cough has resolved. She has some nasal congestion with yellow nasal d/c. She has not had to use her albuterol inhaler in the past 2 wks.   Dyspnea:  Not limited by breathing from desired activities   Cough: minimal assoc with nasal drainage /flonase  Sleeping: able to lie flat SABA use: none  02: none  Rec No change in medications for now Continue flonase one twice daily - point it toward the ear on the same side  I will be referring you to [facility] seen  02/21/21:  Allergic rhinitis with conjunctivitis  -Environmental allergy testing is positive to grass pollen, weed pollen, tree pollen, molds, dust mite cat and dog.  -Allergen avoidance measures discussed/handouts provided -Recommend use of Zyrtec 10 mg daily.  Can take  additional dose if necessary.  Other long-acting antihistamines including Xyzal and Allegra that may be equally or more effective.  These are all over-the-counter options -Continue Singulair 10 mg nightly -trial dymista 1 spray each nostril twice a day.  This is a combination nasal spray with Flonase + Astelin (  nasal antihistamine).  This helps with both nasal congestion and drainage.  If this is not covered by insurance then will prescribe the 2 sprays separately -For itchy watery eyes use olopatadine 0.2% 1 drop each eye daily as needed -allergen immunotherapy discussed today including protocol, benefits and risk.  Informational handout provided.  If interested in this therapuetic option you can check with your insurance carrier for coverage.      02/20/2023  f/u ov/[place] office/Dion Parrow re: cough x 2017 c/w uacs from allergic rhinitis  recurrent pattern  maint on ***  No chief complaint on file.   Dyspnea:  *** Cough: *** Sleeping: *** SABA use: *** 02: *** Covid status: *** Lung cancer screening: ***   No obvious day to day or daytime variability or assoc excess/ purulent sputum or mucus plugs or hemoptysis or cp or chest tightness, subjective wheeze or overt sinus or hb symptoms.   *** without nocturnal  or early am exacerbation  of respiratory  c/o's or need for noct saba. Also denies any obvious fluctuation of symptoms with weather or environmental changes or other aggravating or alleviating factors except as outlined above   No unusual exposure hx or h/o childhood pna/ asthma or knowledge of premature birth.  Current Allergies, Complete Past Medical History, Past Surgical History, Family History, and Social History were reviewed in [employer] record.  ROS  The following are not active complaints unless bolded Hoarseness, sore throat, dysphagia, dental problems, itching, sneezing,  nasal congestion or discharge of excess mucus or purulent secretions, ear  ache,   fever, chills, sweats, unintended wt loss or wt gain, classically pleuritic or exertional cp,  orthopnea pnd or arm/hand swelling  or leg swelling, presyncope, palpitations, abdominal pain, anorexia, nausea, vomiting, diarrhea  or change in bowel habits or change in bladder habits, change in stools or change in urine, dysuria, hematuria,  rash, arthralgias, visual complaints, headache, numbness, weakness or ataxia or problems with walking or coordination,  change in mood or  memory.        No outpatient medications have been marked as taking for the 02/20/23 encounter (Appointment) with Nyoka Cowden, MD.                           Objective:   Physical Exam   Wts  02/20/2023         ***  12/05/2020       221  11/07/2020       222 08/29/2020       219  08/02/2020       217  10/02/2016      213   09/04/16 209 lb (94.8 kg)  08/27/16 216 lb (98 kg)  08/20/16 214 lb 9.6 oz (97.3 kg)    Vital signs reviewed  02/20/2023  - Note at rest 02 sats  ***% on ***   General appearance:    ***                Assessment:

## 2023-02-20 ENCOUNTER — Encounter: Payer: Self-pay | Admitting: Internal Medicine

## 2023-02-20 ENCOUNTER — Ambulatory Visit: Payer: 59 | Admitting: Internal Medicine

## 2023-02-20 VITALS — BP 121/84 | HR 80 | Ht 65.0 in | Wt 230.0 lb

## 2023-02-20 DIAGNOSIS — R058 Other specified cough: Secondary | ICD-10-CM | POA: Diagnosis not present

## 2023-02-20 DIAGNOSIS — R0609 Other forms of dyspnea: Secondary | ICD-10-CM

## 2023-02-20 MED ORDER — METHYLPREDNISOLONE ACETATE 80 MG/ML IJ SUSP
120.0000 mg | Freq: Once | INTRAMUSCULAR | Status: AC
Start: 2023-02-20 — End: 2023-02-20
  Administered 2023-02-20: 120 mg via INTRAMUSCULAR

## 2023-02-20 MED ORDER — TRAMADOL HCL 50 MG PO TABS: 50.0000 mg | ORAL_TABLET | ORAL | 0 refills | Status: DC | PRN

## 2023-02-20 NOTE — Assessment & Plan Note (Addendum)
Onset around 2000 - [facility] eval 2016 did not rec shots for dust/ ragweed allergy identified  -Trial of max gerd rx/ singulair 09/04/2016 >> improved 10/02/2016 > changed to prn gerd rx and ok until 04/2021 with sob / "congestion"  - placed back on daily singulair, ppi ac in and pepcid 20 in pm and d/c all inhalers  - Allergy profile 08/02/2020 >  Eos 0.1 /  IgE  2 - sinus CT 08/08/2020  No evidence of inflammatory sinus disease. - Flare mid April 2022 p dog exp > cyclical cough rx / no inhalers > resolved as of 12/05/2020  - 12/05/2020 referred back to Dr Lucie Leather seen 02/21/21 rec add dymista (pt denies taking it)  - 12/2022 flare ? Related to d/c pm Pepcid and not taking am PPI ac > 02/20/2023 rx cyclical cough regimen with tramadol   Upper airway cough syndrome (previously labeled PNDS),  is so named because it's frequently impossible to sort out how much is  CR/sinusitis with freq throat clearing (which can be related to primary GERD)   vs  causing  secondary (" extra esophageal")  GERD from wide swings in gastric pressure that occur with throat clearing, often  promoting self use of mint and menthol lozenges that reduce the lower esophageal sphincter tone and exacerbate the problem further in a cyclical fashion.   These are the same pts (now being labeled as having "irritable larynx syndrome" by some cough centers) who not infrequently have a history of having failed to tolerate ace inhibitors,  dry powder inhalers or biphosphonates or report having atypical/extraesophageal reflux symptoms that don't respond to standard doses of PPI  and are easily confused as having aecopd or asthma flares by even experienced allergists/ pulmonologists (myself included).   Of the three most common causes of  Sub-acute / recurrent or chronic cough, only one (GERD)  can actually contribute to/ trigger  the other two (asthma and post nasal drip syndrome)  and perpetuate the cylce of cough.  While not intuitively obvious, many  patients with chronic low grade reflux do not cough until there is a primary insult that disturbs the protective epithelial barrier and exposes sensitive nerve endings.   This is typically viral but can due to PNDS and  either may apply here.     >>> The point is that once this occurs, it is difficult to eliminate the cycle  using anything but a maximally effective acid suppression regimen at least in the short run, accompanied by an appropriate diet to address non acid GERD and control / eliminate the cough itself for at least 3 days with tramadol plus 1st gen H1 blockers per guidelines  and >>> also added  depomedrol 120 mIM  in case of component of Th-2 driven upper or lower airways inflammation (if cough responds short term only to relapse before return while will on full rx for uacs (as above), then  that would point to allergic rhinitis/ asthma or eos bronchitis as alternative dx)   >>> hold lama/lab and just use saba prn sob, not cough as hfa may aggravate UACS   F/u in 5 days if cough not eliminated          Each maintenance medication was reviewed in detail including emphasizing most importantly the difference between maintenance and prns and under what circumstances the prns are to be triggered using an action plan format where appropriate.  Total time for H and P, chart review, counseling, reviewing hfa device(s) and  generating customized AVS unique to this office visit / same day charting > 40 min for multiple chronic  refractory respiratory  symptoms of uncertain etiology

## 2023-02-20 NOTE — Patient Instructions (Addendum)
Depomedrol 120 mg IM today   Take Mucinex dm 1200 mg  every 12 hours and supplement if needed with  tramadol 50 mg up to 2 every 4 hours to suppress the urge to cough. Swallowing water and/or using ice chips/non mint and menthol containing candies (such as lifesavers or sugarless jolly ranchers) are also effective.  You should rest your voice and avoid activities that you know make you cough.  Once you have eliminated the cough for 3 straight days try reducing the tramadol first,  then the mucinex dm as tolerated.    Stop bevespi   Only use your albuterol as a rescue medication to be used if you can't catch your breath by resting or doing a relaxed purse lip breathing pattern.  - The less you use it, the better it will work when you need it. - Ok to use up to 2 puffs  every 4 hours if you must but call for immediate appointment if use goes up over your usual need - Don't leave home without it !!  (think of it like the spare tire for your car)    Omerazole (prilosec ) 40 mg   Take  30-60 min before first meal of the day and Pepcid (famotidine)  20 mg after supper until return to office - this is the best way to tell whether stomach acid is contributing to your problem.    GERD (REFLUX)  is an extremely common cause of respiratory symptoms just like yours , many times with no obvious heartburn at all.    It can be treated with medication, but also with lifestyle changes including elevation of the head of your bed (ideally with 6 -8inch blocks under the headboard of your bed),  Smoking cessation, avoidance of late meals, excessive alcohol, and avoid fatty foods, chocolate, peppermint, colas, red wine, and acidic juices such as orange juice.  NO MINT OR MENTHOL PRODUCTS SO NO COUGH DROPS  USE SUGARLESS CANDY INSTEAD (Jolley ranchers or Stover's or Life Savers) or even ice chips will also do - the key is to swallow to prevent all throat clearing. NO OIL BASED VITAMINS - use powdered substitutes.   Avoid fish oil when coughing.   For drainage / throat tickle try take CHLORPHENIRAMINE  4 mg  ("Allergy Relief" 4mg   at [facility] should be easiest to find in the blue box usually on bottom shelf)  take one every 4 hours as needed - extremely effective and inexpensive over the counter- may cause drowsiness so start with just a dose or two an hour before bedtime and see how you tolerate it before trying in daytime.   If not better by 02/24/23 we need to see you with all medications and inhalers

## 2023-02-20 NOTE — Progress Notes (Signed)
Subjective:    Patient ID: Kristy [name], female   DOB: 06-04-1971    MRN: [id number]    Brief patient profile:  89 yobf never smoker works as Interior and spatial designer  with sinus issues around ? 2000 esp in fall better while she could get Tavist then 2016 eval by allergy  ? [facility] pos dust/ ragweed didn't rec any shots and no f/u then variable sob assoc sense of congestion referred to pulmonary clinic 09/04/2016 by Boone Master.  New dx of RA hand early 2024 >  methotrexate / plaquenil some better    History of Present Illness  09/04/2016 1st [facility] office visit/    Chief Complaint  Patient presents with   Pulmonary Consult    Referred by Dr. Aleen Campi.  Pt c/o SOB for the past several months. She states also having some chest congestion. She has had cough x 2 wks- non prod and sometimes to the point of vomitting. Sometimes cough wakes her up at night.  She states she gets completely out of breath just walking one flight of stairs.   indolent onset 12/2015 of chest "congestion" and need to clear throat  present every day since onset onset   and unable to consistently lie flat due to choking sensation assoc hoarseness and dysphagia/  but s excess/ purulent sputum or mucus plugs. Cough so bad vomits / worse with albuterol  Better since started prilosec but not taking consistently Last pred x 6 days not recently ? Helped some  rec Start singulair 10 mg every evening  Change Omeprazole 40 mg   Take  30-60 min before first meal of the day and Pepcid (famotidine)  20 mg one @  bedtime until return to office - this is the best way to tell whether stomach acid is contributing to your problem.  GERD  Diet    10/02/2016  f/u ov/ re: cough since 12/2015 finally better c/w UACS  Chief Complaint  Patient presents with   Follow-up    cough is much better  maint on ppi/h2hs and singulair with min daytime > noct cough and no sob rec Stay on the same medication x 2 months then try off  prilosec but take pepcid 20 mg after bfast and supper an if your symptoms flare off prilosec then you need to see a GI and I will be happy to refer GERD diet   Please schedule a follow up visit in 3 months but call sooner if needed > did not return     12/05/2020  f/u ov/ re: allergic rhinitis/ recurrent cough  Chief Complaint  Patient presents with   Follow-up    Cough has resolved. She has some nasal congestion with yellow nasal d/c. She has not had to use her albuterol inhaler in the past 2 wks.   Dyspnea:  Not limited by breathing from desired activities   Cough: minimal assoc with nasal drainage /flonase  Sleeping: able to lie flat SABA use: none  02: none  Rec No change in medications for now Continue flonase one twice daily - point it toward the ear on the same side  I will be referring you to [facility] seen  02/21/21:  Allergic rhinitis with conjunctivitis  -Environmental allergy testing is positive to grass pollen, weed pollen, tree pollen, molds, dust mite cat and dog.  -Allergen avoidance measures discussed/handouts provided -Recommend use of Zyrtec 10 mg daily.  Can take additional dose if necessary.  Other long-acting antihistamines including Xyzal and  Allegra that may be equally or more effective.  These are all over-the-counter options -Continue Singulair 10 mg nightly -trial dymista 1 spray each nostril twice a day.  This is a combination nasal spray with Flonase + Astelin (nasal antihistamine).  This helps with both nasal congestion and drainage.  If this is not covered by insurance then will prescribe the 2 sprays separately -For itchy watery eyes use olopatadine 0.2% 1 drop each eye daily as needed -allergen immunotherapy discussed today including protocol, benefits and risk.  Informational handout provided.  If interested in this therapuetic option you can check with your insurance carrier for coverage.      02/20/2023  ACUTE  ov/[place] office/  re: cough x 2017 c/w uacs from allergic rhinitis  recurrent pattern  maint on zyrtec and singulair/ no nasal sprays  Does not remember allergy recs or whether tried dymista  / cough onset may have correlated with temporary d/c pepcid in pm assoc with noct "wheeze"  Chief Complaint  Patient presents with   Shortness of Breath  Dyspnea:  onset x 2 m worse assoc with cough > sensation of mucus stuck in chest (points to midline) > min mucoid/ assoc pnds nothing purulent/ subj wheeze esp hs / no better with abx bevespi prednisone /multiple providers and "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    Also denies any obvious fluctuation of symptoms with weather or environmental changes or other aggravating or alleviating factors except as outlined above   No unusual exposure hx or h/o childhood pna/ asthma or knowledge of premature birth.  Current Allergies, Complete Past Medical History, Past Surgical History, Family History, and Social History were reviewed in [employer] record.  ROS  The following are not active complaints unless bolded Hoarseness, sore throat, dysphagia, dental problems, itching, sneezing,  nasal congestion or discharge of excess mucus or purulent secretions, ear ache,   fever, chills, sweats, unintended wt loss or wt gain, classically pleuritic or exertional cp,  orthopnea pnd or arm/hand swelling  or leg swelling, presyncope, palpitations, abdominal pain, anorexia, nausea, vomiting, diarrhea  or change in bowel habits or change in bladder habits, change in stools or change in urine, dysuria, hematuria,  rash, arthralgias, visual complaints, headache, numbness, weakness or ataxia or problems with walking or  coordination,  change in mood or  memory.        Current Meds  Medication Sig   albuterol (VENTOLIN HFA) 108 (90 Base) MCG/ACT inhaler Inhale 2 puffs into the lungs every 6 (six) hours as needed for wheezing or shortness of breath.   aspirin EC 81 MG tablet Take 1 tablet (81 mg total) by mouth daily. Swallow whole.   Azelaic Acid 15 % gel Apply topically at bedtime.   Azelaic Acid 15 % gel Apply 1 Application topically 2 (two) times daily.   cetirizine (ZYRTEC) 10 MG tablet Take 1 tablet (10 mg total) by mouth daily.   Evolocumab (REPATHA SURECLICK) 140 MG/ML SOAJ Inject 140 mg into the skin every 14 (fourteen) days.   famotidine (PEPCID) 20 MG tablet TAKE ONE TABLET BY MOUTH AFTER SUPPER   Fluocinolone Acetonide Scalp 0.01 %  OIL Apply 1 Application topically as directed. APPLY DIRECTLY TO SCALP DAILY WHEN ITCHING THE 1-2 TIMES WEEKLY FOR MAINTENANCE   folic acid (FOLVITE) 1 MG tablet Take 1 mg by mouth daily.   Glycopyrrolate-Formoterol 9-4.8 MCG/ACT AERO Inhale 2 puffs into the lungs 2 (two) times daily.   hydroxychloroquine (PLAQUENIL) 200 MG tablet Take 400 mg by mouth daily.   ketoconazole (NIZORAL) 2 % cream Apply 1 Application topically 2 (two) times daily as needed for irritation.   ketoconazole (NIZORAL) 2 % shampoo Apply 1 Application topically 2 (two) times a week.   methotrexate (RHEUMATREX) 2.5 MG tablet Take 10 mg by mouth once a week.   montelukast (SINGULAIR) 10 MG tablet TAKE 1 TABLET BY MOUTH EVERYDAY AT BEDTIME   omeprazole (PRILOSEC) 40 MG capsule Take 1 capsule (40 mg total) by mouth daily. TAKE 1 CAPSULE BY MOUTH EVERY DAY   sulconazole nitrate 1 % external solution Apply 1 Application topically daily.   triamcinolone ointment (KENALOG) 0.1 % Apply 1 Application topically 2 (two) times daily.                Objective:   Physical Exam   Wts  02/20/2023         230  12/05/2020       221  11/07/2020       222 08/29/2020       219  08/02/2020       217  10/02/2016       213   09/04/16 209 lb (94.8 kg)  08/27/16 216 lb (98 kg)  08/20/16 214 lb 9.6 oz (97.3 kg)    Vital signs reviewed  02/20/2023  - Note at rest 02 sats  95% on RA   General appearance:    amb bf harsh cough     HEENT : Oropharynx  min watery pnd      Nasal turbinates nl    NECK :  without  apparent JVD/ palpable Nodes/TM    LUNGS: no acc muscle use,  Nl contour chest which is clear to A and P bilaterally with cough on insp  maneuvers   CV:  RRR  no s3 or murmur or increase in P2, and no edema   ABD: obese  soft and nontender  MS:  Nl gait/ ext warm without deformities Or obvious joint restrictions  calf tenderness, cyanosis or clubbing    SKIN: warm and dry without lesions    NEURO:  alert, approp, nl sensorium with  no motor or cerebellar deficits apparent.       I personally reviewed images and agree with radiology impression as follows:  CXR:   pa and lateral No active cardiopulmonary disease.          Assessment:

## 2023-02-20 NOTE — Assessment & Plan Note (Signed)
Onset 2017 with cyclical cough  -  09/04/2016  Walked RA x 3 laps @ 185 ft each stopped due to  End of study, nl pace, no sob or desat    - 02/20/2023  After extensive coaching inhaler device,  effectiveness =    75% hfa > continue saba prn   Re SABA :  I spent extra time with pt today reviewing appropriate use of albuterol for prn use on exertion with the following points: 1) saba is for relief of sob that does not improve by walking a slower pace or resting but rather if the pt does not improve after trying this first. 2) If the pt is convinced, as many are, that saba helps recover from activity faster then it's easy to tell if this is the case by re-challenging : ie stop, take the inhaler, then p 5 minutes try the exact same activity (intensity of workload) that just caused the symptoms and see if they are substantially diminished or not after saba 3) if there is an activity that reproducibly causes the symptoms, try the saba 15 min before the activity on alternate days   If in fact the saba really does help, then fine to continue to use it prn but advised may need to look closer at the maintenance regimen (for now =0) being used to achieve better control of airways disease with exertion.

## 2023-02-21 NOTE — Telephone Encounter (Signed)
Please call with Xray results.

## 2023-02-23 NOTE — Progress Notes (Signed)
Results sent through MyChart

## 2023-02-27 ENCOUNTER — Ambulatory Visit: Payer: 59 | Admitting: Internal Medicine

## 2023-02-27 ENCOUNTER — Telehealth: Payer: Self-pay | Admitting: Internal Medicine

## 2023-02-27 ENCOUNTER — Encounter: Payer: Self-pay | Admitting: Internal Medicine

## 2023-02-27 VITALS — BP 120/82 | HR 79 | Temp 98.7°F | Ht 65.0 in | Wt 228.4 lb

## 2023-02-27 DIAGNOSIS — R058 Other specified cough: Secondary | ICD-10-CM

## 2023-02-27 DIAGNOSIS — R0609 Other forms of dyspnea: Secondary | ICD-10-CM

## 2023-02-27 LAB — POCT EXHALED NITRIC OXIDE: FeNO level (ppb): 122

## 2023-02-27 MED ORDER — PREDNISONE 10 MG PO TABS: ORAL_TABLET | ORAL | 0 refills | Status: DC

## 2023-02-27 MED ORDER — AMOXICILLIN-POT CLAVULANATE 875-125 MG PO TABS
1.0000 | ORAL_TABLET | Freq: Two times a day (BID) | ORAL | 0 refills | Status: DC
Start: 2023-02-27 — End: 2023-03-25

## 2023-02-27 MED ORDER — BECLOMETHASONE DIPROP HFA 40 MCG/ACT IN AERB: INHALATION_SPRAY | RESPIRATORY_TRACT | 11 refills | Status: DC

## 2023-02-27 NOTE — Telephone Encounter (Signed)
Tell pt due to insurance preference I changed asmanex to qvar 40 Take 2 puffs first thing in am and then another 2 puffs about 12 hours later and I sent in prednisone

## 2023-02-27 NOTE — Patient Instructions (Addendum)
For cough/ congestion >  Mucinex dm 1200  up to maximum of  1200 mg every 12 hours and use the flutter valve as much as you can    Augmentin 875 mg take one pill twice daily  X 10 days - take at breakfast and supper with large glass of water.  It would help reduce the usual side effects (diarrhea and yeast infections) if you ate cultured yogurt at lunch.  Bed blocks 6-8" and take the omeprazole 40 mg Take 30-60 min before first meal of the day   My office will be contacting you by phone for referral to ENT  for globus sensation  - if you don't hear back from my office within one week please call us back or notify us thru MyChart and we'll address it right away.   Please schedule a follow up office visit in 4 weeks, sooner if needed  Late add  - FENO 02/27/2023  = 122 > rx qvar 40 2bid/ pred x 6 d> called in

## 2023-02-27 NOTE — Progress Notes (Signed)
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 2000 esp in fall better while she could get Tavist then 2016 eval by allergy  ? [facility] pos dust/ ragweed didn't rec any shots and no f/u then variable sob assoc sense of congestion referred to pulmonary clinic 09/04/2016 by Boone Master.  New dx of RA hand early 2024 >  methotrexate / plaquenil some better    History of Present Illness  09/04/2016 1st [facility] office visit/ Madelaine Whipple   Chief Complaint  Patient presents with   Pulmonary Consult    Referred by Dr. Aleen Campi.  Pt c/o SOB for the past several months. She states also having some chest congestion. She has had cough x 2 wks- non prod and sometimes to the point of vomitting. Sometimes cough wakes her up at night.  She states she gets completely out of breath just walking one flight of stairs.   indolent onset 12/2015 of chest "congestion" and need to clear throat  present every day since onset onset   and unable to consistently lie flat due to choking sensation assoc hoarseness and dysphagia/  but s excess/ purulent sputum or mucus plugs. Cough so bad vomits / worse with albuterol  Better since started prilosec but not taking consistently Last pred x 6 days not recently ? Helped some  rec Start singulair 10 mg every evening  Change Omeprazole 40 mg   Take  30-60 min before first meal of the day and Pepcid (famotidine)  20 mg one @  bedtime until return to office - this is the best way to tell whether stomach acid is contributing to your problem.  GERD  Diet    10/02/2016  f/u ov/Alexandrya Chim re: cough since 12/2015 finally better c/w UACS  Chief Complaint  Patient presents with   Follow-up    cough is much better  maint on ppi/h2hs and singulair with min daytime > noct cough and no sob rec Stay on the same medication x 2 months then try off  prilosec but take pepcid 20 mg after bfast and supper an if your symptoms flare off prilosec then you need to see a GI and I will be happy to refer GERD diet   Please schedule a follow up visit in 3 months but call sooner if needed > did not return     12/05/2020  f/u ov/Phillipe Clemon re: allergic rhinitis/ recurrent cough  Chief Complaint  Patient presents with   Follow-up    Cough has resolved. She has some nasal congestion with yellow nasal d/c. She has not had to use her albuterol inhaler in the past 2 wks.   Dyspnea:  Not limited by breathing from desired activities   Cough: minimal assoc with nasal drainage /flonase  Sleeping: able to lie flat SABA use: none  02: none  Rec No change in medications for now Continue flonase one twice daily - point it toward the ear on the same side  I will be referring you to [facility] seen  02/21/21:  Allergic rhinitis with conjunctivitis  -Environmental allergy testing is positive to grass pollen, weed pollen, tree pollen, molds, dust mite cat and dog.  -Allergen avoidance measures discussed/handouts provided -Recommend use of Zyrtec 10 mg daily.  Can take additional dose if necessary.  Other long-acting antihistamines including Xyzal and  Allegra that may be equally or more effective.  These are all over-the-counter options -Continue Singulair 10 mg nightly -trial dymista 1 spray each nostril twice a day.  This is a combination nasal spray with Flonase + Astelin (nasal antihistamine).  This helps with both nasal congestion and drainage.  If this is not covered by insurance then will prescribe the 2 sprays separately -For itchy watery eyes use olopatadine 0.2% 1 drop each eye daily as needed -allergen immunotherapy discussed today including protocol, benefits and risk.  Informational handout provided.  If interested in this therapuetic option you can check with your insurance carrier for coverage.      02/20/2023  ACUTE  ov/[place] office/Trennon Torbeck  re: cough x 2017 c/w uacs from allergic rhinitis  recurrent pattern  maint on zyrtec and singulair/ no nasal sprays  Does not remember allergy recs or whether tried dymista  / cough onset may have correlated with temporary d/c pepcid in pm assoc with noct "wheeze"  Chief Complaint  Patient presents with   Shortness of Breath  Dyspnea:  onset x 2 m worse assoc with cough > sensation of mucus stuck in chest (points to midline) > min mucoid/ assoc pnds nothing purulent/ subj wheeze esp hs / no better with abx bevespi prednisone /multiple providers and "nothing 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   Stop bevespi  Only use your albuterol as a rescue medication Omerazole (prilosec ) 40 mg   Take  30-60 min before first meal of the day and Pepcid (famotidine)  20 mg after supper until return to office  GERD diet reviewed, bed blocks rec For drainage / throat tickle try take CHLORPHENIRAMINE  4 mg  If not better by 02/24/23 we need to see you with all medications and inhalers     02/27/2023  f/u ov/Shayonna Ocampo re: cough x 2017  maint on no inhalers    Chief Complaint  Patient presents with   Acute Visit    Productive cough persistent x 1 month.  Congestion mostly in throat.  Dyspnea:  walking fast  Cough: much worse with laughter/ certain smells / mucusvariably green - worse 1st thing in am  Sense of globus  Sleeping: still on a flat bed / freq awakening due to cough and noisy breathing.  SABA use: rarely  02: none      No obvious day to day or daytime variability or assoc   mucus plugs or hemoptysis or cp or chest tightness, subjective wheeze  or overt sinus or hb symptoms.     Also denies any obvious fluctuation of symptoms with weather or environmental changes or other aggravating or alleviating factors except as outlined above   No unusual exposure hx or h/o childhood pna/ asthma or knowledge of premature birth.  Current Allergies, Complete Past Medical History, Past Surgical History, Family History, and Social History were reviewed in [employer] record.  ROS  The following are not active complaints unless bolded Hoarseness, sore throat, dysphagia, dental problems, itching, sneezing,  nasal  congestion or discharge of excess mucus or purulent secretions, ear ache,   fever, chills, sweats, unintended wt loss or wt gain, classically pleuritic or exertional cp,  orthopnea pnd or arm/hand swelling  or leg swelling, presyncope, palpitations, abdominal pain, anorexia, nausea, vomiting, diarrhea  or change in bowel habits or change in bladder habits, change in stools or change in urine, dysuria, hematuria,  rash, arthralgias, visual complaints, headache, numbness, weakness or ataxia or problems with walking or coordination,  change in mood or  memory.        Current Meds  Medication Sig   albuterol (VENTOLIN HFA) 108 (90 Base) MCG/ACT inhaler Inhale 2 puffs into the lungs every 6 (six) hours as needed for wheezing or shortness of breath.   aspirin EC 81 MG tablet Take 1 tablet (81 mg total) by mouth daily. Swallow whole.   Azelaic Acid 15 % gel Apply topically at bedtime.   Azelaic Acid 15 % gel Apply 1 Application topically 2 (two) times daily.   cetirizine (ZYRTEC) 10 MG tablet Take 1 tablet (10 mg total) by mouth daily.   Evolocumab (REPATHA SURECLICK) 140 MG/ML SOAJ Inject 140 mg into the skin every 14 (fourteen) days.   famotidine (PEPCID) 20 MG tablet TAKE ONE TABLET BY MOUTH AFTER SUPPER   Fluocinolone Acetonide Scalp 0.01 % OIL Apply 1 Application topically as directed. APPLY DIRECTLY TO SCALP DAILY WHEN  ITCHING THE 1-2 TIMES WEEKLY FOR MAINTENANCE   folic acid (FOLVITE) 1 MG tablet Take 1 mg by mouth daily.   hydroxychloroquine (PLAQUENIL) 200 MG tablet Take 400 mg by mouth daily.   ketoconazole (NIZORAL) 2 % cream Apply 1 Application topically 2 (two) times daily as needed for irritation.   ketoconazole (NIZORAL) 2 % shampoo Apply 1 Application topically 2 (two) times a week.   methotrexate (RHEUMATREX) 2.5 MG tablet Take 10 mg by mouth once a week.   montelukast (SINGULAIR) 10 MG tablet TAKE 1 TABLET BY MOUTH EVERYDAY AT BEDTIME   omeprazole (PRILOSEC) 40 MG capsule Take 1 capsule (40 mg total) by mouth daily. TAKE 1 CAPSULE BY MOUTH EVERY DAY   sulconazole nitrate 1 % external solution Apply 1 Application topically daily.   traMADol (ULTRAM) 50 MG tablet Take 1 tablet (50 mg total) by mouth every 4 (four) hours as needed (cough).   triamcinolone ointment (KENALOG) 0.1 % Apply 1 Application topically 2 (two) times daily.                Objective:   Physical Exam   Wts  02/20/2023         230  12/05/2020       221  11/07/2020       222 08/29/2020       219  08/02/2020       217  10/02/2016      213   09/04/16 209 lb (94.8 kg)  08/27/16 216 lb (98 kg)  08/20/16 214 lb 9.6 oz (97.3 kg)      Vital signs reviewed  02/27/2023  - Note at rest 02 sats  100% on RA    General appearance:    amb wf nad     HEENT : Oropharynx  clear     Nasal turbinates nl    NECK :  without  apparent JVD/ palpable Nodes/TM    LUNGS: no acc muscle use,  Nl contour chest which is clear to A and P bilaterally without cough on insp or exp maneuvers  CV:  RRR  no s3 or murmur or increase in P2, and no edema   ABD:  soft and nontender with nl inspiratory excursion in the supine position. No bruits or organomegaly appreciated   MS:  Nl gait/ ext warm without deformities Or obvious joint restrictions  calf tenderness, cyanosis or clubbing    SKIN: warm and dry without lesions    NEURO:  alert, approp,  nl sensorium with  no motor or cerebellar deficits apparent.            Assessment:

## 2023-02-27 NOTE — Telephone Encounter (Signed)
Called patient.  Gave all information.  Patient verbalized understanding.

## 2023-02-28 ENCOUNTER — Telehealth: Payer: Self-pay | Admitting: Internal Medicine

## 2023-02-28 NOTE — Assessment & Plan Note (Addendum)
Onset around 2000 - Kozlow eval 2016 did not rec shots for dust/ ragweed allergy identified  -Trial of max gerd rx/ singulair 09/04/2016 >> improved 10/02/2016 > changed to prn gerd rx and ok until 04/2021 with sob / "congestion"  - placed back on daily singulair, ppi ac in and pepcid 20 in pm and d/c all inhalers  - Allergy profile 08/02/2020 >  Eos 0.1 /  IgE  2 - sinus CT 08/08/2020  No evidence of inflammatory sinus disease. - Flare mid April 2022 p dog exp > cyclical cough rx / no inhalers > resolved as of 12/05/2020  - 12/05/2020 referred back to Dr Lucie Leather seen 02/21/21 rec add dymista  - 12/2022 flare ? Related to d/c pm Pepcid and not taking am PPI ac > 02/20/2023 rx cyclical cough regimen with tramadol > improved somewhat 02/27/2023  - FENO 02/27/2023  = 122 > rx qvar 40 2bid/ pred x 6 d  The high FENO is suggestive she doe have asthma component but likely very sensitive to all inhalers so rec try the lowest dose ICS on market in [facility] form = qvar 40 as above and referred to ENT for globus sensation typical of upper airway cough syndrome (many cases of cough turn out to have more than one problem) while continineu max rx for gerd and pnds with  1st gen H1 blockers per guidelines    Discussed in detail all the  indications, usual  risks and alternatives  relative to the benefits with patient who agrees to proceed with Rx as outlined.             Each maintenance medication was reviewed in detail including emphasizing most importantly the difference between maintenance and prns and under what circumstances the prns are to be triggered using an action plan format where appropriate.  Total time for H and P, chart review, counseling, reviewing hfa/ flutter device(s) and generating customized AVS unique to this office visit / same day charting > 30 min for multiple  refractory respiratory  symptoms of uncertain etiology

## 2023-02-28 NOTE — Assessment & Plan Note (Signed)
Onset 2017 with cyclical cough  -  09/04/2016  Walked RA x 3 laps @ 185 ft each stopped due to  End of study, nl pace, no sob or desat    - 02/20/2023  After extensive coaching inhaler device,  effectiveness =    75% hfa > continue saba prn  - 02/27/2023   Walked on RA  x  3  lap(s) =  approx 750  ft  @ brisk pace, stopped due to end of study with lowest 02 sats 98%   Suspect the doe is related to conditioning and wt gain, not asthma, so no change in recs but needs to start doing regular sub max ex in safe clean cool envirnments

## 2023-02-28 NOTE — Telephone Encounter (Signed)
Patient called wanting to speak with Dr.Wert's nurse about appointment visit from yesterday 02/27/23. Please call back.

## 2023-03-03 NOTE — Telephone Encounter (Signed)
Spoke with patient. She was unsure of the name of the test she did at last OV. I explained to patient what a feno test is and what it helps Dr. Sherene Sires determine. She states new medication has really be helping and is starting to feel better. Advised patient to let us know if she needs anything else.  Closing encounter. NFN

## 2023-03-19 ENCOUNTER — Ambulatory Visit: Payer: 59 | Admitting: Internal Medicine

## 2023-03-23 NOTE — Progress Notes (Unsigned)
Subjective:    Patient ID: Kristy [name], female   DOB: 16-Apr-1971    MRN: [id number]    Brief patient profile:  40 yobf never smoker works as Interior and spatial designer  with sinus issues around ? 2000 esp in fall better while she could get Tavist then 2016 eval by allergy  ? Kristy [name]'s office pos dust/ ragweed didn't rec any shots and no f/u then variable sob assoc sense of congestion referred to pulmonary clinic 09/04/2016 by Kristy [name].  New dx of RA hand early 2024 >  methotrexate / plaquenil some better    History of Present Illness  09/04/2016 1st [facility] office visit/ Kristy [name]   Chief Complaint  Patient presents with   Pulmonary Consult    Referred by Dr. Aleen [name].  Pt c/o SOB for the past several months. She states also having some chest congestion. She has had cough x 2 wks- non prod and sometimes to the point of vomitting. Sometimes cough wakes her up at night.  She states she gets completely out of breath just walking one flight of stairs.   indolent onset 12/2015 of chest "congestion" and need to clear throat  present every day since onset onset   and unable to consistently lie flat due to choking sensation assoc hoarseness and dysphagia/  but s excess/ purulent sputum or mucus plugs. Cough so bad vomits / worse with albuterol  Better since started prilosec but not taking consistently Last pred x 6 days not recently ? Helped some  rec Start singulair 10 mg every evening  Change Omeprazole 40 mg   Take  30-60 min before first meal of the day and Pepcid (famotidine)  20 mg one @  bedtime until return to office - this is the best way to tell whether stomach acid is contributing to your problem.  GERD  Diet    10/02/2016  f/u ov/Kristy [name] re: cough since 12/2015 finally better c/w UACS  Chief Complaint  Patient presents with   Follow-up    cough is much better  maint on ppi/h2hs and singulair with min daytime > noct cough and no sob rec Stay on the same medication x 2 months then try off  prilosec but take pepcid 20 mg after bfast and supper an if your symptoms flare off prilosec then you need to see a GI and I will be happy to refer GERD diet   Please schedule a follow up visit in 3 months but call sooner if needed > did not return     12/05/2020  f/u ov/Kristy [name] re: allergic rhinitis/ recurrent cough  Chief Complaint  Patient presents with   Follow-up    Cough has resolved. She has some nasal congestion with yellow nasal d/c. She has not had to use her albuterol inhaler in the past 2 wks.   Dyspnea:  Not limited by breathing from desired activities   Cough: minimal assoc with nasal drainage /flonase  Sleeping: able to lie flat SABA use: none  02: none  Rec No change in medications for now Continue flonase one twice daily - point it toward the ear on the same side  I will be referring you to [facility] seen  02/21/21:  Allergic rhinitis with conjunctivitis  -Environmental allergy testing is positive to grass pollen, weed pollen, tree pollen, molds, dust mite cat and dog.  -Allergen avoidance measures discussed/handouts provided -Recommend use of Zyrtec 10 mg daily.  Can take additional dose if necessary.  Other long-acting antihistamines including Xyzal and  Allegra that may be equally or more effective.  These are all over-the-counter options -Continue Singulair 10 mg nightly -trial dymista 1 spray each nostril twice a day.  This is a combination nasal spray with Flonase + Astelin (nasal antihistamine).  This helps with both nasal congestion and drainage.  If this is not covered by insurance then will prescribe the 2 sprays separately -For itchy watery eyes use olopatadine 0.2% 1 drop each eye daily as needed -allergen immunotherapy discussed today including protocol, benefits and risk.  Informational handout provided.  If interested in this therapuetic option you can check with your insurance carrier for coverage.      02/20/2023  ACUTE  ov/Kristy [name] office/Kristy [name]  re: cough x 2017 c/w uacs from allergic rhinitis  recurrent pattern  maint on zyrtec and singulair/ no nasal sprays  Does not remember allergy recs or whether tried dymista  / cough onset may have correlated with temporary d/c pepcid in pm assoc with noct "wheeze"  Chief Complaint  Patient presents with   Shortness of Breath  Dyspnea:  onset x 2 m worse assoc with cough > sensation of mucus stuck in chest (points to midline) > min mucoid/ assoc pnds nothing purulent/ subj wheeze esp hs / no better with abx bevespi prednisone /multiple providers and "nothing 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   Stop bevespi  Only use your albuterol as a rescue medication Omerazole (prilosec ) 40 mg   Take  30-60 min before first meal of the day and Pepcid (famotidine)  20 mg after supper until return to office  GERD diet reviewed, bed blocks rec For drainage / throat tickle try take CHLORPHENIRAMINE  4 mg  If not better by 02/24/23 we need to see you with all medications and inhalers     02/27/2023  f/u ov/Kristy [name] re: cough x 2017  maint on no inhalers    Chief Complaint  Patient presents with   Acute Visit    Productive cough persistent x 1 month.  Congestion mostly in throat.  Dyspnea:  walking fast  Cough: much worse with laughter/ certain smells / mucusvariably green - worse 1st thing in am  Sense of globus  Sleeping: still on a flat bed / freq awakening due to cough and noisy breathing.  SABA use: rarely  02: none  Rec For cough/ congestion >  Mucinex dm 1200  up to maximum of  1200 mg every 12 hours and use the flutter valve as much as you can    Augmentin 875 mg take one pill twice daily  X 10 days - Bed blocks 6-8" and take the omeprazole 40 mg Take 30-60 min before first meal of the day  My office will be contacting you by phone for referral to ENT  for globus sensation  Please schedule a follow up office visit in 4 weeks, sooner if needed  Late add  - FENO 02/27/2023  = 122 > rx qvar 40 2bid/ pred x 6 d> called in    03/25/2023  f/u ov/[place] office/Alessandro Griep re: *** maint on ***  No chief complaint on file.   Dyspnea:  *** Cough: *** Sleeping: ***   resp cc  SABA use: *** 02: ***  Lung cancer screening: ***   No obvious day to day or daytime variability or assoc excess/ purulent sputum or mucus plugs or hemoptysis or cp or chest tightness, subjective wheeze or overt sinus or hb symptoms.    Also denies any obvious fluctuation of symptoms with weather or environmental changes or other aggravating or alleviating factors except as outlined above   No unusual exposure hx or h/o childhood pna/ asthma or knowledge of premature birth.  Current Allergies, Complete Past Medical History, Past Surgical History, Family History, and Social History were reviewed in [employer] record.  ROS  The following are not active complaints unless bolded Hoarseness, sore throat, dysphagia, dental problems, itching, sneezing,  nasal congestion or discharge of excess mucus or purulent secretions, ear ache,   fever, chills, sweats, unintended wt loss or wt gain, classically pleuritic or exertional cp,  orthopnea pnd or arm/hand swelling  or leg swelling, presyncope, palpitations, abdominal pain, anorexia, nausea, vomiting, diarrhea  or change in bowel habits or change in bladder habits, change in stools or change in urine, dysuria, hematuria,  rash, arthralgias, visual complaints, headache, numbness, weakness or ataxia or problems with walking or coordination,  change in mood or  memory.        No outpatient medications have  been marked as taking for the 03/25/23 encounter (Appointment) with Nyoka Cowden, MD.                  Objective:   Physical Exam   Wts  03/25/2023        ***  02/20/2023         230  12/05/2020       221  11/07/2020       222 08/29/2020       219  08/02/2020       217  10/02/2016      213   09/04/16 209 lb (94.8 kg)  08/27/16 216 lb (98 kg)  08/20/16 214 lb 9.6 oz (97.3 kg)    Vital signs reviewed  03/25/2023  - Note at rest 02 sats  ***% on ***   General appearance:    ***          Assessment:

## 2023-03-25 ENCOUNTER — Ambulatory Visit (INDEPENDENT_AMBULATORY_CARE_PROVIDER_SITE_OTHER): Payer: 59 | Admitting: Internal Medicine

## 2023-03-25 ENCOUNTER — Encounter: Payer: Self-pay | Admitting: Internal Medicine

## 2023-03-25 VITALS — BP 128/88 | HR 75 | Ht 65.0 in | Wt 225.0 lb

## 2023-03-25 DIAGNOSIS — R058 Other specified cough: Secondary | ICD-10-CM

## 2023-03-25 NOTE — Patient Instructions (Addendum)
Continue qvar 40  Take 2 puffs first thing in am and then another 2 puffs about 12 hours later.    Only use your albuterol as a rescue medication to be used if you can't catch your breath by resting or doing a relaxed purse lip breathing pattern.  - The less you use it, the better it will work when you need it. - Ok to use up to 2 puffs  every 4 hours if you must but call for immediate appointment if use goes up over your usual need - Don't leave home without it !!  (think of it like the spare tire for your car)   Be sure to tell ENT doctor about your sinuses when this problem  started and that you have RA (which can affect the vocal cords)   Please schedule a follow up visit in 3 months but call sooner if needed  with all medications /inhalers/ solutions in hand so we can verify exactly what you are taking. This includes all medications from all doctors and over the counters

## 2023-03-26 NOTE — Assessment & Plan Note (Signed)
Onset around 2000 - Kozlow eval 2016 did not rec shots for dust/ ragweed allergy identified  -Trial of max gerd rx/ singulair 09/04/2016 >> improved 10/02/2016 > changed to prn gerd rx and ok until 04/2021 with sob / "congestion"  - placed back on daily singulair, ppi ac in and pepcid 20 in pm and d/c all inhalers  - Allergy profile 08/02/2020 >  Eos 0.1 /  IgE  2 - sinus CT 08/08/2020  No evidence of inflammatory sinus disease. - Flare mid April 2022 p dog exp > cyclical cough rx / no inhalers > resolved as of 12/05/2020  - 12/05/2020 referred back to Dr Lucie Leather seen 02/21/21 rec add dymista  - 12/2022 flare ? Related to d/c pm Pepcid and not taking am PPI ac > 02/20/2023 rx cyclical cough regimen with tramadol > improved somewhat 02/27/2023  - FENO 02/27/2023  = 122 > rx qvar 40 2bid/ pred x 6 d - 03/25/2023  After extensive coaching inhaler device,  effectiveness =    75% (short Ti)   All goals of chronic asthma control met including optimal function and elimination of symptoms with minimal need for rescue therapy.  Contingencies discussed in full including contacting this office immediately if not controlling the symptoms using the rule of two's.    F/u with ent re VCS and rule out sinusitis/ pnds as well as CA from RA (cricoarytenoiditis)    No change ICS for now/ f/u in 3 m sooner prn          Each maintenance medication was reviewed in detail including emphasizing most importantly the difference between maintenance and prns and under what circumstances the prns are to be triggered using an action plan format where appropriate.  Total time for H and P, chart review, counseling, reviewing hfa device(s) and generating customized AVS unique to this office visit / same day charting = 32 min

## 2023-03-31 ENCOUNTER — Telehealth: Payer: Self-pay

## 2023-03-31 ENCOUNTER — Other Ambulatory Visit (HOSPITAL_COMMUNITY): Payer: Self-pay

## 2023-03-31 NOTE — Telephone Encounter (Signed)
Pharmacy Patient Advocate Encounter   Received notification from CoverMyMeds that prior authorization for REPATHA is required/requested.   Insurance verification completed.   The patient is insured through [facility] .   Per test claim: PA required; PA submitted to [facility] via CoverMyMeds Key/confirmation #/EOC [id number] Status is pending

## 2023-04-01 ENCOUNTER — Other Ambulatory Visit (HOSPITAL_COMMUNITY): Payer: Self-pay

## 2023-04-01 NOTE — Telephone Encounter (Signed)
Pharmacy Patient Advocate Encounter  Received notification from [facility] that Prior Authorization for REPATHA has been APPROVED from 03/31/23 to 03/30/24. Ran test claim, Copay is $0. This test claim was processed through [facility]- copay amounts may vary at other pharmacies due to pharmacy/plan contracts, or as the patient moves through the different stages of their insurance plan.

## 2023-04-14 ENCOUNTER — Other Ambulatory Visit: Payer: Self-pay | Admitting: Cardiology

## 2023-05-05 NOTE — Progress Notes (Deleted)
Tysinger, Kermit Balo, PA-C   No chief complaint on file.   HPI:      [name] is a 52 y.o. [id number] whose LMP was No LMP recorded (lmp unknown). Patient is postmenopausal., presents today for ***  She has a hx of BV and AV in past.   Patient Active Problem List   Diagnosis Date Noted   Routine general medical examination at a health care facility 12/03/2022   Need for Td vaccine 11/27/2021   Hyperlipidemia 09/25/2021   Hot flashes 06/12/2021   Screening for diabetes mellitus 05/02/2021   Screening for thyroid disorder 05/02/2021   Perimenopausal vasomotor symptoms 04/24/2021   BMI 37.0-37.9, adult 01/26/2021   Coronary artery disease involving native heart without angina pectoris 01/26/2021   Tightness in chest 01/26/2021   Mass of left axilla 01/26/2021   CIN I (cervical intraepithelial neoplasia I) 11/07/2020   ASCUS with positive high risk HPV cervical 04/25/2020   Reactive airway disease 01/19/2020   Angular cheilosis 01/19/2020   Chronic constipation 06/28/2019   Flatulence 06/28/2019   Gastroesophageal reflux disease 06/28/2019   Polyuria 04/03/2018   Impaired fasting blood sugar 04/03/2018   History of tubal ligation 04/03/2018   Increased risk of breast cancer    BRCA negative    PVC (premature ventricular contraction) 03/05/2017   Family history of premature CAD 02/06/2017   Family history of breast cancer 11/25/2016   Upper airway cough syndrome with component of cough variant asthma 09/04/2016   DOE (dyspnea on exertion) 09/04/2016   Female sterility 09/14/2015   Menorrhagia with regular cycle 09/14/2015   Uterine leiomyoma 09/14/2015   Ovarian cyst, right 09/14/2015   Chronic vaginitis 09/14/2015   Obesity with serious comorbidity 06/28/2015   Vitamin D deficiency 06/28/2015   Anemia, iron deficiency 06/28/2015   Microscopic hematuria 06/28/2015   Allergic rhinitis 03/31/2013    Past Surgical History:  Procedure Laterality Date   BREAST  BIOPSY Right 1980s   neg   CESAREAN SECTION     DILITATION & CURRETTAGE/HYSTROSCOPY WITH NOVASURE ABLATION N/A 09/14/2015   Procedure: DILATATION & CURETTAGE/HYSTEROSCOPY WITH NOVASURE ABLATION;  Surgeon: Nadara Mustard, MD;  Location: [facility] OR[facility];  Service: Gynecology;  Laterality: N/A;   IUD REMOVAL N/A 09/14/2015   Procedure: INTRAUTERINE DEVICE (IUD) REMOVAL;  Surgeon: Nadara Mustard, MD;  Location: [facility] OR[facility];  Service: Gynecology;  Laterality: N/A;   LAPAROSCOPIC BILATERAL SALPINGECTOMY Bilateral 09/14/2015   Procedure: LAPAROSCOPIC BILATERAL SALPINGECTOMY;  Surgeon: Nadara Mustard, MD;  Location: [facility] OR[facility];  Service: Gynecology;  Laterality: Bilateral;   TUBAL LIGATION Bilateral 09/14/2015   Procedure: BILATERAL TUBAL LIGATION;  Surgeon: Nadara Mustard, MD;  Location: [facility] OR[facility];  Service: Gynecology;  Laterality: Bilateral;   VULVAR LESION REMOVAL N/A 09/14/2015   Procedure: VULVAR LESION;  Surgeon: Nadara Mustard, MD;  Location: [facility] OR[facility];  Service: Gynecology;  Laterality: N/A;   WISDOM TOOTH EXTRACTION      Family History  Problem Relation Age of Onset   Heart disease Mother 80       stent, CAD   Breast cancer Mother 73       with recurrence about 61   Cancer Father        esophageal cancer   Heart disease Father 38       died of MI   Alcohol abuse Father    Esophageal cancer Father    Hypertension Sister    Hypertension Brother    CAD Brother  MI at age 48   Hypertension Brother    Breast cancer Maternal Grandmother 63   Diabetes Neg Hx    Stroke Neg Hx    Colon cancer Neg Hx    Colon polyps Neg Hx    Stomach cancer Neg Hx    Rectal cancer Neg Hx     Social History   Socioeconomic History   Marital status: Married    Spouse name: Not on file   Number of children: Not on file   Years of education: Not on file   Highest education level: Not on file  Occupational History   Not on file  Tobacco Use   Smoking status: Never   Smokeless tobacco: Never  Vaping Use    Vaping status: Never Used  Substance and Sexual Activity   Alcohol use: Not Currently    Comment: maybe one drink a year   Drug use: Never   Sexual activity: Not Currently    Birth control/protection: Surgical    Comment: reports ablation and partner had vasectomy.   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 Strain: Not on file  Food Insecurity: Not on file  Transportation Needs: Not on file  Physical Activity: Not on file  Stress: Not on file  Social Connections: Unknown (03/22/2023)   [facility]   Social Network    Social Network: Not on file  Intimate Partner Violence: Unknown (03/22/2023)   Received from [facility]   HITS    Physically Hurt: Not on file    Insult or Talk Down To: Not on file    Threaten Physical Harm: Not on file    Scream or Curse: Not on file    Outpatient Medications Prior to Visit  Medication Sig Dispense Refill   albuterol (VENTOLIN HFA) 108 (90 Base) MCG/ACT inhaler Inhale 2 puffs into the lungs every 6 (six) hours as needed for wheezing or shortness of breath. 18 g 1   aspirin EC 81 MG tablet Take 1 tablet (81 mg total) by mouth daily. Swallow whole. 90 tablet 3   Azelaic Acid 15 % gel Apply topically at bedtime.     Azelaic Acid 15 % gel Apply 1 Application topically 2 (two) times daily.     beclomethasone (QVAR) 40 MCG/ACT inhaler Take 2 puffs first thing in am and then another 2 puffs about 12 hours later. 1 each 11   cetirizine (ZYRTEC) 10 MG tablet Take 1 tablet (10 mg total) by mouth daily. 90 tablet 3   Evolocumab (REPATHA SURECLICK) 140 MG/ML SOAJ INJECT 140 MG INTO THE SKIN EVERY 14 (FOURTEEN) DAYS. 6 mL 3   famotidine (PEPCID) 20 MG tablet TAKE ONE TABLET BY MOUTH AFTER SUPPER 90 tablet 3    Fluocinolone Acetonide Scalp 0.01 % OIL Apply 1 Application topically as directed. APPLY DIRECTLY TO SCALP DAILY WHEN ITCHING THE 1-2 TIMES WEEKLY FOR MAINTENANCE     folic acid (FOLVITE) 1 MG tablet Take 1 mg by mouth daily.     hydroxychloroquine (PLAQUENIL) 200 MG tablet Take 400 mg by mouth daily.     ketoconazole (NIZORAL) 2 % cream Apply 1 Application topically 2 (two) times daily as needed for irritation.  ketoconazole (NIZORAL) 2 % shampoo Apply 1 Application topically 2 (two) times a week.     methotrexate (RHEUMATREX) 2.5 MG tablet Take 10 mg by mouth once a week.     montelukast (SINGULAIR) 10 MG tablet TAKE 1 TABLET BY MOUTH EVERYDAY AT BEDTIME 90 tablet 3   sulconazole nitrate 1 % external solution Apply 1 Application topically daily.     traMADol (ULTRAM) 50 MG tablet Take 1 tablet (50 mg total) by mouth every 4 (four) hours as needed (cough). 40 tablet 0   triamcinolone ointment (KENALOG) 0.1 % Apply 1 Application topically 2 (two) times daily.     No facility-administered medications prior to visit.      ROS:  Review of Systems BREAST: No symptoms   OBJECTIVE:   Vitals:  LMP  (LMP Unknown)   Physical Exam  Results: No results found for this or any previous visit (from the past 24 hour(s)).   Assessment/Plan: No diagnosis found.    No orders of the defined types were placed in this encounter.     No follow-ups on file.  Fey Coghill B. Add Dinapoli, PA-C 05/05/2023 5:17 PM

## 2023-05-06 ENCOUNTER — Ambulatory Visit: Payer: 59 | Admitting: Obstetrics and Gynecology

## 2023-05-06 ENCOUNTER — Encounter (INDEPENDENT_AMBULATORY_CARE_PROVIDER_SITE_OTHER): Payer: Self-pay

## 2023-05-06 ENCOUNTER — Ambulatory Visit (INDEPENDENT_AMBULATORY_CARE_PROVIDER_SITE_OTHER): Payer: 59

## 2023-05-06 VITALS — Ht 65.0 in | Wt 218.0 lb

## 2023-05-06 DIAGNOSIS — R0989 Other specified symptoms and signs involving the circulatory and respiratory systems: Secondary | ICD-10-CM | POA: Diagnosis not present

## 2023-05-06 DIAGNOSIS — R09A2 Foreign body sensation, throat: Secondary | ICD-10-CM

## 2023-05-06 DIAGNOSIS — J328 Other chronic sinusitis: Secondary | ICD-10-CM

## 2023-05-06 DIAGNOSIS — R0982 Postnasal drip: Secondary | ICD-10-CM

## 2023-05-06 DIAGNOSIS — K219 Gastro-esophageal reflux disease without esophagitis: Secondary | ICD-10-CM

## 2023-05-06 DIAGNOSIS — R053 Chronic cough: Secondary | ICD-10-CM

## 2023-05-06 DIAGNOSIS — R0981 Nasal congestion: Secondary | ICD-10-CM

## 2023-05-06 MED ORDER — AMOXICILLIN-POT CLAVULANATE 875-125 MG PO TABS: 1.0000 | ORAL_TABLET | Freq: Two times a day (BID) | ORAL | 0 refills | Status: DC

## 2023-05-06 MED ORDER — AZELASTINE HCL 0.1 % NA SOLN: 2.0000 | Freq: Two times a day (BID) | NASAL | 12 refills | Status: DC

## 2023-05-06 MED ORDER — FLUTICASONE PROPIONATE 50 MCG/ACT NA SUSP
2.0000 | Freq: Every day | NASAL | 6 refills | Status: DC
Start: 2023-05-06 — End: 2023-12-25

## 2023-05-06 MED ORDER — PREDNISONE 10 MG PO TABS: 10.0000 mg | ORAL_TABLET | Freq: Every day | ORAL | 0 refills | Status: AC

## 2023-05-06 NOTE — Patient Instructions (Addendum)
Augmentin 875 mg PO twice daily x7d; take with food, take probiotic or yogurt with it Take Prednisone by mouth (PO) 30mg  x4 days (3 pills in morning), then 20mg  x4 days (2 pills), then 10mg  x 4 days (1 pill), then stop. Use flonase nasal spray 2 sprays twice daily in each nostril Use zyrtec daily Astelin spray - two sprays each nostril as needed for congestion I have ordered an imaging study for you to complete prior to your next visit. Please call [facility] Scheduling at [phone] to schedule your imaging if you have not received a call within 24 hours. If you are unable to complete your imaging study prior to your next scheduled visit please call our office to let us know.     Lloyd Huger Med Nasal Saline Rinse   - start nasal saline rinses with NeilMed Bottle available over the counter    Nasal Saline Irrigation instructions: If you choose to make your own salt water solution, You will need: Salt (kosher, canning, or pickling salt) Baking soda Nasal irrigation bottle (i.e. Lloyd Huger Med Sinus Rinse) Measuring spoon ( teaspoon) Distilled / boiled water   Mix solution Mix 1 teaspoon of salt, 1/2 teaspoon of baking soda and 1 cup of water into irrigation bottle ** May use saline packet instead of homemade recipe for this step if you prefer If medicine was prescribed to be mixed with solution, place this into bottle Examples 2 inches of 2% mupirocin ointment Budesonide solution Position your head: Lean over sink (about 45 degrees) Rotate head (about 45 degrees) so that one nostril is above the other Irrigate Insert tip of irrigation bottle into upper nostril so it forms a comfortable seal Irrigate while breathing through your mouth May remove the straw from the bottle in order to irrigate the entire solution (important if medicine was added) Exhale through nose when finished and blow nose as necessary  Repeat on opposite side with other 1/2 of solution (120 mL) or remake  solution if all 240 mL was used on first side Wash irrigation bottle regularly, replace every 3 months

## 2023-05-06 NOTE — Progress Notes (Unsigned)
Dear Dr. Sherene Sires, Here is my assessment for our mutual patient, Kristy [name]. Thank you for allowing me the opportunity to care for your patient. Please do not hesitate to contact me should you have any other questions. Sincerely, Dr. Jovita Kussmaul  Otolaryngology Clinic Note Referring provider: Dr. Sherene Sires HPI:  Kristy [name] is a 52 y.o. female kindly referred by Dr. Sherene Sires for evaluation of chronic congestion. Has been followed by Dr. Sherene Sires since 2018. She has multiple things that are troublesome including chest congestion (not nasal), post nasal drip (some), and wheezing. Most symptoms occur at night - when she lays down she has some trouble breathing. She recently had episode of bronchitis (cough, post nasal drip, throat feels "congested") which required multiple rounds of antibiotics, and steroids which helped significantly. No significant cough, but does some throat clearing. Not using mucinex. Tramadol trial prior with benefit.  Cough does not have significant triggers otherwise except for smoke and forceful inhalation (like laughing). No temperature or perfume sensitivity. She is currently doing inhalers as needed. She is currently using Pepcid and PPI. Not using any sprays. She has used dymista and flonase in the past, but only when congested. She has not used nasal sprays in many ears.  Allergy testing was prior done (2022) - had exacerbations several years ago but now under control and not having common AR symptoms.  No change in voice or swallowing.  No prior sinus surgeries.   Main complaint is globus sensation - "clump of coal" that's not moving. Did just lose her mom and all her symptoms occurred afterwards.  Denies frequent sinonasal infection symptoms.   Onset around 2000 - [facility] eval 2016 did not rec shots for dust/ ragweed allergy identified  -Trial of max gerd rx/ singulair 09/04/2016 >> improved 10/02/2016 > changed to prn gerd rx and ok until 04/2021 with sob / "congestion"  -  placed back on daily singulair, ppi ac in and pepcid 20 in pm and d/c all inhalers  - Allergy profile 08/02/2020 >  Eos 0.1 /  IgE  2 - sinus CT 08/08/2020  No evidence of inflammatory sinus disease. - Flare mid April 2022 p dog exp > cyclical cough rx / no inhalers > resolved as of 12/05/2020  - 12/05/2020 referred back to Dr Lucie Leather seen 02/21/21 rec add dymista  - 12/2022 flare ? Related to d/c pm Pepcid and not taking am PPI ac > 02/20/2023 rx cyclical cough regimen with tramadol > improved somewhat 02/27/2023  - FENO 02/27/2023  = 122 > rx qvar 40 2bid/ pred x 6 d - 03/25/2023  After extensive coaching inhaler device,  effectiveness =    75% (short Ti)      Dr. Delorse Lek notes: She states the sneezing and congestion are her biggest symptoms.  She also reports nasal drainage and throat clearing.  Symptoms are year-round.  She has tried chlorpheniramine that she takes nightly and feels it helps somewhat.  She is on singulair daily for past 3 months.  She will also take zyrtec as needed and takes about 2-3 times a week.  She does feel zyrtec helps.  Has not tried xyzal or allegra before.  She did use flonase with the dog exposure above and states it did help with congestion.  She also used over-the-counter eye drop as well with dog exposure.  She states she has had reactions to cats before but this is first time she ever had symptoms with dog exposure.    She has seen  Dr. Sherene Sires in pulmonary and likely due to upper airway cough syndrome.  However appears she was referred for congestion and shortness of breath. She can have shortness of breath with activities like walking up stairs.  She states she can wheeze when allergy symptoms are worse.  She denies a diagnosis of asthma.     -Allergen avoidance measures discussed/handouts provided -Recommend use of Zyrtec 10 mg daily.  Can take additional dose if necessary.  Other long-acting antihistamines including Xyzal and Allegra that may be equally or more effective.   These are all over-the-counter options -Continue Singulair 10 mg nightly -trial dymista 1 spray each nostril twice a day.  This is a combination nasal spray with Flonase + Astelin (nasal antihistamine).  This helps with both nasal congestion and drainage.  If this is not covered by insurance then will prescribe the 2 sprays separately -For itchy watery eyes use olopatadine 0.2% 1 drop each eye daily as needed -allergen immunotherapy discussed today including protocol, benefits and risk.  Informational handout provided.  If interested in this therapuetic option you can check with your insurance carrier for coverage.  Let us know if you would like to proceed with this option.     Reactive airway, allergen driven -have access to albuterol inhaler 2 puffs every 4-6 hours as needed for cough/wheeze/shortness of breath/chest tightness.  May use 15-20 minutes prior to activity.   Monitor frequency of use.    H&N Surgery: *** Personal or FHx of bleeding dz or anesthesia difficulty: no *** PMHx: RA on DMARDs; GERD; HLD  Independent Review of Additional Tests or Records:  CT Face limited 1/25: Sinuses clear  Prior allergy testing: Allergy testing: Environmental allergy skin prick testing is positive to [place], [place], [place], short and giant ragweed, mugwort, hickory, oak, pecan, both dust mites, cat, dog, Alternaria, Helminthosporium, Curvularia and epicoccum nigrum Select food allergy skin prick testing is negative to celery, cabbage and green pea Allergy testing results were read and interpreted by provider, documented by clinical staff.   PMH/Meds/All/SocHx/FamHx/ROS:   Past Medical History:  Diagnosis Date   Allergy    BRCA negative 11/2012, 4/19   BRCA neg 2014; MyRisk update neg except POLE VUS 4/19   Dysrhythmia    a. 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 pylori  infection 10/2010   H/O exercise stress test 2018   Increased risk of breast cancer 11/2012; 4/19   5/14 IBIS=25.6%; 4/19 IBIS=25.8%   Iron deficiency anemia, unspecified    history of, normal hemoglobin 06/2019   Obesity    Seasonal allergies    ragweed, tree pollen, mild and dust per testing 08/2012   Tubular adenoma of colon 2022   Unspecified vitamin D deficiency    Vitamin D deficiency 2017     Past Surgical History:  Procedure Laterality Date   BREAST BIOPSY Right 1980s   neg   CESAREAN SECTION     DILITATION & CURRETTAGE/HYSTROSCOPY WITH NOVASURE ABLATION N/A 09/14/2015   Procedure: DILATATION & CURETTAGE/HYSTEROSCOPY WITH NOVASURE ABLATION;  Surgeon: Nadara Mustard, MD;  Location: [facility];  Service: Gynecology;  Laterality: N/A;   IUD REMOVAL N/A 09/14/2015   Procedure: INTRAUTERINE DEVICE (IUD) REMOVAL;  Surgeon: Nadara Mustard, MD;  Location: [facility];  Service: Gynecology;  Laterality: N/A;   LAPAROSCOPIC BILATERAL SALPINGECTOMY Bilateral 09/14/2015   Procedure: LAPAROSCOPIC BILATERAL SALPINGECTOMY;  Surgeon: Nadara Mustard, MD;  Location: [facility];  Service: Gynecology;  Laterality: Bilateral;   TUBAL LIGATION Bilateral 09/14/2015   Procedure: BILATERAL TUBAL LIGATION;  Surgeon: Nadara Mustard, MD;  Location: [facility];  Service: Gynecology;  Laterality: Bilateral;   VULVAR LESION REMOVAL N/A 09/14/2015   Procedure: VULVAR LESION;  Surgeon: Nadara Mustard, MD;  Location: [facility];  Service: Gynecology;  Laterality: N/A;   WISDOM TOOTH EXTRACTION      Family History  Problem Relation Age of Onset   Heart disease Mother 32       stent, CAD   Breast cancer Mother 50       with recurrence about 29   Cancer Father        esophageal cancer   Heart disease Father 12       died of MI   Alcohol abuse Father    Esophageal cancer Father    Hypertension Sister    Hypertension Brother    CAD Brother        MI at age 42   Hypertension Brother    Breast cancer Maternal  Grandmother 69   Diabetes Neg Hx    Stroke Neg Hx    Colon cancer Neg Hx    Colon polyps Neg Hx    Stomach cancer Neg Hx    Rectal cancer Neg Hx      Social Connections: Unknown (03/22/2023)   Received from [employer]   Social Network    Social Network: Not on file    Tobacco: ***. Alcohol: ***. Occupation: ***. Lives in *** with ***.   Current Outpatient Medications:    albuterol (VENTOLIN HFA) 108 (90 Base) MCG/ACT inhaler, Inhale 2 puffs into the lungs every 6 (six) hours as needed for wheezing or shortness of breath., Disp: 18 g, Rfl: 1   aspirin EC 81 MG tablet, Take 1 tablet (81 mg total) by mouth daily. Swallow whole., Disp: 90 tablet, Rfl: 3   Azelaic Acid 15 % gel, Apply topically at bedtime., Disp: , Rfl:    Azelaic Acid 15 % gel, Apply 1 Application topically 2 (two) times daily., Disp: , Rfl:    beclomethasone (QVAR) 40 MCG/ACT inhaler, Take 2 puffs first thing in am and then another 2 puffs about 12 hours later., Disp: 1 each, Rfl: 11   cetirizine (ZYRTEC) 10 MG tablet, Take 1 tablet (10 mg total) by mouth daily., Disp: 90 tablet, Rfl: 3   Evolocumab (REPATHA SURECLICK) 140 MG/ML SOAJ, INJECT 140 MG INTO THE SKIN EVERY 14 (FOURTEEN) DAYS., Disp: 6 mL, Rfl: 3   famotidine (PEPCID) 20 MG tablet, TAKE ONE TABLET BY MOUTH AFTER SUPPER, Disp: 90 tablet, Rfl: 3   Fluocinolone Acetonide Scalp 0.01 % OIL, Apply 1 Application topically as directed. APPLY DIRECTLY TO SCALP DAILY WHEN ITCHING THE 1-2 TIMES WEEKLY FOR MAINTENANCE, Disp: , Rfl:    folic acid (FOLVITE) 1 MG tablet, Take 1 mg by mouth daily., Disp: , Rfl:    hydroxychloroquine (PLAQUENIL) 200 MG tablet, Take 400 mg by mouth daily., Disp: , Rfl:    ketoconazole (NIZORAL) 2 % cream, Apply 1 Application topically 2 (two) times daily as needed for irritation., Disp: , Rfl:    ketoconazole (NIZORAL) 2 % shampoo, Apply 1 Application topically 2 (two) times a week., Disp: , Rfl:    methotrexate (RHEUMATREX) 2.5 MG tablet, Take  10 mg by mouth once a week., Disp: , Rfl:    montelukast (SINGULAIR) 10 MG tablet,  TAKE 1 TABLET BY MOUTH EVERYDAY AT BEDTIME, Disp: 90 tablet, Rfl: 3   sulconazole nitrate 1 % external solution, Apply 1 Application topically daily., Disp: , Rfl:    traMADol (ULTRAM) 50 MG tablet, Take 1 tablet (50 mg total) by mouth every 4 (four) hours as needed (cough)., Disp: 40 tablet, Rfl: 0   triamcinolone ointment (KENALOG) 0.1 %, Apply 1 Application topically 2 (two) times daily., Disp: , Rfl:    Physical Exam:   Ht 5\' 5"  (1.651 m)   Wt 218 lb (98.9 kg)   LMP  (LMP Unknown)   BMI 36.28 kg/m  ***  Salient findings:  CN II-XII intact *** Bilateral EAC clear and TM intact with well pneumatized middle ear spaces Weber 512: *** Rinne 512: AC > BC b/l *** Rine 1024: AC > BC b/l *** Anterior rhinoscopy: Septum ***; bilateral inferior turbinates with *** No lesions of oral cavity/oropharynx; dentition *** No obviously palpable neck masses/lymphadenopathy/thyromegaly No respiratory distress or stridor***  Procedures:  PROCEDURE: Bilateral Diagnostic Rigid Nasal Endoscopy Pre-procedure diagnosis: Concern for *** Post-procedure diagnosis: same Indication: See pre-procedure diagnosis and physical exam above Complications: None apparent EBL: *** mL Anesthesia: Lidocaine 4% and topical decongestant was topically sprayed in each nasal cavity  Description of Procedure:  Patient was identified. A rigid 30 degree endoscope was utilized to evaluate the sinonasal cavities, mucosa, sinus ostia and turbinates and septum.  Overall, signs of mucosal inflammation are*** noted.  Also noted are ***.  No mucopurulence, polyps, or masses noted.   Right Middle meatus: *** Right SE Recess: *** Left MM: *** Left SE Recess: *** Photodocumentation was obtained.  CPT CODE -- 16109 - Mod 25   Impression & Plans:  Ruweyda Kaas is a 53 y.o. female with ***   - f/u ***   Thank you for allowing me the  opportunity to care for your patient. Please do not hesitate to contact me should you have any other questions.  Sincerely, Jovita Kussmaul, MD Otolarynoglogist (ENT), [facility] Phone: [phone] Fax: [phone]  05/06/2023, 3:07 PM

## 2023-05-22 ENCOUNTER — Other Ambulatory Visit: Payer: Self-pay | Admitting: Obstetrics and Gynecology

## 2023-05-22 DIAGNOSIS — Z1231 Encounter for screening mammogram for malignant neoplasm of breast: Secondary | ICD-10-CM

## 2023-05-28 ENCOUNTER — Ambulatory Visit (HOSPITAL_COMMUNITY)
Admission: RE | Admit: 2023-05-28 | Discharge: 2023-05-28 | Disposition: A | Discharge status: Home / Self Care | Payer: 59 | Source: Ambulatory Visit | Attending: Otolaryngology | Admitting: Otolaryngology

## 2023-05-28 DIAGNOSIS — J328 Other chronic sinusitis: Secondary | ICD-10-CM | POA: Diagnosis present

## 2023-06-04 ENCOUNTER — Ambulatory Visit
Admission: RE | Admit: 2023-06-04 | Discharge: 2023-06-04 | Disposition: A | Discharge status: Home / Self Care | Payer: 59 | Source: Ambulatory Visit | Attending: Obstetrics and Gynecology | Admitting: Obstetrics and Gynecology

## 2023-06-04 DIAGNOSIS — Z1231 Encounter for screening mammogram for malignant neoplasm of breast: Secondary | ICD-10-CM | POA: Insufficient documentation

## 2023-06-17 ENCOUNTER — Ambulatory Visit (INDEPENDENT_AMBULATORY_CARE_PROVIDER_SITE_OTHER): Payer: 59

## 2023-06-23 NOTE — Progress Notes (Unsigned)
Subjective:    Patient ID: Kristy [name], female   DOB: March 09, 1971    MRN: [id number]    Brief patient profile:  53 yobf never smoker works as Interior and spatial designer  with sinus issues around ? 2000 esp in fall better while she could get Tavist then 2016 eval by allergy  ? [facility] office pos dust/ ragweed didn't rec any shots and no f/u then variable sob assoc sense of congestion referred to pulmonary clinic 09/04/2016 by Kristy [name].  New dx of RA hand early 2024 >  methotrexate / plaquenil some better    History of Present Illness  09/04/2016 1st [facility] office visit/ Kristy [name]   Chief Complaint  Patient presents with   Pulmonary Consult    Referred by Dr. Aleen [name].  Pt c/o SOB for the past several months. She states also having some chest congestion. She has had cough x 2 wks- non prod and sometimes to the point of vomitting. Sometimes cough wakes her up at night.  She states she gets completely out of breath just walking one flight of stairs.   indolent onset 12/2015 of chest "congestion" and need to clear throat  present every day since onset onset   and unable to consistently lie flat due to choking sensation assoc hoarseness and dysphagia/  but s excess/ purulent sputum or mucus plugs. Cough so bad vomits / worse with albuterol  Better since started prilosec but not taking consistently Last pred x 6 days not recently ? Helped some  rec Start singulair 10 mg every evening  Change Omeprazole 40 mg   Take  30-60 min before first meal of the day and Pepcid (famotidine)  20 mg one @  bedtime until return to office - this is the best way to tell whether stomach acid is contributing to your problem.  GERD  Diet     02/20/2023  ACUTE  ov/[place] office/Kristy [name] re: cough x 2017 c/w uacs from allergic rhinitis  recurrent pattern  maint on zyrtec and singulair/ no nasal sprays  Does not remember allergy recs or whether tried dymista  / cough onset may have correlated with temporary d/c pepcid in  pm assoc with noct "wheeze"  Chief Complaint  Patient presents with   Shortness of Breath  Dyspnea:  onset x 2 m worse assoc with cough > sensation of mucus stuck in chest (points to midline) > min mucoid/ assoc pnds nothing purulent/ subj wheeze esp hs / no better with abx bevespi prednisone /multiple providers and "nothing 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   Stop bevespi  Only use your albuterol as a rescue medication Omerazole (prilosec ) 40 mg   Take  30-60 min before first meal of the day and Pepcid (famotidine)  20 mg after supper until return to office  GERD diet reviewed, bed blocks rec For drainage / throat tickle try take CHLORPHENIRAMINE  4 mg  If not better by 02/24/23 we need to see you with all medications and inhalers     02/27/2023  f/u  ov/Kristy [name] re: cough x 2017  maint on no inhalers    Chief Complaint  Patient presents with   Acute Visit    Productive cough persistent x 1 month.  Congestion mostly in throat.  Dyspnea:  walking fast  Cough: much worse with laughter/ certain smells / mucusvariably green - worse 1st thing in am  Sense of globus  Sleeping: still on a flat bed / freq awakening due to cough and noisy breathing.  SABA use: rarely  02: none  Rec For cough/ congestion >  Mucinex dm 1200  up to maximum of  1200 mg every 12 hours and use the flutter valve as much as you can   Augmentin 875 mg take one pill twice daily  X 10 days - Bed blocks 6-8" and take the omeprazole 40 mg Take 30-60 min before first meal of the day  My office will be contacting you by phone for referral to ENT  for globus sensation   Please schedule a follow up office visit in 4 weeks, sooner if needed  Late add  - FENO 02/27/2023  = 122 > rx qvar 40 2bid/ pred x 6 d > called in    03/25/2023  f/u ov/[place] office/Kristy [name] re: recurrent cough pattern since 2017  maint on qvar 40 2bid   Chief Complaint  Patient presents with   Follow-up    Medication did help still having wheezing, when urgent care , given zpac and given medication acid reflux      Dyspnea:  walking up to 30 min daily some hills  Cough: slt yellow assoc with nasal congetion  Sleeping: 45  degrees some better  on day 3 zmax      SABA use: rarely  02: none   Rec Continue qvar 40  Take 2 puffs first thing in am and then another 2 puffs about 12 hours later.  Only use your albuterol as a rescue medication Be sure to tell ENT doctor about your sinuses when this problem  started and that you have RA (which can affect the vocal cords)  Please schedule a follow up visit in 3 months but call sooner if needed  with all medications /inhalers/ solutions in hand    06/24/2023  f/u ov/[place] office/Kristy [name] re: *** maint on *** did *** bring meds  No chief complaint on file.   Dyspnea:  *** Cough: *** Sleeping: ***   resp cc  SABA use: *** 02: ***  Lung cancer screening: ***   No obvious day to day or daytime variability or assoc excess/ purulent sputum or mucus plugs or hemoptysis or cp or chest tightness, subjective wheeze or overt sinus or hb symptoms.    Also denies any obvious fluctuation of symptoms with weather or environmental changes or other aggravating or alleviating factors except as outlined above   No unusual exposure hx or h/o childhood pna/ asthma or knowledge of premature birth.  Current Allergies, Complete Past Medical History, Past Surgical History, Family History, and Social History were reviewed in [employer] record.  ROS  The following are not active complaints unless bolded Hoarseness, sore throat,  dysphagia, dental problems, itching, sneezing,  nasal congestion or discharge of excess mucus or purulent secretions, ear ache,   fever, chills, sweats, unintended wt loss or wt gain, classically pleuritic or exertional cp,  orthopnea pnd or arm/hand swelling  or leg swelling, presyncope, palpitations, abdominal pain, anorexia, nausea, vomiting, diarrhea  or change in bowel habits or change  in bladder habits, change in stools or change in urine, dysuria, hematuria,  rash, arthralgias, visual complaints, headache, numbness, weakness or ataxia or problems with walking or coordination,  change in mood or  memory.        No outpatient medications have been marked as taking for the 06/24/23 encounter (Appointment) with Nyoka Cowden, MD.               Objective:   Physical Exam   Wts  06/24/2023      ***  03/25/2023       225  02/20/2023         230  12/05/2020       221  11/07/2020       222 08/29/2020       219  08/02/2020       217  10/02/2016      213   09/04/16 209 lb (94.8 kg)  08/27/16 216 lb (98 kg)  08/20/16 214 lb 9.6 oz (97.3 kg)    Vital signs reviewed  06/24/2023  - Note at rest 02 sats  ***% on ***   General appearance:    ***           Assessment:

## 2023-06-24 ENCOUNTER — Ambulatory Visit: Payer: 59 | Admitting: Internal Medicine

## 2023-06-24 NOTE — Progress Notes (Unsigned)
No chief complaint on file.   HPI:      [name] is a 52 y.o. [phone] who LMP was No LMP recorded (lmp unknown). Patient is postmenopausal., presents today for her annual examination. Her menses have been absent since novasure ablation. No BTB, no dysmen. Has 2 small leio on u/s (7/23 now showing 3 cm, increased from 1.4 cm-2.2 cm).  Having significant vasomotor sx., no relief with estroven or amberen. Would prefer non-hormonal tx; interested in veozah. Hx of elevated LFTs. NORMAL LFTS the past yr?!?!?!   Sex activity: sex active, contraception - vasectomy. Has pain in certain positions; dryness, improved with lubricants. 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 Tiburcio Pea 10/21; 6 mo repeat pap 4/22 was neg cells (no HPV DNA done). Repeat due Q3 yrs per [facility]. Hx of STDs: HPV on pap  She has a hx of BV and AV in past.   Last mammogram: 06/04/23  Results were: normal--routine follow-up in 12 months  There is a FH of breast cancer in her mom and MGM. There is no FH of ovarian cancer. The patient does do self-breast exams. Pt was BRCA neg 5/14. Myrisk neg 2019. IBIS=25.8%. Has not had scr breast MRI.    Tobacco use: The patient denies current or previous tobacco use. Alcohol WGN:FAOZ No drug use. Exercise: occas active  Colonoscopy: 2021 per pt with polyps, repeat due after 5 yrs per pt. (Can't find records in Care Everywhere)   She does get adequate calcium and Vitamin D in her diet.   Labs with PCP; seeing PCP soon for joint swelling/pain.  Pt with urinary frequency/nocturia with good flow, mostly at night. Drinks some caffeine during the day and drinking before bed. Hx of hematuria on UA in past with neg C&S.   Past Medical History:  Diagnosis Date   Allergy    BRCA negative 11/2012, 4/19   BRCA neg 2014; MyRisk update neg except POLE VUS 4/19    Dysrhythmia    a. 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 pylori infection 10/2010   H/O exercise stress test 2018   Increased risk of breast cancer 11/2012; 4/19   5/14 IBIS=25.6%; 4/19 IBIS=25.8%   Iron deficiency anemia, unspecified    history of, normal hemoglobin 06/2019   Obesity    Seasonal allergies    ragweed, tree pollen, mild and dust per testing 08/2012   Tubular adenoma of colon 2022   Unspecified vitamin D deficiency    Vitamin D deficiency 2017    Past Surgical History:  Procedure Laterality Date   BREAST BIOPSY Right 1980s   neg   CESAREAN SECTION     DILITATION & CURRETTAGE/HYSTROSCOPY WITH NOVASURE ABLATION N/A 09/14/2015   Procedure: DILATATION & CURETTAGE/HYSTEROSCOPY WITH NOVASURE ABLATION;  Surgeon: Nadara Mustard, MD;  Location: [facility];  Service: Gynecology;  Laterality: N/A;   IUD REMOVAL N/A 09/14/2015   Procedure: INTRAUTERINE DEVICE (IUD) REMOVAL;  Surgeon: Nadara Mustard, MD;  Location: [facility];  Service: Gynecology;  Laterality: N/A;   LAPAROSCOPIC BILATERAL SALPINGECTOMY Bilateral 09/14/2015   Procedure: LAPAROSCOPIC BILATERAL SALPINGECTOMY;  Surgeon: Nadara Mustard, MD;  Location: [facility];  Service: Gynecology;  Laterality: Bilateral;   TUBAL LIGATION  Bilateral 09/14/2015   Procedure: BILATERAL TUBAL LIGATION;  Surgeon: Nadara Mustard, MD;  Location: [facility];  Service: Gynecology;  Laterality: Bilateral;   VULVAR LESION REMOVAL N/A 09/14/2015   Procedure: VULVAR LESION;  Surgeon: Nadara Mustard, MD;  Location: [facility];  Service: Gynecology;  Laterality: N/A;   WISDOM TOOTH EXTRACTION      Family History  Problem Relation Age of Onset   Heart disease Mother 75       stent, CAD   Breast cancer Mother 10       with recurrence about 39   Cancer Father        esophageal cancer   Heart disease Father 36       died of MI   Alcohol  abuse Father    Esophageal cancer Father    Hypertension Sister    Hypertension Brother    CAD Brother        MI at age 81   Hypertension Brother    Breast cancer Maternal Grandmother 15   Diabetes Neg Hx    Stroke Neg Hx    Colon cancer Neg Hx    Colon polyps Neg Hx    Stomach cancer Neg Hx    Rectal cancer Neg Hx     Social History   Socioeconomic History   Marital status: Married    Spouse name: Not on file   Number of children: Not on file   Years of education: Not on file   Highest education level: Not on file  Occupational History   Not on file  Tobacco Use   Smoking status: Never   Smokeless tobacco: Never  Vaping Use   Vaping status: Never Used  Substance and Sexual Activity   Alcohol use: Not Currently    Comment: maybe one drink a year   Drug use: Never   Sexual activity: Not Currently    Birth control/protection: Surgical    Comment: reports ablation and partner had vasectomy.   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 [employer] Strain: Not on file  Food Insecurity: Not on file  Transportation Needs: Not on file  Physical Activity: Not on file  Stress: Not on file  Social Connections: Unknown (03/22/2023)   Received from [facility]   Social Network    Social Network: Not on file  Intimate Partner Violence: Unknown (03/22/2023)   Received from [facility]   HITS    Physically Hurt: Not on file    Insult or Talk Down To: Not on file    Threaten Physical Harm: Not on file    Scream or Curse: Not on file    Current Outpatient Medications on File Prior to Visit  Medication Sig Dispense Refill   albuterol (VENTOLIN HFA) 108 (90 Base) MCG/ACT inhaler Inhale 2 puffs into the lungs every 6 (six) hours as needed for wheezing or shortness of  breath. 18 g 1   aspirin EC 81 MG tablet Take 1 tablet (81 mg total) by mouth daily. Swallow whole. 90 tablet 3   Azelaic Acid 15 % gel Apply 1 Application topically 2 (two) times daily.     azelastine (ASTELIN) 0.1 % nasal spray Place 2 sprays into both nostrils  2 (two) times daily. Use in each nostril as directed 30 mL 12   beclomethasone (QVAR) 40 MCG/ACT inhaler Take 2 puffs first thing in am and then another 2 puffs about 12 hours later. 1 each 11   cetirizine (ZYRTEC) 10 MG tablet Take 1 tablet (10 mg total) by mouth daily. 90 tablet 3   Evolocumab (REPATHA SURECLICK) 140 MG/ML SOAJ INJECT 140 MG INTO THE SKIN EVERY 14 (FOURTEEN) DAYS. 6 mL 3   famotidine (PEPCID) 20 MG tablet TAKE ONE TABLET BY MOUTH AFTER SUPPER 90 tablet 3   Fluocinolone Acetonide Scalp 0.01 % OIL Apply 1 Application topically as directed. APPLY DIRECTLY TO SCALP DAILY WHEN ITCHING THE 1-2 TIMES WEEKLY FOR MAINTENANCE     fluticasone (FLONASE) 50 MCG/ACT nasal spray Place 2 sprays into both nostrils daily. 16 g 6   folic acid (FOLVITE) 1 MG tablet Take 1 mg by mouth daily.     hydroxychloroquine (PLAQUENIL) 200 MG tablet Take 400 mg by mouth daily.     ketoconazole (NIZORAL) 2 % shampoo Apply 1 Application topically 2 (two) times a week.     methotrexate (RHEUMATREX) 2.5 MG tablet Take 10 mg by mouth once a week.     montelukast (SINGULAIR) 10 MG tablet TAKE 1 TABLET BY MOUTH EVERYDAY AT BEDTIME 90 tablet 3   sulconazole nitrate 1 % external solution Apply 1 Application topically daily.     traMADol (ULTRAM) 50 MG tablet Take 1 tablet (50 mg total) by mouth every 4 (four) hours as needed (cough). 40 tablet 0   triamcinolone ointment (KENALOG) 0.1 % Apply 1 Application topically 2 (two) times daily.     No current facility-administered medications on file prior to visit.     ROS:  Review of Systems  Constitutional:  Negative for fatigue, fever and unexpected weight change.  Respiratory:  Negative for cough,  shortness of breath and wheezing.   Cardiovascular:  Negative for chest pain, palpitations and leg swelling.  Gastrointestinal:  Negative for blood in stool, constipation, diarrhea, nausea and vomiting.  Endocrine: Negative for cold intolerance, heat intolerance and polyuria.  Genitourinary:  Positive for frequency. Negative for dyspareunia, dysuria, flank pain, genital sores, hematuria, menstrual problem, pelvic pain, urgency, vaginal bleeding, vaginal discharge and vaginal pain.  Musculoskeletal:  Negative for back pain, joint swelling and myalgias.  Skin:  Negative for rash.  Neurological:  Negative for dizziness, syncope, light-headedness, numbness and headaches.  Hematological:  Negative for adenopathy.  Psychiatric/Behavioral:  Negative for agitation, confusion, sleep disturbance and suicidal ideas. The patient is not nervous/anxious.   BREAST: no sx   Objective: LMP  (LMP Unknown)    Physical Exam Constitutional:      Appearance: She is well-developed.  Genitourinary:     Vulva normal.     Right Labia: No rash, tenderness or lesions.    Left Labia: No tenderness, lesions or rash.    No vaginal discharge, erythema or tenderness.      Right Adnexa: not tender and no mass present.    Left Adnexa: not tender and no mass present.    No cervical friability or polyp.     Uterus is not enlarged or tender.  Breasts:    Right: No mass, nipple discharge, skin change or tenderness.     Left: No mass, nipple discharge, skin change or tenderness.  Neck:     Thyroid: No thyromegaly.  Cardiovascular:     Rate and Rhythm: Normal rate and regular rhythm.  Heart sounds: Normal heart sounds. No murmur heard. Pulmonary:     Effort: Pulmonary effort is normal.     Breath sounds: Normal breath sounds.  Abdominal:     Palpations: Abdomen is soft.     Tenderness: There is no abdominal tenderness. There is no guarding or rebound.  Musculoskeletal:        General: Normal range of motion.      Cervical back: Normal range of motion.  Lymphadenopathy:     Cervical: No cervical adenopathy.  Neurological:     General: No focal deficit present.     Mental Status: She is alert and oriented to person, place, and time.     Cranial Nerves: No cranial nerve deficit.  Skin:    General: Skin is warm and dry.  Psychiatric:        Mood and Affect: Mood normal.        Behavior: Behavior normal.        Thought Content: Thought content normal.        Judgment: Judgment normal.  Vitals reviewed.    No results found for this or any previous visit (from the past 24 hour(s)).   Assessment/Plan: Encounter for annual routine gynecological examination  Cervical cancer screening - Plan: Cytology - PAP  Screening for HPV (human papillomavirus) - Plan: Cytology - PAP  Dysplasia of cervix, low grade (CIN 1) - Plan: Cytology - PAP; repeat pap today  Encounter for screening mammogram for malignant neoplasm of breast--pt had mammo today  Family history of breast cancer - Plan: MM 3D SCREEN BREAST BILATERAL; pt is MyRisk neg  Increased risk of breast cancer - Plan: MM 3D SCREEN BREAST BILATERAL; pt aware of monthly SBE, yearly CBE and mammos, as well as scr breast MRI. Will call for ref prn.   Perimenopausal vasomotor symptoms--not candidate for veozah due to elevated LFTs; discussed adding prog vs SNRI vs gabapentin (pt to discuss with PCP if they want to use as pain med for arthralgias). Can also try provitalize OTC. F/u prn.   Urinary frequency - Plan: Urine Culture, POCT urine pregnancy; neg UA except hematuria. Check C&S. If neg, will re-eval hematuria with LC UA. Pt to d/c caffeine after 12 noon/no drinks before bed.   Asymptomatic microscopic hematuria        GYN counsel breast self exam, mammography screening, adequate intake of calcium and vitamin D, diet and exercise     F/U  No follow-ups on file.   Wilberth Damon B. Merla Sawka, PA-C 06/24/2023 2:42 PM

## 2023-06-25 ENCOUNTER — Encounter (INDEPENDENT_AMBULATORY_CARE_PROVIDER_SITE_OTHER): Payer: Self-pay

## 2023-06-25 ENCOUNTER — Ambulatory Visit (INDEPENDENT_AMBULATORY_CARE_PROVIDER_SITE_OTHER): Payer: 59 | Admitting: Otolaryngology

## 2023-06-25 VITALS — Wt 209.0 lb

## 2023-06-25 DIAGNOSIS — R09A2 Foreign body sensation, throat: Secondary | ICD-10-CM

## 2023-06-25 DIAGNOSIS — R0989 Other specified symptoms and signs involving the circulatory and respiratory systems: Secondary | ICD-10-CM | POA: Diagnosis not present

## 2023-06-25 DIAGNOSIS — J328 Other chronic sinusitis: Secondary | ICD-10-CM | POA: Diagnosis not present

## 2023-06-25 DIAGNOSIS — R0982 Postnasal drip: Secondary | ICD-10-CM

## 2023-06-25 DIAGNOSIS — R0981 Nasal congestion: Secondary | ICD-10-CM | POA: Diagnosis not present

## 2023-06-25 DIAGNOSIS — K219 Gastro-esophageal reflux disease without esophagitis: Secondary | ICD-10-CM

## 2023-06-25 NOTE — Progress Notes (Signed)
Dear Dr. Aleen Campi, Here is my assessment for our mutual patient, Kristy [name]. Thank you for allowing me the opportunity to care for your patient. Please do not hesitate to contact me should you have any other questions. Sincerely, Dr. Jovita Kussmaul  Otolaryngology Clinic Note Referring provider: Dr. Aleen Campi HPI:  Kristy [name] is a 52 y.o. female kindly referred by Dr. Aleen Campi for evaluation of chronic congestion and sinonasal issues.  Initial visit (05/06/2023): Has been followed by Dr. Sherene Sires since 2018. She has multiple complaints that are troublesome including chest congestion (not nasal), post nasal drip (some), and wheezing.  Most symptoms occur at night - when she lays down she has some trouble breathing. She recently had episode of bronchitis (cough, post nasal drip, throat feels "congested") which required multiple rounds of antibiotics, and steroids which helped significantly. No significant cough, but does some throat clearing. Not using mucinex. Tramadol trial prior with benefit.   Cough does not have significant triggers otherwise except for smoke and forceful inhalation (like laughing). No temperature or perfume sensitivity. She is currently doing her pulm inhalers as needed.  She is currently using Pepcid and PPI.   Not using any sprays. She has used dymista and flonase in the past, but only when congested. She has not used nasal sprays in many years.   Allergy testing was prior done (2022) - had exacerbations several years ago but now under control and not having common AR symptoms. No prior sinus surgeries.   Other complaint is globus sensation - "clump of coal" that's not moving. Did just lose her mom and all her symptoms occurred afterwards. No dysphagia. No change in voice or swallowing.   Denies frequent sinonasal infection symptoms.   --------------------------- We decided to treat her maximally including with abx/steroids and flonase, and PO antihistamine,  astelin spray. She returns for follow up with CT which was done due to thick secretions on endo - in case sinus contributing to her sx.  Today (06/25/2023): Doing much better, no trouble breathing at night, some mucus but otherwise no cough or PND.  Using flonase as needed. No other sprays. Using Pepcid and PPI. No dysphagia or change in swallowing. Symptoms wer mostly on the left  --------------------------------  Personal or FHx of bleeding dz or anesthesia difficulty: no  PMHx: RA on DMARDs; GERD; HLD  Independent Review of Additional Tests or Records:  CT Face 06/19/2023: independently reviewed; minimal left frontal opacification, likely mucous retention cyst left max, otherwise no significant disease except for small anterior ethmoid opacification.Small cyst along sphenoid.  CT Face limited 08/08/2020: Sinuses clear  Prior allergy testing reviewed and summarized Allergy testing: Environmental allergy skin prick testing is positive to [place], Timothy, cocklebur, short and giant ragweed, mugwort, hickory, oak, pecan, both dust mites, cat, dog, Alternaria, Helminthosporium, Curvularia and epicoccum nigrum Select food allergy skin prick testing is negative to celery, cabbage and green pea Allergy testing results were read and interpreted by provider, documented by clinical staff.  Pulm notes also reviewed: - Kozlow eval 2016 did not rec shots for dust/ ragweed allergy identified  -Trial of max gerd rx/ singulair 09/04/2016 >> improved 10/02/2016 > changed to prn gerd rx and ok until 04/2021 with sob / "congestion"  - placed back on daily singulair, ppi ac in and pepcid 20 in pm and d/c all inhalers  - Allergy profile 08/02/2020 >  Eos 0.1 /  IgE  2 - sinus CT 08/08/2020  No evidence of inflammatory sinus disease. - Flare mid  April 2022 p dog exp > cyclical cough rx / no inhalers > resolved as of 12/05/2020  - 12/05/2020 referred back to Dr Lucie Leather seen 02/21/21 rec add dymista  - 12/2022 flare  ? Related to d/c pm Pepcid and not taking am PPI ac > 02/20/2023 rx cyclical cough regimen with tramadol > improved somewhat 02/27/2023  - FENO 02/27/2023  = 122 > rx qvar 40 2bid/ pred x 6 d - 03/25/2023  After extensive coaching inhaler device,  effectiveness =    75% (short Ti)      Dr. Madilyn Hook notes (09/05/2021): She states the sneezing and congestion are her biggest symptoms.  She also reports nasal drainage and throat clearing.  Symptoms are year-round.  She has tried chlorpheniramine that she takes nightly and feels it helps somewhat.  She is on singulair daily for past 3 months.  She will also take zyrtec as needed and takes about 2-3 times a week.  She does feel zyrtec helps.  Has not tried xyzal or allegra before.  She did use flonase with the dog exposure above and states it did help with congestion.  She also used over-the-counter eye drop as well with dog exposure.  -Recommend use of Zyrtec 10 mg daily.  Can take additional dose if necessary.  Other long-acting antihistamines including Xyzal and Allegra that may be equally or more effective.  These are all over-the-counter options -Continue Singulair 10 mg nightly -trial dymista 1 spray each nostril twice a day.  This is a combination nasal spray with Flonase + Astelin (nasal antihistamine).  This helps with both nasal congestion and drainage.  If this is not covered by insurance then will prescribe the 2 sprays separately -For itchy watery eyes use olopatadine 0.2% 1 drop each eye daily as needed -allergen immunotherapy discussed today including protocol, benefits and risk.  Informational handout provided.  If interested in this therapuetic option you can check with your insurance carrier for coverage.  Let us know if you would like to proceed with this option.     Reactive airway, allergen driven -have access to albuterol inhaler 2 puffs every 4-6 hours as needed for cough/wheeze/shortness of breath/chest tightness.  May use 15-20  minutes prior to activity.   Monitor frequency of use.    PMH/Meds/All/SocHx/FamHx/ROS:   Past Medical History:  Diagnosis Date   Allergy    BRCA negative 11/2012, 4/19   BRCA neg 2014; MyRisk update neg except POLE VUS 4/19   Dysrhythmia    a. 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 pylori infection 10/2010   H/O exercise stress test 2018   Increased risk of breast cancer 11/2012; 4/19   5/14 IBIS=25.6%; 4/19 IBIS=25.8%   Iron deficiency anemia, unspecified    history of, normal hemoglobin 06/2019   Obesity    Seasonal allergies    ragweed, tree pollen, mild and dust per testing 08/2012   Tubular adenoma of colon 2022   Unspecified vitamin D deficiency    Vitamin D deficiency 2017     Past Surgical History:  Procedure Laterality Date   BREAST BIOPSY Right 1980s   neg   CESAREAN SECTION     DILITATION & CURRETTAGE/HYSTROSCOPY WITH NOVASURE ABLATION N/A 09/14/2015   Procedure: DILATATION & CURETTAGE/HYSTEROSCOPY WITH NOVASURE ABLATION;  Surgeon: Nadara Mustard, MD;  Location: [facility];  Service: Gynecology;  Laterality:  N/A;   IUD REMOVAL N/A 09/14/2015   Procedure: INTRAUTERINE DEVICE (IUD) REMOVAL;  Surgeon: Nadara Mustard, MD;  Location: [facility];  Service: Gynecology;  Laterality: N/A;   LAPAROSCOPIC BILATERAL SALPINGECTOMY Bilateral 09/14/2015   Procedure: LAPAROSCOPIC BILATERAL SALPINGECTOMY;  Surgeon: Nadara Mustard, MD;  Location: [facility];  Service: Gynecology;  Laterality: Bilateral;   TUBAL LIGATION Bilateral 09/14/2015   Procedure: BILATERAL TUBAL LIGATION;  Surgeon: Nadara Mustard, MD;  Location: [facility];  Service: Gynecology;  Laterality: Bilateral;   VULVAR LESION REMOVAL N/A 09/14/2015   Procedure: VULVAR LESION;  Surgeon: Nadara Mustard, MD;  Location: [facility];  Service: Gynecology;  Laterality: N/A;   WISDOM TOOTH EXTRACTION      Family History   Problem Relation Age of Onset   Heart disease Mother 27       stent, CAD   Breast cancer Mother 63       with recurrence about 34   Cancer Father        esophageal cancer   Heart disease Father 82       died of MI   Alcohol abuse Father    Esophageal cancer Father    Hypertension Sister    Hypertension Brother    CAD Brother        MI at age 50   Hypertension Brother    Breast cancer Maternal Grandmother 52   Diabetes Neg Hx    Stroke Neg Hx    Colon cancer Neg Hx    Colon polyps Neg Hx    Stomach cancer Neg Hx    Rectal cancer Neg Hx      Social Connections: Unknown (03/22/2023)   Received from [employer]   Social Network    Social Network: Not on file      Current Outpatient Medications:    albuterol (VENTOLIN HFA) 108 (90 Base) MCG/ACT inhaler, Inhale 2 puffs into the lungs every 6 (six) hours as needed for wheezing or shortness of breath., Disp: 18 g, Rfl: 1   aspirin EC 81 MG tablet, Take 1 tablet (81 mg total) by mouth daily. Swallow whole., Disp: 90 tablet, Rfl: 3   Azelaic Acid 15 % gel, Apply 1 Application topically 2 (two) times daily., Disp: , Rfl:    azelastine (ASTELIN) 0.1 % nasal spray, Place 2 sprays into both nostrils 2 (two) times daily. Use in each nostril as directed, Disp: 30 mL, Rfl: 12   beclomethasone (QVAR) 40 MCG/ACT inhaler, Take 2 puffs first thing in am and then another 2 puffs about 12 hours later., Disp: 1 each, Rfl: 11   cetirizine (ZYRTEC) 10 MG tablet, Take 1 tablet (10 mg total) by mouth daily., Disp: 90 tablet, Rfl: 3   Evolocumab (REPATHA SURECLICK) 140 MG/ML SOAJ, INJECT 140 MG INTO THE SKIN EVERY 14 (FOURTEEN) DAYS., Disp: 6 mL, Rfl: 3   famotidine (PEPCID) 20 MG tablet, TAKE ONE TABLET BY MOUTH AFTER SUPPER, Disp: 90 tablet, Rfl: 3   Fluocinolone Acetonide Scalp 0.01 % OIL, Apply 1 Application topically as directed. APPLY DIRECTLY TO SCALP DAILY WHEN ITCHING THE 1-2 TIMES WEEKLY FOR MAINTENANCE, Disp: , Rfl:    fluticasone (FLONASE)  50 MCG/ACT nasal spray, Place 2 sprays into both nostrils daily., Disp: 16 g, Rfl: 6   folic acid (FOLVITE) 1 MG tablet, Take 1 mg by mouth daily., Disp: , Rfl:    hydroxychloroquine (PLAQUENIL) 200 MG tablet, Take 400 mg by mouth daily., Disp: , Rfl:  ketoconazole (NIZORAL) 2 % shampoo, Apply 1 Application topically 2 (two) times a week., Disp: , Rfl:    methotrexate (RHEUMATREX) 2.5 MG tablet, Take 10 mg by mouth once a week., Disp: , Rfl:    montelukast (SINGULAIR) 10 MG tablet, TAKE 1 TABLET BY MOUTH EVERYDAY AT BEDTIME, Disp: 90 tablet, Rfl: 3   sulconazole nitrate 1 % external solution, Apply 1 Application topically daily., Disp: , Rfl:    traMADol (ULTRAM) 50 MG tablet, Take 1 tablet (50 mg total) by mouth every 4 (four) hours as needed (cough)., Disp: 40 tablet, Rfl: 0   triamcinolone ointment (KENALOG) 0.1 %, Apply 1 Application topically 2 (two) times daily., Disp: , Rfl:    Physical Exam:   Wt 209 lb (94.8 kg)   LMP  (LMP Unknown)   BMI 34.78 kg/m    Salient findings:  CN II-XII intact  Bilateral EAC clear and TM intact with well pneumatized middle ear spaces Anterior rhinoscopy: Septum relatively midline; bilateral inferior turbinates with modest hypertrophy No lesions of oral cavity/oropharynx No obviously palpable neck masses/lymphadenopathy/thyromegaly No respiratory distress or stridor; voice quality class 1.5  Procedures:  Procedure Note (prior, not today) Pre-procedure diagnosis:  Post nasal drip, globus sensation, throat clearing Post-procedure diagnosis: Same Procedure: Transnasal Fiberoptic Laryngoscopy, CPT 31575 - Mod 25 Indication: post nasal drip, concern for sinusitis, globus sensation  Findings: The nasal cavity and nasopharynx did not reveal any masses or lesions, mucosa appeared to be without obvious lesions. Some thick mucoid secretions b/l MM with query trace purulence, but otherwise no purulence. The tongue base, pharyngeal walls, piriform sinuses,  vallecula, epiglottis and postcricoid region are normal in appearance. The visualized portion of the subglottis and proximal trachea is widely patent. The vocal folds are mobile bilaterally. There are no lesions on the free edge of the vocal folds nor elsewhere in the larynx worrisome for malignancy.  Minimal secretions pyriform  Electronically signed by: Read Drivers, MD 06/25/2023 10:23 AM    Impression & Plans:  Kristy [name] is a 51 y.o. female with multiple complaints including: Chronic bilateral nasal congestion with chronic sinusitis - improved Post nasal drip - resolved Globus sensation - resolved Chronic Throat clearing - resolved Chronic cough - significantly improved LPR  She likely has multiple etiologies including LPR, with sinonasal symptoms exacerbated by allergy symptoms and with a reactive airway. Significantly improved after course of abx/steroids. CT without significant disease and given improvement, we decided to not pursue FESS, which is reasonable. I did advise her that if she were to continue having exacerbations, could consider left FESS given improvement with sinus regimen and abx/steroids.  - Continue flonase nasal spray 2 sprays twice daily in each nostril - use it during allergy - Continue home antihistamine PO - Continue Astelin spray as needed - two sprays each nostril as needed for congestion - f/u as needed given significant improvement and resolution of symptoms; if continues to have persistent sx/frequent exacerbations, consider FESS - esp on left   Thank you for allowing me the opportunity to care for your patient. Please do not hesitate to contact me should you have any other questions.  Sincerely, Jovita Kussmaul, MD Otolarynoglogist (ENT), [facility] Phone: [phone] Fax: [phone]  06/25/2023, 10:23 AM    I have personally spent 32 minutes involved in face-to-face and non-face-to-face activities for this patient on  the day of the visit.  Professional time spent includes the following activities, in addition to those noted in the documentation:  preparing to see the patient (review of outside documentation and results), performing a medically appropriate examination and/or evaluation, counseling and educating the patient/family/caregiver, ordering medications, referring and communicating with other healthcare professionals, documenting clinical information in the electronic or other health record, independently interpreting results and communicating results with the patient/family/caregiver

## 2023-06-26 ENCOUNTER — Ambulatory Visit (INDEPENDENT_AMBULATORY_CARE_PROVIDER_SITE_OTHER): Payer: 59 | Admitting: Obstetrics and Gynecology

## 2023-06-26 ENCOUNTER — Other Ambulatory Visit: Payer: Self-pay | Admitting: Obstetrics and Gynecology

## 2023-06-26 ENCOUNTER — Encounter: Payer: Self-pay | Admitting: Obstetrics and Gynecology

## 2023-06-26 VITALS — BP 118/76 | HR 89 | Ht 65.0 in | Wt 215.0 lb

## 2023-06-26 DIAGNOSIS — Z1239 Encounter for other screening for malignant neoplasm of breast: Secondary | ICD-10-CM

## 2023-06-26 DIAGNOSIS — Z01411 Encounter for gynecological examination (general) (routine) with abnormal findings: Secondary | ICD-10-CM

## 2023-06-26 DIAGNOSIS — Z0000 Encounter for general adult medical examination without abnormal findings: Secondary | ICD-10-CM

## 2023-06-26 DIAGNOSIS — N951 Menopausal and female climacteric states: Secondary | ICD-10-CM

## 2023-06-26 DIAGNOSIS — Z9189 Other specified personal risk factors, not elsewhere classified: Secondary | ICD-10-CM

## 2023-06-26 DIAGNOSIS — Z803 Family history of malignant neoplasm of breast: Secondary | ICD-10-CM

## 2023-06-26 DIAGNOSIS — Z01419 Encounter for gynecological examination (general) (routine) without abnormal findings: Secondary | ICD-10-CM

## 2023-06-26 DIAGNOSIS — N941 Unspecified dyspareunia: Secondary | ICD-10-CM

## 2023-06-26 DIAGNOSIS — Z1231 Encounter for screening mammogram for malignant neoplasm of breast: Secondary | ICD-10-CM

## 2023-06-26 DIAGNOSIS — R3121 Asymptomatic microscopic hematuria: Secondary | ICD-10-CM | POA: Diagnosis not present

## 2023-06-26 LAB — POCT URINALYSIS DIPSTICK
Bilirubin, UA: NEGATIVE
Glucose, UA: NEGATIVE
Ketones, UA: NEGATIVE
Leukocytes, UA: NEGATIVE
Nitrite, UA: NEGATIVE
Protein, UA: NEGATIVE
Spec Grav, UA: 1.025 (ref 1.010–1.025)
pH, UA: 5 (ref 5.0–8.0)

## 2023-06-26 MED ORDER — VEOZAH 45 MG PO TABS: 45.0000 mg | ORAL_TABLET | Freq: Every day | ORAL | 11 refills | Status: DC

## 2023-06-26 NOTE — Patient Instructions (Signed)
I value your feedback and you entrusting us with your care. If you get a [facility] patient survey, I would appreciate you taking the time to let us know about your experience today. Thank you! ? ? ?

## 2023-06-27 LAB — FOLLICLE STIMULATING HORMONE: FSH: 104 m[IU]/mL

## 2023-06-27 LAB — ESTRADIOL: Estradiol: 25.9 pg/mL

## 2023-07-03 ENCOUNTER — Ambulatory Visit (INDEPENDENT_AMBULATORY_CARE_PROVIDER_SITE_OTHER): Payer: 59

## 2023-07-03 ENCOUNTER — Other Ambulatory Visit: Payer: 59

## 2023-07-10 ENCOUNTER — Other Ambulatory Visit: Payer: Self-pay | Admitting: Obstetrics and Gynecology

## 2023-07-10 DIAGNOSIS — N951 Menopausal and female climacteric states: Secondary | ICD-10-CM

## 2023-07-10 NOTE — Telephone Encounter (Signed)
Pt to use coupon card so no PA or alternative needed. Pt is also the one who called earlier about liver concerns so not sure if she's going to fill.

## 2023-07-10 NOTE — Progress Notes (Signed)
It was reported in 1 case during clinical trials, hence the FDA asks we check pt's liver labs monthly for 3 months then at the 6 month mark. We discussed this at her appt and her current liver labs are fine.

## 2023-08-13 ENCOUNTER — Telehealth: Payer: Self-pay | Admitting: Obstetrics and Gynecology

## 2023-08-13 NOTE — Telephone Encounter (Signed)
Hello Kristy [name], the patient called stated that she is suppose to have labs but was told her of there wasn't an order.  Will you please check on this and let me know and I will call the patient back.  Thanks, [employer]

## 2023-08-14 ENCOUNTER — Other Ambulatory Visit: Payer: Self-pay

## 2023-08-14 ENCOUNTER — Other Ambulatory Visit: Payer: Self-pay | Admitting: Obstetrics and Gynecology

## 2023-08-14 DIAGNOSIS — Z0000 Encounter for general adult medical examination without abnormal findings: Secondary | ICD-10-CM

## 2023-08-14 NOTE — Telephone Encounter (Signed)
CMP standing order is already in system. Charlene to notify pt.

## 2023-08-14 NOTE — Addendum Note (Signed)
Addended by: Althea Grimmer B on: 08/14/2023 10:36 AM   Modules accepted: Orders

## 2023-09-01 NOTE — Progress Notes (Unsigned)
Subjective:    Patient ID: Kristy [name], female   DOB: 02/05/1971    MRN: [id number]    Brief patient profile:  61 yobf never smoker works as Interior and spatial designer  with sinus issues around ? 2000 esp in fall better while she could get Tavist then 2016 eval by allergy  ? Kozlow's [name] pos dust/ ragweed didn't rec any shots and no f/u then variable sob assoc sense of congestion referred to pulmonary clinic 09/04/2016 by [place] Master.  New dx of RA hand early 2024 >  methotrexate / plaquenil some better    History of Present Illness  09/04/2016 1st [name] visit/ Kristy [name]   Chief Complaint  Patient presents with   Pulmonary Consult    Referred by Dr. Aleen Campi.  Pt c/o SOB for the past several months. She states also having some chest congestion. She has had cough x 2 wks- non prod and sometimes to the point of vomitting. Sometimes cough wakes her up at night.  She states she gets completely out of breath just walking one flight of stairs.   indolent onset 12/2015 of chest "congestion" and need to clear throat  present every day since onset onset   and unable to consistently lie flat due to choking sensation assoc hoarseness and dysphagia/  but s excess/ purulent sputum or mucus plugs. Cough so bad vomits / worse with albuterol  Better since started prilosec but not taking consistently Last pred x 6 days not recently ? Helped some  rec Start singulair 10 mg every evening  Change Omeprazole 40 mg   Take  30-60 min before first meal of the day and Pepcid (famotidine)  20 mg one @  bedtime until return to [name] - this is the best way to tell whether stomach acid is contributing to your problem.  GERD  Diet     02/20/2023  ACUTE  ov/[name]/Kristy [name] re: cough x 2017 c/w uacs from allergic rhinitis  recurrent pattern  maint on zyrtec and singulair/ no nasal sprays  Does not remember allergy recs or whether tried dymista  / cough onset may have correlated with temporary d/c pepcid in  pm assoc with noct "wheeze"  Chief Complaint  Patient presents with   Shortness of Breath  Dyspnea:  onset x 2 m worse assoc with cough > sensation of mucus stuck in chest (points to midline) > min mucoid/ assoc pnds nothing purulent/ subj wheeze esp hs / no better with abx bevespi prednisone /multiple providers and "nothing 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   Stop bevespi  Only use your albuterol as a rescue medication Omerazole (prilosec ) 40 mg   Take  30-60 min before first meal of the day and Pepcid (famotidine)  20 mg after supper until return to [name]  GERD diet reviewed, bed blocks rec For drainage / throat tickle try take CHLORPHENIRAMINE  4 mg  If not better by 02/24/23 we need to see you with all medications and inhalers     02/27/2023  f/u  ov/Kristy [name] re: cough x 2017  maint on no inhalers    Chief Complaint  Patient presents with   Acute Visit    Productive cough persistent x 1 month.  Congestion mostly in throat.  Dyspnea:  walking fast  Cough: much worse with laughter/ certain smells / mucusvariably green - worse 1st thing in am  Sense of globus  Sleeping: still on a flat bed / freq awakening due to cough and noisy breathing.  SABA use: rarely  02: none  Rec For cough/ congestion >  Mucinex dm 1200  up to maximum of  1200 mg every 12 hours and use the flutter valve as much as you can   Augmentin 875 mg take one pill twice daily  X 10 days - Bed blocks 6-8" and take the omeprazole 40 mg Take 30-60 min before first meal of the day  My [name] will be contacting you by phone for referral to ENT  for globus sensation   Please schedule a follow up [name] visit in 4 weeks, sooner if needed  Late add  - FENO 02/27/2023  = 122 > rx qvar 40 2bid/ pred x 6 d > called in    03/25/2023  f/u ov/Kristy [name]/Kristy [name] re: recurrent cough pattern since 2017  maint on qvar 40 2bid   Chief Complaint  Patient presents with   Follow-up    Medication did help still having wheezing, when urgent care , given zpac and given medication acid reflux      Dyspnea:  walking up to 30 min daily some hills  Cough: slt yellow assoc with nasal congetion  Sleeping: 45  degrees some better  on day 3 zmax      SABA use: rarely  02: none   Rec Continue qvar 40  Take 2 puffs first thing in am and then another 2 puffs about 12 hours later.  Only use your albuterol as a rescue medication to be used if you can't catch your breath by resting or doing a relaxed purse lip breathing pattern.  - The less you use it, the better it will work when you need it. - Ok to use up to 2 puffs  every 4 hours if you must but call for immediate appointment if use goes up over your usual need - Don't leave home without it !!  (think of it like the spare tire for your car)   Be sure to tell ENT doctor about your sinuses when this problem  started and that you have RA (which can affect the vocal cords)   Please schedule a follow up visit in 3 months but call sooner if needed  with all medications /inhalers/ solutions in hand so we can verify exactly what you are taking. This includes all medications from all doctors and over the counters     09/03/2023 VIRTUAL OV  f/u ov/Kristy [name] re: RA pt with UACS   maint on no resp rx since ENT eval and rx  No chief complaint on file.  Dyspnea:  Not limited by breathing from desired activities   Cough: resolved  Sleeping:elevate bed sltly s  resp cc  SABA use: none 02: none  No obvious day to day or daytime variability or assoc excess/ purulent sputum or mucus plugs or hemoptysis or cp or chest tightness, subjective  wheeze or overt sinus or hb symptoms.    Also denies any obvious fluctuation of symptoms with weather or environmental changes or other aggravating or alleviating factors  except as outlined above   No unusual exposure hx or h/o childhood pna/ asthma or knowledge of premature birth.  Current Allergies, Complete Past Medical History, Past Surgical History, Family History, and Social History were reviewed in [employer] record.  ROS  The following are not active complaints unless bolded Hoarseness, sore throat, dysphagia, dental problems, itching, sneezing,  nasal congestion or discharge of excess mucus or purulent secretions, ear ache,   fever, chills, sweats, unintended wt loss or wt gain, classically pleuritic or exertional cp,  orthopnea pnd or arm/hand swelling  or leg swelling, presyncope, palpitations, abdominal pain, anorexia, nausea, vomiting, diarrhea  or change in bowel habits or change in bladder habits, change in stools or change in urine, dysuria, hematuria,  rash, arthralgias, visual complaints, headache, numbness, weakness or ataxia or problems with walking or coordination,  change in mood or  memory.        No outpatient medications have been marked as taking for the 09/03/23 encounter (Appointment) with Kristy Cowden, MD.                 Objective:   Physical Exam   Wts  09/03/2023       virtual  03/25/2023       225  02/20/2023         230  12/05/2020       221  11/07/2020       222 08/29/2020       219  08/02/2020       217  10/02/2016      213   09/04/16 209 lb (94.8 kg)  08/27/16 216 lb (98 kg)  08/20/16 214 lb 9.6 oz (97.3 kg)      Looks great -  nl voice texture/ no conversational dyspnea or spont cough      Assessment:

## 2023-09-03 ENCOUNTER — Encounter: Payer: Self-pay | Admitting: Internal Medicine

## 2023-09-03 ENCOUNTER — Telehealth: Payer: 59 | Admitting: Internal Medicine

## 2023-09-03 DIAGNOSIS — R058 Other specified cough: Secondary | ICD-10-CM

## 2023-09-03 NOTE — Patient Instructions (Signed)
All sinus problems to ENT    Ok to wean off anti-histamines and reflux medicines   Discuss with Rheumatology whether you need annual pulmonary evaluation.

## 2023-09-03 NOTE — Assessment & Plan Note (Signed)
Onset around 2000 - Kozlow eval 2016 did not rec shots for dust/ ragweed allergy identified  -Trial of max gerd rx/ singulair 09/04/2016 >> improved 10/02/2016 > changed to prn gerd rx and ok until 04/2021 with sob / "congestion"  - placed back on daily singulair, ppi ac in and pepcid 20 in pm and d/c all inhalers  - Allergy profile 08/02/2020 >  Eos 0.1 /  IgE  2 - sinus CT 08/08/2020  No evidence of inflammatory sinus disease. - Flare mid April 2022 p dog exp > cyclical cough rx / no inhalers > resolved as of 12/05/2020  - 12/05/2020 referred back to Dr Lucie Leather seen 02/21/21 rec add dymista  - 12/2022 flare ? Related to d/c pm Pepcid and not taking am PPI ac > 02/20/2023 rx cyclical cough regimen with tramadol > improved somewhat 02/27/2023  - FENO 02/27/2023  = 122 > rx qvar 40 2bid/ pred x 6 d - 03/25/2023  After extensive coaching inhaler device,  effectiveness =    75% (short Ti)   Since ent eval /rx > no cough or sob so ok to per off gerd rx and antihistamines and use them prn   F/u per Rheum > if annual eval needed due to RA or RA meds we can schedule that thru office at her convenience          Each maintenance medication was reviewed in detail including emphasizing most importantly the difference between maintenance and prns and under what circumstances the prns are to be triggered using an action plan format where appropriate.  Total time for H and P, chart review, counseling,  and generating customized AVS unique to this office visit / same day charting = 20 min

## 2023-09-14 LAB — LAB REPORT - SCANNED: EGFR: 87

## 2023-12-06 ENCOUNTER — Other Ambulatory Visit: Payer: Self-pay | Admitting: Medical

## 2023-12-25 ENCOUNTER — Encounter (INDEPENDENT_AMBULATORY_CARE_PROVIDER_SITE_OTHER): Payer: Self-pay | Admitting: Otolaryngology

## 2023-12-25 ENCOUNTER — Ambulatory Visit (INDEPENDENT_AMBULATORY_CARE_PROVIDER_SITE_OTHER): Payer: Self-pay | Admitting: Otolaryngology

## 2023-12-25 VITALS — BP 126/82 | HR 72 | Ht 65.0 in | Wt 199.0 lb

## 2023-12-25 DIAGNOSIS — R09A2 Foreign body sensation, throat: Secondary | ICD-10-CM

## 2023-12-25 DIAGNOSIS — R0981 Nasal congestion: Secondary | ICD-10-CM

## 2023-12-25 DIAGNOSIS — R0982 Postnasal drip: Secondary | ICD-10-CM

## 2023-12-25 DIAGNOSIS — J328 Other chronic sinusitis: Secondary | ICD-10-CM

## 2023-12-25 DIAGNOSIS — K219 Gastro-esophageal reflux disease without esophagitis: Secondary | ICD-10-CM

## 2023-12-25 MED ORDER — AZELASTINE HCL 0.1 % NA SOLN: 2.0000 | Freq: Two times a day (BID) | NASAL | 12 refills | Status: DC

## 2023-12-25 MED ORDER — FLUTICASONE PROPIONATE 50 MCG/ACT NA SUSP: 2.0000 | Freq: Every day | NASAL | 6 refills | Status: DC

## 2023-12-25 NOTE — Progress Notes (Signed)
 Dear Dr. Azalea Lento, Here is my assessment for our mutual patient, Kristy [name]. Thank you for allowing me the opportunity to care for your patient. Please do not hesitate to contact me should you have any other questions. Sincerely, Dr. Milon Aloe  Otolaryngology Clinic Note Referring provider: Dr. Azalea Lento HPI:  Kristy [name] is a 53 y.o. female kindly referred by Dr. Azalea Lento for evaluation of chronic congestion and sinonasal issues.  Initial visit (05/06/2023): Has been followed by Dr. Waymond Hailey since 2018. She has multiple complaints that are troublesome including chest congestion (not nasal), post nasal drip (some), and wheezing.  Most symptoms occur at night - when she lays down she has some trouble breathing. She recently had episode of bronchitis (cough, post nasal drip, throat feels congested) which required multiple rounds of antibiotics, and steroids which helped significantly. No significant cough, but does some throat clearing. Not using mucinex. Tramadol  trial prior with benefit.   Cough does not have significant triggers otherwise except for smoke and forceful inhalation (like laughing). No temperature or perfume sensitivity. She is currently doing her pulm inhalers as needed.  She is currently using Pepcid  and PPI.   Not using any sprays. She has used dymista  and flonase  in the past, but only when congested. She has not used nasal sprays in many years.   Allergy testing was prior done (2022) - had exacerbations several years ago but now under control and not having common AR symptoms. No prior sinus surgeries.   Other complaint is globus sensation - clump of coal that's not moving. Did just lose her mom and all her symptoms occurred afterwards. No dysphagia. No change in voice or swallowing.   Denies frequent sinonasal infection symptoms.   --------------------------- We decided to treat her maximally including with abx/steroids and flonase , and PO antihistamine,  astelin  spray. She returns for follow up with CT which was done due to thick secretions on endo - in case sinus contributing to her sx.  Today (06/25/2023): Doing much better, no trouble breathing at night, some mucus but otherwise no cough or PND.  Using flonase  as needed. No other sprays. Using Pepcid  and PPI. No dysphagia or change in swallowing. Symptoms wer mostly on the left --------------------------------------------------------- 12/25/2023 She reports that she got the flu (Feb) and since then has noted some congestion - sometimes nasal, sometimes throat. Given abx and steroids, did help. She feels like she has some chest congestion, mostly at night. No cough, not throat clearing.  Not on reflux meds - no GERD sx after changed her diet. Not using sprays in the nose. No facial pain/pressure, discolored drainage. No change in swallowing or voice. Mostly trouble at night when lying down  Personal or FHx of bleeding dz or anesthesia difficulty: no  PMHx: RA on DMARDs; GERD; HLD  Independent Review of Additional Tests or Records:  CT Face 06/19/2023: independently reviewed; minimal left frontal opacification, likely mucous retention cyst left max, otherwise no significant disease except for small anterior ethmoid opacification.Small cyst along sphenoid.  CT Face limited 08/08/2020: Sinuses clear  Prior allergy testing reviewed and summarized Allergy testing: Environmental allergy skin prick testing is positive to [place], Timothy, cocklebur, short and giant ragweed, mugwort, hickory, oak, pecan, both dust mites, cat, dog, Alternaria, Helminthosporium, Curvularia and epicoccum nigrum Select food allergy skin prick testing is negative to celery, cabbage and green pea Allergy testing results were read and interpreted by provider, documented by clinical staff.  Pulm notes also reviewed: - Kozlow eval 2016 did not  rec shots for dust/ ragweed allergy identified  -Trial of max gerd rx/ singulair   09/04/2016 >> improved 10/02/2016 > changed to prn gerd rx and ok until 04/2021 with sob / congestion  - placed back on daily singulair , ppi ac in and pepcid  20 in pm and d/c all inhalers  - Allergy profile 08/02/2020 >  Eos 0.1 /  IgE  2 - sinus CT 08/08/2020  No evidence of inflammatory sinus disease. - Flare mid April 2022 p dog exp > cyclical cough rx / no inhalers > resolved as of 12/05/2020  - 12/05/2020 referred back to Dr Kozlow seen 02/21/21 rec add dymista   - 12/2022 flare ? Related to d/c pm Pepcid  and not taking am PPI ac > 02/20/2023 rx cyclical cough regimen with tramadol  > improved somewhat 02/27/2023  - FENO 02/27/2023  = 122 > rx qvar  40 2bid/ pred x 6 d - 03/25/2023  After extensive coaching inhaler device,  effectiveness =    75% (short Ti)      Dr. Spencer Dy notes (09/05/2021): She states the sneezing and congestion are her biggest symptoms.  She also reports nasal drainage and throat clearing.  Symptoms are year-round.  She has tried chlorpheniramine that she takes nightly and feels it helps somewhat.  She is on singulair  daily for past 3 months.  She will also take zyrtec  as needed and takes about 2-3 times a week.  She does feel zyrtec  helps.  Has not tried xyzal or allegra before.  She did use flonase  with the dog exposure above and states it did help with congestion.  She also used over-the-counter eye drop as well with dog exposure.  -Recommend use of Zyrtec  10 mg daily.  Can take additional dose if necessary.  Other long-acting antihistamines including Xyzal and Allegra that may be equally or more effective.  These are all over-the-counter options -Continue Singulair  10 mg nightly -trial dymista  1 spray each nostril twice a day.  This is a combination nasal spray with Flonase  + Astelin  (nasal antihistamine).  This helps with both nasal congestion and drainage.  If this is not covered by insurance then will prescribe the 2 sprays separately -For itchy watery eyes use olopatadine   0.2% 1 drop each eye daily as needed -allergen immunotherapy discussed today including protocol, benefits and risk.  Informational handout provided.  If interested in this therapuetic option you can check with your insurance carrier for coverage.  Let us  know if you would like to proceed with this option.     Reactive airway, allergen driven -have access to albuterol  inhaler 2 puffs every 4-6 hours as needed for cough/wheeze/shortness of breath/chest tightness.  May use 15-20 minutes prior to activity.   Monitor frequency of use.    Dr. Waymond Hailey (09/03/2023): seen in follow up; noted cough and congestion, mostly with flat bed; some discolored drainage; no resp rx since ENT eval and Rx; no dyspnea or cough CBC and CMP 09/14/2023: WBC 3.8, Eos 300, BUN/Cr 7/0.8  PMH/Meds/All/SocHx/FamHx/ROS:   Past Medical History:  Diagnosis Date   Allergy    BRCA negative 11/2012, 4/19   BRCA neg 2014; MyRisk update neg except POLE VUS 4/19   Dysrhythmia    a. 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 pylori infection 10/2010   H/O exercise stress test 2018   Increased risk of breast cancer 11/2012; 4/19  5/14 IBIS=25.6%; 4/19 IBIS=25.8%   Iron deficiency anemia, unspecified    history of, normal hemoglobin 06/2019   Obesity    Rheumatoid arthritis (HCC)    Seasonal allergies    ragweed, tree pollen, mild and dust per testing 08/2012   Tubular adenoma of colon 2022   Unspecified vitamin D  deficiency    Vitamin D  deficiency 2017     Past Surgical History:  Procedure Laterality Date   BREAST BIOPSY Right 1980s   neg   CESAREAN SECTION     DILITATION & CURRETTAGE/HYSTROSCOPY WITH NOVASURE ABLATION N/A 09/14/2015   Procedure: DILATATION & CURETTAGE/HYSTEROSCOPY WITH NOVASURE ABLATION;  Surgeon: Alben Alma, MD;  Location: [facility];  Service: Gynecology;  Laterality: N/A;   IUD REMOVAL N/A  09/14/2015   Procedure: INTRAUTERINE DEVICE (IUD) REMOVAL;  Surgeon: Alben Alma, MD;  Location: [facility];  Service: Gynecology;  Laterality: N/A;   LAPAROSCOPIC BILATERAL SALPINGECTOMY Bilateral 09/14/2015   Procedure: LAPAROSCOPIC BILATERAL SALPINGECTOMY;  Surgeon: Alben Alma, MD;  Location: [facility];  Service: Gynecology;  Laterality: Bilateral;   TUBAL LIGATION Bilateral 09/14/2015   Procedure: BILATERAL TUBAL LIGATION;  Surgeon: Alben Alma, MD;  Location: [facility];  Service: Gynecology;  Laterality: Bilateral;   VULVAR LESION REMOVAL N/A 09/14/2015   Procedure: VULVAR LESION;  Surgeon: Alben Alma, MD;  Location: [facility];  Service: Gynecology;  Laterality: N/A;   WISDOM TOOTH EXTRACTION      Family History  Problem Relation Age of Onset   Heart disease Mother 45       stent, CAD   Breast cancer Mother 49       with recurrence about 27   Cancer Father        esophageal cancer   Heart disease Father 15       died of MI   Alcohol abuse Father    Esophageal cancer Father    Hypertension Sister    Hypertension Brother    CAD Brother        MI at age 55   Hypertension Brother    Breast cancer Maternal Grandmother 46   Diabetes Neg Hx    Stroke Neg Hx    Colon cancer Neg Hx    Colon polyps Neg Hx    Stomach cancer Neg Hx    Rectal cancer Neg Hx      Social Connections: Unknown (03/22/2023)   Received from [employer]   Social Network    Social Network: Not on file      Current Outpatient Medications:    albuterol  (VENTOLIN  HFA) 108 (90 Base) MCG/ACT inhaler, Inhale 2 puffs into the lungs every 6 (six) hours as needed for wheezing or shortness of breath., Disp: 18 g, Rfl: 1   aspirin  EC 81 MG tablet, Take 1 tablet (81 mg total) by mouth daily. Swallow whole., Disp: 90 tablet, Rfl: 3   Azelaic Acid 15 % gel, Apply 1 Application topically 2 (two) times daily., Disp: , Rfl:    azelastine  (ASTELIN ) 0.1 % nasal spray, Place 2 sprays into both nostrils 2 (two) times  daily. Use in each nostril as directed, Disp: 30 mL, Rfl: 12   cetirizine  (ZYRTEC ) 10 MG tablet, Take 1 tablet (10 mg total) by mouth daily., Disp: 90 tablet, Rfl: 3   Evolocumab  (REPATHA  SURECLICK) 140 MG/ML SOAJ, INJECT 140 MG INTO THE SKIN EVERY 14 (FOURTEEN) DAYS., Disp: 6 mL, Rfl: 3   famotidine  (PEPCID ) 20 MG tablet, TAKE ONE TABLET BY  MOUTH AFTER SUPPER, Disp: 90 tablet, Rfl: 3   Fezolinetant  (VEOZAH ) 45 MG TABS, Take 1 tablet (45 mg total) by mouth daily. 2 SAMPLES GIVEN, Disp: 30 tablet, Rfl: 11   Fluocinolone Acetonide Scalp 0.01 % OIL, Apply 1 Application topically as directed. APPLY DIRECTLY TO SCALP DAILY WHEN ITCHING THE 1-2 TIMES WEEKLY FOR MAINTENANCE, Disp: , Rfl:    fluticasone  (FLONASE ) 50 MCG/ACT nasal spray, Place 2 sprays into both nostrils daily., Disp: 16 g, Rfl: 6   folic acid (FOLVITE) 1 MG tablet, Take 1 mg by mouth daily., Disp: , Rfl:    hydroxychloroquine (PLAQUENIL) 200 MG tablet, Take 400 mg by mouth daily., Disp: , Rfl:    ketoconazole (NIZORAL) 2 % shampoo, Apply 1 Application topically 2 (two) times a week., Disp: , Rfl:    methotrexate (RHEUMATREX) 2.5 MG tablet, Take 10 mg by mouth once a week., Disp: , Rfl:    montelukast  (SINGULAIR ) 10 MG tablet, TAKE 1 TABLET BY MOUTH EVERYDAY AT BEDTIME, Disp: 90 tablet, Rfl: 3   sulconazole nitrate 1 % external solution, Apply 1 Application topically daily., Disp: , Rfl:    triamcinolone ointment (KENALOG) 0.1 %, Apply 1 Application topically 2 (two) times daily., Disp: , Rfl:    Physical Exam:   BP 126/82 (BP Location: Left Arm, Patient Position: Sitting, Cuff Size: Large)   Pulse 72   Ht 5' 5 (1.651 m)   Wt 199 lb (90.3 kg)   LMP  (LMP Unknown)   BMI 33.12 kg/m    Salient findings:  CN II-XII intact  Bilateral EAC clear and TM intact with well pneumatized middle ear spaces Anterior rhinoscopy: Septum relatively midline; bilateral inferior turbinates with modest hypertrophy; Nasal endoscopy was indicated to  better evaluate the nose and paranasal sinuses, given the patient's history and exam findings, and is detailed below. No lesions of oral cavity/oropharynx No obviously palpable neck masses/lymphadenopathy/thyromegaly No respiratory distress or stridor; voice quality class 1.5; TFL was indicated to better evaluate the proximal airway, given the patient's history and exam findings, and is detailed below. Electronically signed by: Evelina Hippo, MD 12/25/2023 8:45 AM   Procedures:  PROCEDURE: Bilateral Diagnostic Rigid Nasal Endoscopy Pre-procedure diagnosis: History of sinusitis, nasal congestion, post nasal drip Post-procedure diagnosis: same Indication: See pre-procedure diagnosis and physical exam above Complications: None apparent EBL: 0 mL Anesthesia: Lidocaine  4% and topical decongestant was topically sprayed in each nasal cavity  Description of Procedure:  Patient was identified. A rigid 30 degree endoscope was utilized to evaluate the sinonasal cavities, mucosa, sinus ostia and turbinates and septum.  Overall, signs of mucosal inflammation are not noted.  Also noted are some thick mucoid secretions globally.  No mucopurulence, polyps, or masses noted.   Right Middle meatus: clear Right SE Recess: clear Left MM: clear Left SE Recess: clear    Photodocumentation was obtained.  CPT CODE -- 16109 - Mod 25  Procedure Note Pre-procedure diagnosis: LPR, Globus sensation Post-procedure diagnosis: Same Procedure: Transnasal Fiberoptic Laryngoscopy, CPT 31575 - Mod 25 Indication: see above Complications: None apparent EBL: 0 mL  The procedure was undertaken to further evaluate the patient's complaint above, with mirror exam inadequate for appropriate examination due to gag reflex and poor patient tolerance  Procedure:  Patient was identified as correct patient. Verbal consent was obtained. The nose was sprayed with oxymetazoline and 4% lidocaine . The The flexible laryngoscope was  passed through the nose to view the nasal cavity, pharynx (oropharynx, hypopharynx) and larynx.  The larynx  was examined at rest and during multiple phonatory tasks. Documentation was obtained and reviewed with patient. The scope was removed. The patient tolerated the procedure well.  Findings: The nasal cavity and nasopharynx did not reveal any masses or lesions, mucosa appeared to be without obvious lesions. The tongue base, pharyngeal walls, piriform sinuses, vallecula, epiglottis and postcricoid region are normal in appearance EXCEPT: some thick secretions in vallecula area. The visualized portion of the subglottis and proximal trachea is widely patent. The vocal folds are mobile bilaterally. There are no lesions on the free edge of the vocal folds nor elsewhere in the larynx worrisome for malignancy.      Electronically signed by: Evelina Hippo, MD 12/25/2023 8:26 AM    Impression & Plans:  Kristy [name] is a 53 y.o. female with multiple complaints including: Chronic bilateral nasal congestion with chronic sinusitis - recurred Post nasal drip - recurred Globus sensation - recurred Chronic Throat clearing - resolved Chronic cough - resolved LPR - suspect based on sx  Did well until she had a URI with recurrence of sx. Tried abx/steroids and helps some but now problem appears to be mostly at night with PND, congestion and likely LPR; She likely has multiple etiologies including LPR, with sinonasal symptoms exacerbated by allergy symptoms and with a reactive airway. Significantly improved after course of abx/steroids.   I did advise her that if she were to continue having exacerbations, could consider left FESS given improvement with sinus regimen and abx/steroids.  - Restart flonase  50mcg nasal spray 2 sprays twice daily in each nostril; start astelin  BID - PO anthistamine x2 weeks - Start reflux gourmet - Daily sinus rinse F/u 4 months; if persistent sx, can consider fess to  facilitate fewer sinus infections   Thank you for allowing me the opportunity to care for your patient. Please do not hesitate to contact me should you have any other questions.  Sincerely, Milon Aloe, MD Otolarynoglogist (ENT), [facility] Phone: [phone] Fax: [phone]  12/25/2023, 8:26 AM    MDM:  Level 4: 99214 Complexity/Problems addressed: mod - chronic problems, exacerbation Data complexity: mod - review of notes, labs - Morbidity: mod - Drug prescribed or managed: yes

## 2023-12-25 NOTE — Patient Instructions (Addendum)
 Use two sprays of flonase  in each nostril twice per day  right after, use astelin  spray two sprays each nostril twice per day  Reflux Gourmet ---- can buy on amazon --- use after you eat at night.   Andres Bangs Med Nasal Saline Rinse - use daily; stop 3 days before CT scan - start nasal saline rinses with NeilMed Bottle available over the counter    Nasal Saline Irrigation instructions: If you choose to make your own salt water solution, You will need: Salt (kosher, canning, or pickling salt) Baking soda Nasal irrigation bottle (i.e. Andres Bangs Med Sinus Rinse) Measuring spoon ( teaspoon) Distilled / boiled water   Mix solution Mix 1 teaspoon of salt, 1/2 teaspoon of baking soda and 1 cup of water into irrigation bottle ** May use saline packet instead of homemade recipe for this step if you prefer If medicine was prescribed to be mixed with solution, place this into bottle Examples 2 inches of 2% mupirocin ointment Budesonide solution Position your head: Lean over sink (about 45 degrees) Rotate head (about 45 degrees) so that one nostril is above the other Irrigate Insert tip of irrigation bottle into upper nostril so it forms a comfortable seal Irrigate while breathing through your mouth May remove the straw from the bottle in order to irrigate the entire solution (important if medicine was added) Exhale through nose when finished and blow nose as necessary  Repeat on opposite side with other 1/2 of solution (120 mL) or remake solution if all 240 mL was used on first side Wash irrigation bottle regularly, replace every 3 months

## 2024-01-07 ENCOUNTER — Encounter: Payer: Self-pay | Admitting: Medical

## 2024-01-07 ENCOUNTER — Ambulatory Visit: Admitting: Medical

## 2024-01-07 VITALS — BP 120/82 | HR 68 | Ht 64.5 in | Wt 201.0 lb

## 2024-01-07 DIAGNOSIS — K5909 Other constipation: Secondary | ICD-10-CM

## 2024-01-07 DIAGNOSIS — R7301 Impaired fasting glucose: Secondary | ICD-10-CM

## 2024-01-07 DIAGNOSIS — J4540 Moderate persistent asthma, uncomplicated: Secondary | ICD-10-CM

## 2024-01-07 DIAGNOSIS — E782 Mixed hyperlipidemia: Secondary | ICD-10-CM | POA: Diagnosis not present

## 2024-01-07 DIAGNOSIS — I2510 Atherosclerotic heart disease of native coronary artery without angina pectoris: Secondary | ICD-10-CM

## 2024-01-07 DIAGNOSIS — Z0000 Encounter for general adult medical examination without abnormal findings: Secondary | ICD-10-CM | POA: Diagnosis not present

## 2024-01-07 DIAGNOSIS — E559 Vitamin D deficiency, unspecified: Secondary | ICD-10-CM

## 2024-01-07 DIAGNOSIS — R5383 Other fatigue: Secondary | ICD-10-CM

## 2024-01-07 DIAGNOSIS — N951 Menopausal and female climacteric states: Secondary | ICD-10-CM

## 2024-01-07 NOTE — Progress Notes (Signed)
 Subjective:   HPI  Kristy [name] is a 53 y.o. female who presents for Chief Complaint  Patient presents with   Annual Exam    CPE fasting labs, tired and fatigued, can hear heartbeat in her head sometimes,     Patient Care Team: Naja Apperson, Alm RAMAN, PA-C as PCP - General (Family Medicine) Copland, Bernarda NOVAK, PA-C as Referring Physician (Obstetrics and Gynecology) Aneita Gwendlyn DASEN, MD (Inactive) as Consulting Physician (Gastroenterology) Vernetta Lonni GRADE, MD as Consulting Physician (Orthopedic Surgery) Kate Lonni CROME, MD as Consulting Physician (Cardiology) Jeneal Danita Macintosh, MD as Consulting Physician (Allergy) Darlean Ozell NOVAK, MD as Consulting Physician (Pulmonary Disease) Roshini Fulwider, Alm RAMAN, PA-C (Family Medicine) Gastroenterology, Margarete Sees dentist Sees eye doctor [facility] rheumatology [facility]   Concerns: Here for routine physical.  Her main concern today is fatigue  She has constipation that is worse of late.  She has a history of chronic constipation.  She is on Zepbound weight loss medication.  This is prescribed through different office than here.  She is compliant with her rheumatoid medicines to rheumatology including methotrexate and Plaquenil and folic acid  She gets lab work regularly every 3 months through rheumatology  She is compliant with her medications for allergies  She has on Veozah  for menopausal symptoms  Hyperlipidemia-compliant with Repatha  injection  Reviewed their medical, surgical, family, social, medication, and allergy history and updated chart as appropriate.   Past Medical History:  Diagnosis Date   Allergy    BRCA negative 11/2012, 4/19   BRCA neg 2014; MyRisk update neg except POLE VUS 4/19   Dysrhythmia    a. 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 pylori infection 10/2010    H/O exercise stress test 2018   Increased risk of breast cancer 11/2012; 4/19   5/14 IBIS=25.6%; 4/19 IBIS=25.8%   Iron deficiency anemia, unspecified    history of, normal hemoglobin 06/2019   Obesity    Rheumatoid arthritis (HCC)    Seasonal allergies    ragweed, tree pollen, mild and dust per testing 08/2012   Tubular adenoma of colon 2022   Unspecified vitamin D  deficiency    Vitamin D  deficiency 2017    Family History  Problem Relation Age of Onset   Heart disease Mother 59       stent, CAD   Breast cancer Mother 54       with recurrence about 50   Cancer Father        esophageal cancer   Heart disease Father 54       died of MI   Alcohol abuse Father    Esophageal cancer Father    Hypertension Sister    Hypertension Brother    CAD Brother        MI at age 34   Hypertension Brother    Breast cancer Maternal Grandmother 37   Diabetes Neg Hx    Stroke Neg Hx    Colon cancer Neg Hx    Colon polyps Neg Hx    Stomach cancer Neg Hx    Rectal cancer Neg Hx      Current Outpatient Medications:    albuterol  (VENTOLIN  HFA) 108 (90 Base) MCG/ACT inhaler, Inhale 2 puffs into the lungs every 6 (six) hours as needed for wheezing or shortness of breath., Disp: 18 g, Rfl: 1   aspirin   EC 81 MG tablet, Take 1 tablet (81 mg total) by mouth daily. Swallow whole., Disp: 90 tablet, Rfl: 3   Azelaic Acid 15 % gel, Apply 1 Application topically 2 (two) times daily., Disp: , Rfl:    azelastine  (ASTELIN ) 0.1 % nasal spray, Place 2 sprays into both nostrils 2 (two) times daily. Use in each nostril as directed, Disp: 30 mL, Rfl: 12   cetirizine  (ZYRTEC ) 10 MG tablet, Take 1 tablet (10 mg total) by mouth daily. (Patient taking differently: Take 10 mg by mouth daily. prn), Disp: 90 tablet, Rfl: 3   Evolocumab  (REPATHA  SURECLICK) 140 MG/ML SOAJ, INJECT 140 MG INTO THE SKIN EVERY 14 (FOURTEEN) DAYS., Disp: 6 mL, Rfl: 3   Fezolinetant  (VEOZAH ) 45 MG TABS, Take 1 tablet (45 mg total) by mouth  daily. 2 SAMPLES GIVEN, Disp: 30 tablet, Rfl: 11   Fluocinolone Acetonide Scalp 0.01 % OIL, Apply 1 Application topically as directed. APPLY DIRECTLY TO SCALP DAILY WHEN ITCHING THE 1-2 TIMES WEEKLY FOR MAINTENANCE, Disp: , Rfl:    fluticasone  (FLONASE ) 50 MCG/ACT nasal spray, Place 2 sprays into both nostrils daily., Disp: 16 g, Rfl: 6   folic acid (FOLVITE) 1 MG tablet, Take 1 mg by mouth daily., Disp: , Rfl:    hydroxychloroquine (PLAQUENIL) 200 MG tablet, Take 400 mg by mouth daily., Disp: , Rfl:    ketoconazole (NIZORAL) 2 % shampoo, Apply 1 Application topically 2 (two) times a week., Disp: , Rfl:    methotrexate (RHEUMATREX) 2.5 MG tablet, Take 10 mg by mouth once a week., Disp: , Rfl:    tirzepatide (ZEPBOUND) 12.5 MG/0.5ML Pen, Inject 12.5 mg into the skin once a week., Disp: , Rfl:    triamcinolone ointment (KENALOG) 0.1 %, Apply 1 Application topically 2 (two) times daily., Disp: , Rfl:    famotidine  (PEPCID ) 20 MG tablet, TAKE ONE TABLET BY MOUTH AFTER SUPPER (Patient not taking: Reported on 01/07/2024), Disp: 90 tablet, Rfl: 3   montelukast  (SINGULAIR ) 10 MG tablet, TAKE 1 TABLET BY MOUTH EVERYDAY AT BEDTIME (Patient not taking: Reported on 01/07/2024), Disp: 90 tablet, Rfl: 3   sulconazole nitrate 1 % external solution, Apply 1 Application topically daily. (Patient not taking: Reported on 01/07/2024), Disp: , Rfl:   Allergies  Allergen Reactions   Iodinated Contrast Media Itching   Molds & Smuts Itching and Shortness Of Breath   Cat Dander     Other Reaction(s): Not available   Dog Epithelium (Canis Lupus Familiaris)     Other Reaction(s): Not available    Review of Systems  Constitutional:  Positive for malaise/fatigue. Negative for chills, fever and weight loss.  HENT:  Negative for congestion, ear pain, hearing loss, sore throat and tinnitus.   Eyes:  Negative for blurred vision, pain and redness.  Respiratory:  Negative for cough, hemoptysis and shortness of breath.    Cardiovascular:  Negative for chest pain, palpitations, orthopnea, claudication and leg swelling.  Gastrointestinal:  Negative for abdominal pain, blood in stool, constipation, diarrhea, nausea and vomiting.  Genitourinary:  Negative for dysuria, flank pain, frequency, hematuria and urgency.  Musculoskeletal:  Negative for falls, joint pain and myalgias.  Skin:  Negative for itching and rash.  Neurological:  Negative for dizziness, tingling, speech change, weakness and headaches.  Endo/Heme/Allergies:  Negative for polydipsia. Does not bruise/bleed easily.  Psychiatric/Behavioral:  Negative for depression and memory loss. The patient is not nervous/anxious and does not have insomnia.         01/07/2024  11:15 AM 12/03/2022   10:27 AM 11/27/2021   11:28 AM 06/12/2021   11:29 AM 10/24/2020    2:02 PM  Depression screen PHQ 2/9  Decreased Interest 0 0 0 0 0  Down, Depressed, Hopeless 0 0 0 0 0  PHQ - 2 Score 0 0 0 0 0       Objective:  BP 120/82   Pulse 68   Ht 5' 4.5 (1.638 m)   Wt 201 lb (91.2 kg)   LMP  (LMP Unknown)   BMI 33.97 kg/m   Wt Readings from Last 3 Encounters:  01/07/24 201 lb (91.2 kg)  12/25/23 199 lb (90.3 kg)  06/26/23 215 lb (97.5 kg)   BP Readings from Last 3 Encounters:  01/07/24 120/82  12/25/23 126/82  06/26/23 118/76    General appearance: alert, no distress, WD/WN, African American female Skin: unremarkable HEENT: normocephalic, conjunctiva/corneas normal, sclerae anicteric, PERRLA, EOMi, nares patent, no discharge or erythema, pharynx normal Oral cavity: MMM, tongue normal, teeth normal Neck: supple, no lymphadenopathy, no thyromegaly, no masses, normal ROM, no bruits Chest: non tender, normal shape and expansion Heart: RRR, normal S1, S2, no murmurs Lungs: CTA bilaterally, no wheezes, rhonchi, or rales Abdomen: +bs, soft, non tender, non distended, no masses, no hepatomegaly, no splenomegaly, no bruits Back: non tender, normal ROM, no  scoliosis Musculoskeletal: upper extremities non tender, no obvious deformity, normal ROM throughout, lower extremities non tender, no obvious deformity, normal ROM throughout Extremities: no edema, no cyanosis, no clubbing Pulses: 2+ symmetric, upper and lower extremities, normal cap refill Neurological: alert, oriented x 3, CN2-12 intact, strength normal upper extremities and lower extremities, sensation normal throughout, DTRs 2+ throughout, no cerebellar signs, gait normal Psychiatric: normal affect, behavior normal, pleasant  Breast/gyn/rectal - deferred to gynecology     Assessment and Plan :   Encounter Diagnoses  Name Primary?   Routine general medical examination at a health care facility Yes   Vitamin D  deficiency    Impaired fasting blood sugar    Mixed hyperlipidemia    Fatigue, unspecified type    Chronic constipation    Coronary artery disease involving native coronary artery of native heart without angina pectoris    Moderate persistent reactive airway disease without complication    Perimenopausal vasomotor symptoms       This visit was a preventative care visit, also known as wellness visit or routine physical.   Topics typically include healthy lifestyle, diet, exercise, preventative care, vaccinations, sick and well care, proper use of emergency dept and after hours care, as well as other concerns.     Recommendations: Continue to return yearly for your annual wellness and preventative care visits.  This gives us  a chance to discuss healthy lifestyle, exercise, vaccinations, review your chart record, and perform screenings where appropriate.  I recommend you see your eye doctor yearly for routine vision care.  I recommend you see your dentist yearly for routine dental care including hygiene visits twice yearly.  See your gynecologist yearly for routine gynecological care.    Vaccination recommendations were reviewed Immunization History  Administered  Date(s) Administered   PFIZER(Purple Top)SARS-COV-2 Vaccination 08/26/2019, 09/20/2019, 05/25/2020   PPD Test 04/02/2016   Td 11/27/2021   Recommended shingles and pneumococcal vaccine.  She declines but may return for these    Screening for cancer: Colon cancer screening: I reviewed your colonoscopy on file that is up to date from 2022.  Due repeat in 2027.  Breast cancer screening: You should  perform a self breast exam monthly.   We reviewed recommendations for regular mammograms and breast cancer screening.  Cervical cancer screening: We reviewed recommendations for pap smear screening.  Pap reviewed, up to date   Skin cancer screening: Check your skin regularly for new changes, growing lesions, or other lesions of concern Come in for evaluation if you have skin lesions of concern.  Lung cancer screening: If you have a greater than 20 pack year history of tobacco use, then you may qualify for lung cancer screening with a chest CT scan.   Please call your insurance company to inquire about coverage for this test.  We currently don't have screenings for other cancers besides breast, cervical, colon, and lung cancers.  If you have a strong family history of cancer or have other cancer screening concerns, please let me know.    Bone health: Get at least 150 minutes of aerobic exercise weekly Get weight bearing exercise at least once weekly Bone density test:  A bone density test is an imaging test that uses a type of X-ray to measure the amount of calcium  and other minerals in your bones. The test may be used to diagnose or screen you for a condition that causes weak or thin bones (osteoporosis), predict your risk for a broken bone (fracture), or determine how well your osteoporosis treatment is working. The bone density test is recommended for females 65 and older, or females or males <65 if certain risk factors such as thyroid  disease, long term use of steroids such as for asthma  or rheumatological issues, vitamin D  deficiency, estrogen deficiency, family history of osteoporosis, self or family history of fragility fracture in first degree relative.    Heart health: Get at least 150 minutes of aerobic exercise weekly Limit alcohol It is important to maintain a healthy blood pressure and healthy cholesterol numbers  Heart disease screening: Screening for heart disease includes screening for blood pressure, fasting lipids, glucose/diabetes screening, BMI height to weight ratio, reviewed of smoking status, physical activity, and diet.    Goals include blood pressure 120/80 or less, maintaining a healthy lipid/cholesterol profile, preventing diabetes or keeping diabetes numbers under good control, not smoking or using tobacco products, exercising most days per week or at least 150 minutes per week of exercise, and eating healthy variety of fruits and vegetables, healthy oils, and avoiding unhealthy food choices like fried food, fast food, high sugar and high cholesterol foods.    Continue routine follow up with cardiology   Medical care options: I recommend you continue to seek care here first for routine care.  We try really hard to have available appointments Monday through Friday daytime hours for sick visits, acute visits, and physicals.  Urgent care should be used for after hours and weekends for significant issues that cannot wait till the next day.  The emergency department should be used for significant potentially life-threatening emergencies.  The emergency department is expensive, can often have long wait times for less significant concerns, so try to utilize primary care, urgent care, or telemedicine when possible to avoid unnecessary trips to the emergency department.  Virtual visits and telemedicine have been introduced since the pandemic started in 2020, and can be convenient ways to receive medical care.  We offer virtual appointments as well to assist you in a  variety of options to seek medical care.    Separate significant issues discussed: Faitgue -discussed possible causes.  Consider sleep study.  Follow-up pending labs  Menopausal  symptoms-currently on Veozah   Reactive airway disease-no recent concerns  Coronary artery disease, hyperlipidemia- currently on repatha .  Has been on zetia  and statin prior, but had elevated LFTs/inflammation.    Impaired fasting glucose-updated lab today  Rheumatoid disease - follow with rheumatology as planned, continue current medication CAD - continue repatha   BMI 33, obesity-continue with other exercise and healthy diet, currently on Zepbound, experiencing some constipation  Constipation-drink at least 80 to 100 ounces of water daily, get fiber in the diet daily such as 25 g daily, can use MiraLAX as needed over-the-counter  Evynn was seen today for annual exam.  Diagnoses and all orders for this visit:  Routine general medical examination at a health care facility -     TSH + free T4 -     Hemoglobin A1c -     Vitamin B12 -     VITAMIN D  25 Hydroxy (Vit-D Deficiency, Fractures) -     Lipid panel  Vitamin D  deficiency -     VITAMIN D  25 Hydroxy (Vit-D Deficiency, Fractures)  Impaired fasting blood sugar -     Hemoglobin A1c  Mixed hyperlipidemia -     Lipid panel  Fatigue, unspecified type -     TSH + free T4 -     Vitamin B12 -     VITAMIN D  25 Hydroxy (Vit-D Deficiency, Fractures) -     Lipid panel  Chronic constipation  Coronary artery disease involving native coronary artery of native heart without angina pectoris  Moderate persistent reactive airway disease without complication  Perimenopausal vasomotor symptoms    Follow-up pending labs, yearly for physical

## 2024-01-08 ENCOUNTER — Ambulatory Visit: Payer: Self-pay | Admitting: Medical

## 2024-01-08 ENCOUNTER — Other Ambulatory Visit: Payer: Self-pay | Admitting: Medical

## 2024-01-08 LAB — TSH+FREE T4
Free T4: 1.23 ng/dL (ref 0.82–1.77)
TSH: 1.32 u[IU]/mL (ref 0.450–4.500)

## 2024-01-08 LAB — LIPID PANEL
Chol/HDL Ratio: 2.8 ratio (ref 0.0–4.4)
Cholesterol, Total: 216 mg/dL — ABNORMAL HIGH (ref 100–199)
HDL: 77 mg/dL (ref >39)
LDL Chol Calc (NIH): 132 mg/dL — ABNORMAL HIGH (ref 0–99)
Triglycerides: 42 mg/dL (ref 0–149)
VLDL Cholesterol Cal: 7 mg/dL (ref 5–40)

## 2024-01-08 LAB — HEMOGLOBIN A1C
Est. average glucose Bld gHb Est-mCnc: 105 mg/dL
Hgb A1c MFr Bld: 5.3 % (ref 4.8–5.6)

## 2024-01-08 LAB — VITAMIN D 25 HYDROXY (VIT D DEFICIENCY, FRACTURES): Vit D, 25-Hydroxy: 44.4 ng/mL (ref 30.0–100.0)

## 2024-01-08 LAB — VITAMIN B12: Vitamin B-12: 652 pg/mL (ref 232–1245)

## 2024-01-08 NOTE — Progress Notes (Signed)
 Results sent through MyChart

## 2024-01-15 ENCOUNTER — Other Ambulatory Visit: Payer: Self-pay | Admitting: Medical

## 2024-02-03 NOTE — Progress Notes (Unsigned)
 Cardiology Office Note   Date:  02/04/2024  ID:  Kristy [name], DOB 1971-05-19, MRN [id number] PCP: Bulah Alm GORMAN DEVONNA  [facility] Providers Cardiologist:  Jeffrie  History of Present Illness Kristy [name] is a 53 y.o. female who with a past medical history of coronary artery disease here for follow-up appointment.  She has not been seen since October 2022.  CT scan was done earlier that year which showed coronary artery disease with evidence of right coronary artery disease.  Occasional chest tightness at that time.  Brother had an MI at 74 years old.  Mother and dad also had myocardial infarction's.  History also includes GERD, dysrhythmia (bigeminal PVCs) , iron deficiency anemia.  She is here for routine follow-up visit.  Today, she presents with coronary artery disease here for palpitations and audible heartbeat.  She experiences palpitations and an audible heartbeat, described as hearing her heartbeat in her head, occurring most nights of the week. Palpitations occur upon waking, with heart rates recorded at 88 to 90 beats per minute while lying down. She has not identified a consistent trigger despite attempts to do so.  She has mild coronary artery disease diagnosed in October 2022, with occasional chest tightness. Her family history includes heart attacks in her brother at age 17 and both parents. She manages her condition with Repatha  and aspirin , although she missed a dose of Repatha  recently. Her cholesterol levels remain elevated despite weight loss and dietary improvements.  She has a history of occasional premature ventricular contractions (PVCs). She is currently on Humira for rheumatoid arthritis and has lost about 30 pounds over the past year through regular physical activity. She is experiencing menopause, which she suspects may contribute to her symptoms.  Reports no shortness of breath nor dyspnea on exertion. Reports no chest pain, pressure, or tightness. No  edema, orthopnea, PND.   Discussed the use of AI scribe software for clinical note transcription with the patient, who gave verbal consent to proceed.  ROS: Pertinent ROS in HPI  Studies Reviewed     CT coronary, FFR 05/21/2021 EXAM: FFRCT ANALYSIS   FINDINGS: FFRct analysis was performed on the original cardiac CT angiogram dataset. Diagrammatic representation of the FFRct analysis is provided in a separate PDF document in PACS. This dictation was created using the PDF document and an interactive 3D model of the results. 3D model is not available in the EMR/PACS. Normal FFR range is >0.80.   1. Left Main:No significant lesions   2. LAD: CTFFR 0.92 across lesion in proximal to mid LAD. CTFFR 0.73 across lesion in distal LAD. CTFFR 0.92 across lesion in D1.   3. LCX: CTFFR 0.84 across lesion in distal LCX   4.  RCA: CTFFR 0.88 across lesion in mid RCA   IMPRESSION: 1. CTFFR suggests obstructive lesion in distal LAD (0.73). On review of images, I do not see a stenosis in this region, and vessel is small at this point (<74mm). Otherwise CTFFR suggests no functionally significant stenosis     Electronically Signed   By: Lonni Nanas M.D.   On: 05/22/2021 10:31  Physical Exam VS:  BP 114/80   Pulse 85   Ht 5' 5 (1.651 m)   Wt 198 lb 3.2 oz (89.9 kg)   LMP  (LMP Unknown)   SpO2 99%   BMI 32.98 kg/m        Wt Readings from Last 3 Encounters:  02/04/24 198 lb 3.2 oz (89.9 kg)  01/07/24 201 lb (  91.2 kg)  12/25/23 199 lb (90.3 kg)    GEN: Well nourished, well developed in no acute distress NECK: No JVD; No carotid bruits CARDIAC: RRR, no murmurs, rubs, gallops RESPIRATORY:  Clear to auscultation without rales, wheezing or rhonchi  ABDOMEN: Soft, non-tender, non-distended EXTREMITIES:  No edema; No deformity   ASSESSMENT AND PLAN  Coronary artery disease Mild coronary artery disease with elevated LDL of 135, target <70 due to family history and known  disease. Blood pressure and heart rate controlled. Missed Repatha  dose may affect cholesterol. - Review repeat cholesterol labs and adjust medications if necessary. - Continue Repatha  and daily aspirin . - Encourage adherence to diet and exercise regimen.  Palpitations Palpitations possibly related to menopause or supplements. Discussed potential causes including dehydration, electrolyte imbalance, and supplements. Explained heart monitor utility for rhythm assessment. - Place 7-day zio heart monitor to evaluate heart rhythm. - Order home sleep study to assess for sleep apnea. - Advise on avoiding caffeinated supplements and ensuring hydration.  Family history of premature CAD -lipid panel done today -last LDL 132      Dispo: She can follow-up in 1 year with Dr. Jeffrie  Signed, Orren LOISE Fabry, PA-C

## 2024-02-04 ENCOUNTER — Ambulatory Visit: Payer: Self-pay | Attending: Physician Assistant | Admitting: Physician Assistant

## 2024-02-04 ENCOUNTER — Encounter: Payer: Self-pay | Admitting: Physician Assistant

## 2024-02-04 ENCOUNTER — Telehealth: Payer: Self-pay | Admitting: Radiology

## 2024-02-04 ENCOUNTER — Telehealth: Payer: Self-pay | Admitting: Physician Assistant

## 2024-02-04 ENCOUNTER — Ambulatory Visit

## 2024-02-04 VITALS — BP 114/80 | HR 85 | Ht 65.0 in | Wt 198.2 lb

## 2024-02-04 DIAGNOSIS — I2510 Atherosclerotic heart disease of native coronary artery without angina pectoris: Secondary | ICD-10-CM | POA: Diagnosis not present

## 2024-02-04 DIAGNOSIS — E782 Mixed hyperlipidemia: Secondary | ICD-10-CM | POA: Diagnosis not present

## 2024-02-04 DIAGNOSIS — R079 Chest pain, unspecified: Secondary | ICD-10-CM

## 2024-02-04 DIAGNOSIS — Z8249 Family history of ischemic heart disease and other diseases of the circulatory system: Secondary | ICD-10-CM | POA: Diagnosis not present

## 2024-02-04 DIAGNOSIS — Z79899 Other long term (current) drug therapy: Secondary | ICD-10-CM

## 2024-02-04 DIAGNOSIS — R002 Palpitations: Secondary | ICD-10-CM

## 2024-02-04 DIAGNOSIS — G4719 Other hypersomnia: Secondary | ICD-10-CM

## 2024-02-04 DIAGNOSIS — R7989 Other specified abnormal findings of blood chemistry: Secondary | ICD-10-CM

## 2024-02-04 NOTE — Telephone Encounter (Signed)
 Pt just recently had labs done by her PCP in late June, including lipids.  Pt is calling in this morning asking to have her lipids rechecked, and wants to come in now and get this done and see Orren Fabry PA-C as planned this afternoon.   Pt states she wants to have her lipids rechecked again, for she has lost 8 lbs since her previous lipids done by PCP on 6/25, and wants to compare those values. Pt is aware that insurance will more than likely not cover for her lipids to be rechecked this soon, but states she is ok paying the difference insurance doesn't cover.  She states she is currently fasting this morning.  Pt aware if I get the authorization from [employer] to repeat her lipids, the results will not be back in time for the visit this afternoon.  Pt is aware I will ask Orren Fabry PA-C if she is ok repeating her lipids, and follow-up with her accordingly thereafter.

## 2024-02-04 NOTE — Progress Notes (Unsigned)
 Applied a 7 day Zio XT monitor to patient in the office

## 2024-02-04 NOTE — Telephone Encounter (Signed)
 Spoke with Orren Fabry PA-C and she okayed to order for this pt to get a repeat lipid today.  Order placed and released in the system.  Pt aware to report to any [facility] to have this drawn.   Pt aware we will see her as planned this afternoon.  Pt verbalized understanding and agrees with this plan.

## 2024-02-04 NOTE — Telephone Encounter (Signed)
 Patient agreement reviewed and signed on 02/04/2024.  WatchPAT issued to patient on 02/04/2024 by Woodie LOISE Prudent. Patient aware to not open the WatchPAT box until contacted with the activation PIN. Patient profile initialized in CloudPAT on 02/04/2024 by Rockie RAMAN. Device serial number: [id number]  Please list Reason for Call as Advice Only and type WatchPAT issued to patient in the comment box.

## 2024-02-04 NOTE — Telephone Encounter (Signed)
 Pt called in asking if she can have labs drawn before her appt today. Please advise.

## 2024-02-04 NOTE — Patient Instructions (Signed)
 Medication Instructions:   Your physician recommends that you continue on your current medications as directed. Please refer to the Current Medication list given to you today.  *If you need a refill on your cardiac medications before your next appointment, please call your pharmacy*   Testing/Procedures:  Your physician has recommended that you have a ITAMAR sleep study. This test records several body functions during sleep, including: brain activity, eye movement, oxygen and carbon dioxide blood levels, heart rate and rhythm, breathing rate and rhythm, the flow of air through your mouth and nose, snoring, body muscle movements, and chest and belly movement. THIS DEVICE WILL BE PROVIDED TO YOU IN THE CLINIC TODAY-- PLEASE FOLLOW ALL INSTRUCTIONS AS OUTLINED AND PROVIDED TODAY   ZIO XT- Long Term Monitor Instructions  Your physician has requested you wear a ZIO patch monitor for 7 days. THIS WILL BE PLACED IN THE CLINIC TODAY This is a single patch monitor. [employer] supplies one patch monitor per enrollment. Additional stickers are not available. Please do not apply patch if you will be having a Nuclear Stress Test,  Echocardiogram, Cardiac CT, MRI, or Chest Xray during the period you would be wearing the  monitor. The patch cannot be worn during these tests. You cannot remove and re-apply the  ZIO XT patch monitor.  Your ZIO patch monitor will be mailed 3 day USPS to your address on file. It may take 3-5 days  to receive your monitor after you have been enrolled.  Once you have received your monitor, please review the enclosed instructions. Your monitor  has already been registered assigning a specific monitor serial # to you.  Billing and Patient Assistance Program Information  We have supplied [employer] with any of your insurance information on file for billing purposes. [employer] offers a sliding scale Patient Assistance Program for patients that do not have  insurance, or whose insurance  does not completely cover the cost of the ZIO monitor.  You must apply for the Patient Assistance Program to qualify for this discounted rate.  To apply, please call [employer] at [phone], select option 4, select option 2, ask to apply for  Patient Assistance Program. Meredeth will ask your household income, and how many people  are in your household. They will quote your out-of-pocket cost based on that information.  [employer] will also be able to set up a 51-month, interest-free payment plan if needed.  Applying the monitor   Shave hair from upper left chest.  Hold abrader disc by orange tab. Rub abrader in 40 strokes over the upper left chest as  indicated in your monitor instructions.  Clean area with 4 enclosed alcohol pads. Let dry.  Apply patch as indicated in monitor instructions. Patch will be placed under collarbone on left  side of chest with arrow pointing upward.  Rub patch adhesive wings for 2 minutes. Remove white label marked 1. Remove the white  label marked 2. Rub patch adhesive wings for 2 additional minutes.  While looking in a mirror, press and release button in center of patch. A small green light will  flash 3-4 times. This will be your only indicator that the monitor has been turned on.  Do not shower for the first 24 hours. You may shower after the first 24 hours.  Press the button if you feel a symptom. You will hear a small click. Record Date, Time and  Symptom in the Patient Logbook.  When you are ready to remove the patch, follow instructions  on the last 2 pages of Patient  Logbook. Stick patch monitor onto the last page of Patient Logbook.  Place Patient Logbook in the blue and white box. Use locking tab on box and tape box closed  securely. The blue and white box has prepaid postage on it. Please place it in the mailbox as  soon as possible. Your physician should have your test results approximately 7 days after the  monitor has been mailed back to  [facility].  Call [facility] Customer Care at [phone] if you have questions regarding  your ZIO XT patch monitor. Call them immediately if you see an orange light blinking on your  monitor.  If your monitor falls off in less than 4 days, contact our Monitor department at [phone].  If your monitor becomes loose or falls off after 4 days call [employer] at [phone] for  suggestions on securing your monitor    Follow-Up: At [facility], you and your health needs are our priority.  As part of our continuing mission to provide you with exceptional heart care, our providers are all part of one team.  This team includes your primary Cardiologist (physician) and Advanced Practice Providers or APPs (Physician Assistants and Nurse Practitioners) who all work together to provide you with the care you need, when you need it.  Your next appointment:   1 year(s)  Provider:   DR. JEFFRIE

## 2024-02-05 ENCOUNTER — Ambulatory Visit: Payer: Self-pay | Admitting: Physician Assistant

## 2024-02-05 DIAGNOSIS — E782 Mixed hyperlipidemia: Secondary | ICD-10-CM

## 2024-02-05 LAB — LIPID PANEL
Chol/HDL Ratio: 2.6 ratio (ref 0.0–4.4)
Cholesterol, Total: 182 mg/dL (ref 100–199)
HDL: 69 mg/dL (ref >39)
LDL Chol Calc (NIH): 103 mg/dL — ABNORMAL HIGH (ref 0–99)
Triglycerides: 49 mg/dL (ref 0–149)
VLDL Cholesterol Cal: 10 mg/dL (ref 5–40)

## 2024-02-06 ENCOUNTER — Telehealth: Payer: Self-pay

## 2024-02-06 NOTE — Telephone Encounter (Signed)
**Note De-Identified Mayan Dolney Obfuscation** Ordering provider: Orren Fabry, PA-c Associated diagnoses: Palpitations-R00.2 and Daytime Sleepiness- G47.19  WatchPAT PA obtained on 02/06/2024 by Bethsaida Siegenthaler, Avelina HERO, LPN. Authorization: Per the [facility] Provider Portal:   Patient notified of PIN (1234) on 02/06/2024 Krayton Wortley Notification Method: phone.  Phone note routed to covering staff for follow-up.

## 2024-02-09 ENCOUNTER — Encounter (INDEPENDENT_AMBULATORY_CARE_PROVIDER_SITE_OTHER): Admitting: Cardiology

## 2024-02-09 DIAGNOSIS — R0683 Snoring: Secondary | ICD-10-CM | POA: Diagnosis not present

## 2024-02-09 DIAGNOSIS — R002 Palpitations: Secondary | ICD-10-CM

## 2024-02-09 NOTE — Telephone Encounter (Signed)
 Will forward to Orren Fabry PA-C's new covering CMA for August, to follow-up with the pt and ITAMAR.

## 2024-02-10 ENCOUNTER — Ambulatory Visit: Attending: Physician Assistant

## 2024-02-10 DIAGNOSIS — G4719 Other hypersomnia: Secondary | ICD-10-CM

## 2024-02-10 NOTE — Progress Notes (Deleted)
   SLEEP STUDY REPORT Patient Information Study Date: 02/10/2024 Patient Name: Kristy [name] Patient ID: [id number] Birth Date: Mar 15, 1971 Age: 53 Gender: Female BMI: 33.1 (W=198 lb, H=5' 5'') Referring Physician: Orren Fabry, PA  TEST DESCRIPTION: Home sleep apnea testing was completed using the WatchPat, a Type 1 device, utilizing  peripheral arterial tonometry (PAT), chest movement, actigraphy, pulse oximetry, pulse rate, body position and snore.  AHI was calculated with apnea and hypopnea using valid sleep time as the denominator. RDI includes apneas,  hypopneas, and RERAs. The data acquired and the scoring of sleep and all associated events were performed in  accordance with the recommended standards and specifications as outlined in the AASM Manual for the Scoring of  Sleep and Associated Events 2.2.0 (2015).   FINDINGS: 1. No evidence of Obstructive Sleep Apnea with AHI 1.8/hr.  2. No Central Sleep Apnea. 3. Oxygen desaturations as low as 84%. 4. Mild snoring was present. O2 sats were < 88% for 0.3 minutes. 5. Total sleep time was 1 hrs and 42 min. 6. 26.3% of total sleep time was spent in REM sleep.  7. Normal sleep onset latency at 10 min.  8. Prolonged REM sleep onset latency at 125 min.  9. Total awakenings were 5.   DIAGNOSIS:  Nondiagnostic study due to lack of adequate sleep time and poor PAT tracing  RECOMMENDATIONS: Recommend repeat Itamar HST or in lab PSG  Signature: Wilbert Bihari, MD; [facility]; Diplomat, American Board of Sleep  Medicine Electronically Signed: 02/10/2024 9:04:33 AM

## 2024-02-10 NOTE — Procedures (Signed)
     SLEEP STUDY REPORT Patient Information Study Date: 02/10/2024 Patient Name: Kristy [name] Patient ID: [id number] Birth Date: Mar 15, 1971 Age: 53 Gender: Female BMI: 33.1 (W=198 lb, H=5' 5'') Referring Physician: Orren Fabry, PA  TEST DESCRIPTION: Home sleep apnea testing was completed using the WatchPat, a Type 1 device, utilizing  peripheral arterial tonometry (PAT), chest movement, actigraphy, pulse oximetry, pulse rate, body position and snore.  AHI was calculated with apnea and hypopnea using valid sleep time as the denominator. RDI includes apneas,  hypopneas, and RERAs. The data acquired and the scoring of sleep and all associated events were performed in  accordance with the recommended standards and specifications as outlined in the AASM Manual for the Scoring of  Sleep and Associated Events 2.2.0 (2015).   FINDINGS: 1. No evidence of Obstructive Sleep Apnea with AHI 1.8/hr.  2. No Central Sleep Apnea. 3. Oxygen desaturations as low as 84%. 4. Mild snoring was present. O2 sats were < 88% for 0.3 minutes. 5. Total sleep time was 1 hrs and 42 min. 6. 26.3% of total sleep time was spent in REM sleep.  7. Normal sleep onset latency at 10 min.  8. Prolonged REM sleep onset latency at 125 min.  9. Total awakenings were 5.   DIAGNOSIS:  Nondiagnostic study due to lack of adequate sleep time and poor PAT tracing  RECOMMENDATIONS: Recommend repeat Itamar HST or in lab PSG  Signature: Wilbert Bihari, MD; [facility]; Diplomat, American Board of Sleep  Medicine Electronically Signed: 02/10/2024 9:04:33 AM

## 2024-02-11 ENCOUNTER — Telehealth: Payer: Self-pay

## 2024-02-11 NOTE — Telephone Encounter (Signed)
 Notified patient of sleep study results and recommendations. All questions were answered and patient verbalized understanding. Patient will come in to office to pick up new Itamar Device.

## 2024-02-11 NOTE — Telephone Encounter (Signed)
-----   Message from Wilbert Bihari sent at 02/10/2024  9:06 AM EDT ----- Nondiagnostic sleep study due to inadqeuate sleep time and poor PAT tracings - needs to repeat HST.  Please find out if she needs a sleep aide

## 2024-02-19 NOTE — Telephone Encounter (Signed)
 Patient given serial #[id number]

## 2024-02-20 ENCOUNTER — Telehealth: Payer: Self-pay | Admitting: Physician Assistant

## 2024-02-20 DIAGNOSIS — I2510 Atherosclerotic heart disease of native coronary artery without angina pectoris: Secondary | ICD-10-CM

## 2024-02-20 DIAGNOSIS — I472 Ventricular tachycardia, unspecified: Secondary | ICD-10-CM

## 2024-02-20 DIAGNOSIS — R002 Palpitations: Secondary | ICD-10-CM

## 2024-02-20 MED ORDER — EZETIMIBE 10 MG PO TABS: 10.0000 mg | ORAL_TABLET | Freq: Every day | ORAL | 3 refills | Status: AC

## 2024-02-20 NOTE — Addendum Note (Signed)
 Addended by: Mckinzi Eriksen on: 02/20/2024 02:33 PM   Modules accepted: Orders

## 2024-02-20 NOTE — Telephone Encounter (Signed)
 Spoke with patient and send Zetia  10 mg to her preferred pharmacy and order lab work for 8 weeks per [employer], PA-C. Patient verbalized understanding and thanked me for the call.

## 2024-02-20 NOTE — Telephone Encounter (Signed)
 [employer] called about abnormal findings on monitor. Results are waiting to be reviewed. Will send to ordering provider.

## 2024-02-20 NOTE — Telephone Encounter (Signed)
 [employer] calling regarding results

## 2024-02-24 ENCOUNTER — Encounter (INDEPENDENT_AMBULATORY_CARE_PROVIDER_SITE_OTHER): Payer: Self-pay | Admitting: Cardiology

## 2024-02-24 DIAGNOSIS — R002 Palpitations: Secondary | ICD-10-CM | POA: Diagnosis not present

## 2024-02-24 DIAGNOSIS — G4733 Obstructive sleep apnea (adult) (pediatric): Secondary | ICD-10-CM

## 2024-02-24 MED ORDER — METOPROLOL SUCCINATE ER 50 MG PO TB24: 50.0000 mg | ORAL_TABLET | Freq: Every day | ORAL | 6 refills | Status: AC

## 2024-02-24 NOTE — Telephone Encounter (Signed)
 I spoke with patient and reviewed monitor results and recommendations from Orren Fabry, [place] with her.  Order for echo and EP referral placed.  Prescription sent to [facility] on [address] in [place]. ED precautions reviewed with patient. Patient reports she works out 3-4 times per week and is asking if she has an activity restrictions.  States she did have an episode of feeling lightheaded while dancing.  She was outside on a hot day at that time. Will check with Orren Fabry, PA to see if any activity restrictions recommended.

## 2024-02-24 NOTE — Telephone Encounter (Signed)
 Spoke with Dr. Jeffrie regarding this patient.  Had a 44-second episode of ventricular tachycardia that was noted on her monitor.  It looks like the VT went from narrow complex to wide-complex.  She also had several SVT episodes that were documented.  We need to order an echocardiogram, reason can be CAD.  We also need to start metoprolol  succinate 50 mg daily and have her keep track of heart rate and blood pressure for 2 weeks.  We also need to place a urgent referral for EP.  If true VT on monitor then she will need a cardiac catheterization.  Kristy LOISE Fabry, PA-C

## 2024-02-25 NOTE — Procedures (Signed)
     SLEEP STUDY REPORT Patient Information Study Date: 02/24/2024 Patient Name: Kristy [name] Patient ID: [id number] Birth Date: 1971-02-25 Age: 53 Gender: Female BMI: 33.1 (W=198 lb, H=5' 5'') Referring Physician: Orren Fabry, PA  TEST DESCRIPTION: Home sleep apnea testing was completed using the WatchPat, a Type 1 device, utilizing peripheral arterial tonometry (PAT), chest movement, actigraphy, pulse oximetry, pulse rate, body position and snore. AHI was calculated with apnea and hypopnea using valid sleep time as the denominator. RDI includes apneas, hypopneas, and RERAs. The data acquired and the scoring of sleep and all associated events were performed in accordance with the recommended standards and specifications as outlined in the AASM Manual for the Scoring of Sleep and Associated Events 2.2.0 (2015).  FINDINGS:  1. Mild Obstructive Sleep Apnea with AHI 6.2/hr overall and moderate during REM sleep with REM AHI 16/hr.  2. No Central Sleep Apnea with pAHIc /0hr.  3. Oxygen desaturations as low as 89%.  4. Mild to moderate snoring was present. O2 sats were < 88% for 0 min.  5. Total sleep time was 2 hrs and 35 min.  6. 29.4% of total sleep time was spent in REM sleep.  7. Normal sleep onset latency at 22 min.  8. Shortened REM sleep onset latency at 46 min.  9. Total awakenings were 4. 10. Arrhythmia detection: None  DIAGNOSIS: Mild Obstructive Sleep Apnea (G47.33) overall and moderate during REM sleep with REM AHI 16/hr.  RECOMMENDATIONS: 1. Clinical correlation of these findings is necessary. The decision to treat obstructive sleep apnea (OSA) is usually based on the presence of apnea symptoms or the presence of associated medical conditions such as Hypertension, Congestive Heart Failure, Atrial Fibrillation or Obesity. The most common symptoms of OSA are snoring, gasping for breath while sleeping, daytime sleepiness and fatigue. 2. Initiating apnea therapy is  recommended given the presence of symptoms and/or associated conditions. Recommend proceeding with one of the following:  a. Auto-CPAP therapy with a pressure range of 5-20cm H2O.  b. An oral appliance (OA) that can be obtained from certain dentists with expertise in sleep medicine. These are primarily of use in non-obese patients with mild and moderate disease.  c. An ENT consultation which may be useful to look for specific causes of obstruction and possible treatment options.  d. If patient is intolerant to PAP therapy, consider referral to ENT for evaluation for hypoglossal nerve stimulator. 3. Close follow-up is necessary to ensure success with CPAP or oral appliance therapy for maximum benefit . 4. A follow-up oximetry study on CPAP is recommended to assess the adequacy of therapy and determine the need for supplemental oxygen or the potential need for Bi-level therapy. An arterial blood gas to determine the adequacy of baseline ventilation and oxygenation should also be considered. 5. Healthy sleep recommendations include: adequate nightly sleep (normal 7-9 hrs/night), avoidance of caffeine after noon and alcohol near bedtime, and maintaining a sleep environment that is cool, dark and quiet. 6. Weight loss for overweight patients is recommended. Even modest amounts of weight loss can significantly improve the severity of sleep apnea. 7. Snoring recommendations include: weight loss where appropriate, side sleeping, and avoidance of alcohol before bed. 8. Operation of motor vehicle should be avoided when sleepy.  Signature: Wilbert Bihari, MD; [facility]; Diplomat, American Board of Sleep Medicine Electronically Signed: 02/25/2024 1:03:36 PM

## 2024-02-26 ENCOUNTER — Ambulatory Visit: Payer: Self-pay | Admitting: Physician Assistant

## 2024-03-01 ENCOUNTER — Telehealth: Payer: Self-pay | Admitting: Pharmacy Technician

## 2024-03-01 ENCOUNTER — Other Ambulatory Visit (HOSPITAL_COMMUNITY): Payer: Self-pay

## 2024-03-01 NOTE — Telephone Encounter (Signed)
 Pharmacy Patient Advocate Encounter   Received notification from Fax that prior authorization for repatha  is required/requested.   Insurance verification completed.   The patient is insured through [facility] .   Per test claim: PA required; PA submitted to above mentioned insurance via Latent Key/confirmation #/EOC [id number] Status is pending

## 2024-03-01 NOTE — Telephone Encounter (Signed)
 Prior auth cancelled by plan:

## 2024-03-02 ENCOUNTER — Ambulatory Visit: Attending: Cardiology | Admitting: Cardiology

## 2024-03-02 ENCOUNTER — Encounter: Payer: Self-pay | Admitting: Cardiology

## 2024-03-02 VITALS — BP 117/78 | HR 70 | Ht 65.0 in | Wt 194.0 lb

## 2024-03-02 DIAGNOSIS — I2510 Atherosclerotic heart disease of native coronary artery without angina pectoris: Secondary | ICD-10-CM | POA: Diagnosis not present

## 2024-03-02 DIAGNOSIS — I472 Ventricular tachycardia, unspecified: Secondary | ICD-10-CM | POA: Diagnosis not present

## 2024-03-02 NOTE — Progress Notes (Signed)
 Electrophysiology Office Note:   Date:  03/02/2024  ID:  Kristy [name], DOB 1970-10-13, MRN [id number]  Primary Cardiologist: None Electrophysiologist: Fonda Kitty, MD  {Click to update primary MD,subspecialty MD or APP then REFRESH:1}    History of Present Illness:   Kristy [name] is a 53 y.o. female with h/o *** seen today for ***  Discussed the use of AI scribe software for clinical note transcription with the patient, who gave verbal consent to proceed.  History of Present Illness Kristy [name] is a 53 year old female with coronary artery disease who presents with palpitations and near syncope.  She experiences palpitations, particularly during episodes of night sweats associated with menopause. She describes waking up with her heart racing and being able to hear her heartbeat in her head. A significant episode of feeling faint occurred while dancing, requiring her to stop and hold onto her husband until she felt stable again. Recovery was achieved with rest and hydration.  She is currently taking metoprolol  and has not experienced further episodes of palpitations since starting the medication. However, she notes a reduction in the frequency of hot flashes. She takes metoprolol  at night to minimize side effects such as fatigue. She mentions experiencing tingling in her fingers and a drooping sensation in one eye since starting metoprolol .  Her past medical history includes a coronary CTA in 2022, which showed a 50-70% blockage in a diagonal branch of the left anterior descending artery and a 20-50% blockage in the right coronary artery. She has not had any severe chest pain but describes occasional tightness that feels like gas, not associated with specific activities.  She has lost approximately 38 pounds recently and exercises regularly at home, including using a Stairmaster and following aerobics routines. She reports no significant symptoms during exercise.  No consistent chest  pain, only occasional tightness that does not correlate with activity.    Review of systems complete and found to be negative unless listed in HPI.   EP Information / Studies Reviewed:    {EKGtoday:28818}      ***  Risk Assessment/Calculations:   {Does this patient have ATRIAL FIBRILLATION?:[id number]}     STOP-Bang Score:  4  { Consider Dx Sleep Disordered Breathing or Sleep Apnea  ICD G47.33          :1}     Physical Exam:   VS:  BP 117/78   Pulse 70   Ht 5' 5 (1.651 m)   Wt 194 lb (88 kg)   LMP  (LMP Unknown)   SpO2 96%   BMI 32.28 kg/m    Wt Readings from Last 3 Encounters:  03/02/24 194 lb (88 kg)  02/04/24 198 lb 3.2 oz (89.9 kg)  01/07/24 201 lb (91.2 kg)     GEN: Well nourished, well developed in no acute distress NECK: No JVD CARDIAC: {EPRHYTHM:28826}, no murmurs, rubs, gallops RESPIRATORY:  Clear to auscultation without rales, wheezing or rhonchi  ABDOMEN: Soft, non-distended EXTREMITIES:  No edema; No deformity   ASSESSMENT AND PLAN:   Assessment and Plan Assessment & Plan Paroxysmal supraventricular tachycardia with presyncope Episodes of tachycardia and presyncope, particularly during physical activity. A 44-second episode was recorded. Metoprolol  may be effective as no recent episodes have been reported. Differential includes hormonal triggers due to menopause and potential coronary artery disease exacerbation. The goal is to prevent syncope, especially during activities like driving. - Continue metoprolol . - Order stress test to evaluate for ischemia and potential coronary artery disease contribution. - Consider heart  catheterization if stress test is abnormal or if symptoms persist despite normal stress test. - Advise to monitor for symptoms such as dizziness, lightheadedness, or presyncope and report if she occurs.  Coronary artery disease with prior non-critical stenoses Coronary artery disease with prior non-critical stenoses noted in a  coronary CTA from 2022. Potential progression of stenoses could contribute to current symptoms. Stress test or heart catheterization needed to assess current status of coronary arteries. Discussed heart catheterization for direct assessment and potential stenting if significant stenosis is found. Recovery from catheterization is typically quick, with same-day discharge and minimal restrictions. - Order stress test to evaluate for ischemia and assess current status of coronary arteries. - Consider heart catheterization if stress test is abnormal or if symptoms persist despite normal stress test. - Advise to maintain hydration and electrolyte balance to prevent exacerbation of symptoms.  Menopausal symptoms (hot flashes, night sweats) Menopausal symptoms including hot flashes and night sweats may contribute to episodes of tachycardia. Symptoms have decreased recently. Metoprolol  may help manage symptoms during menopause. Hormonal changes during menopause can increase the frequency of palpitations. - Continue metoprolol  to help manage symptoms during menopause. - Advise to monitor symptoms and report any changes.      Follow up with {EPMDS:28135::EP Team} {EPFOLLOW LE:71826}  Signed, Fonda Kitty, MD

## 2024-03-02 NOTE — Patient Instructions (Addendum)
 Medication Instructions:  Your physician recommends that you continue on your current medications as directed. Please refer to the Current Medication list given to you today.  *If you need a refill on your cardiac medications before your next appointment, please call your pharmacy*  Testing/Procedures: PET Stress CT - see instructions below  Follow-Up: At [facility], you and your health needs are our priority.  As part of our continuing mission to provide you with exceptional heart care, our providers are all part of one team.  This team includes your primary Cardiologist (physician) and Advanced Practice Providers or APPs (Physician Assistants and Nurse Practitioners) who all work together to provide you with the care you need, when you need it.  Your next appointment:   8-10 weeks   Provider:   Fonda Kitty, MD        Please report to Radiology at the [facility] Main Entrance 30 minutes early for your test.  [address] [place], [place] [address]                         OR   Please report to Radiology at [facility] Main Entrance, medical mall, 30 mins prior to your test.  [address]  [place], [place]  How to Prepare for Your Cardiac PET/CT Stress Test:  Nothing to eat or drink, except water, 3 hours prior to arrival time.  NO caffeine/decaffeinated products, or chocolate 12 hours prior to arrival. (Please note decaffeinated beverages (teas/coffees) still contain caffeine).  If you have caffeine within 12 hours prior, the test will need to be rescheduled.  Medication instructions: Do not take erectile dysfunction medications for 72 hours prior to test (sildenafil, tadalafil) Do not take nitrates (isosorbide mononitrate, Ranexa) the day before or day of test Do not take tamsulosin the day before or morning of test Hold theophylline containing medications for 12 hours. Hold Dipyridamole 48 hours prior to the  test.  Diabetic Preparation: If able to eat breakfast prior to 3 hour fasting, you may take all medications, including your insulin. Do not worry if you miss your breakfast dose of insulin - start at your next meal. If you do not eat prior to 3 hour fast-Hold all diabetes (oral and insulin) medications. Patients who wear a continuous glucose monitor MUST remove the device prior to scanning.  You may take your remaining medications with water.  NO perfume, cologne or lotion on chest or abdomen area. FEMALES - Please avoid wearing dresses to this appointment.  Total time is 1 to 2 hours; you may want to bring reading material for the waiting time.  IF YOU THINK YOU MAY BE PREGNANT, OR ARE NURSING PLEASE INFORM THE TECHNOLOGIST.  In preparation for your appointment, medication and supplies will be purchased.  Appointment availability is limited, so if you need to cancel or reschedule, please call the Radiology Department Scheduler at [phone] 24 hours in advance to avoid a cancellation fee of $100.00  What to Expect When you Arrive:  Once you arrive and check in for your appointment, you will be taken to a preparation room within the Radiology Department.  A technologist or Nurse will obtain your medical history, verify that you are correctly prepped for the exam, and explain the procedure.  Afterwards, an IV will be started in your arm and electrodes will be placed on your skin for EKG monitoring during the stress portion of the exam. Then  you will be escorted to the PET/CT scanner.  There, staff will get you positioned on the scanner and obtain a blood pressure and EKG.  During the exam, you will continue to be connected to the EKG and blood pressure machines.  A small, safe amount of a radioactive tracer will be injected in your IV to obtain a series of pictures of your heart along with an injection of a stress agent.    After your Exam:  It is recommended that you eat a meal and drink a  caffeinated beverage to counter act any effects of the stress agent.  Drink plenty of fluids for the remainder of the day and urinate frequently for the first couple of hours after the exam.  Your doctor will inform you of your test results within 7-10 business days.  For more information and frequently asked questions, please visit our website: [email or web address]  For questions about your test or how to prepare for your test, please call: Cardiac Imaging Nurse Navigators Office: [phone]

## 2024-03-03 ENCOUNTER — Telehealth: Payer: Self-pay | Admitting: *Deleted

## 2024-03-03 DIAGNOSIS — G4719 Other hypersomnia: Secondary | ICD-10-CM

## 2024-03-03 DIAGNOSIS — G4733 Obstructive sleep apnea (adult) (pediatric): Secondary | ICD-10-CM

## 2024-03-03 NOTE — Telephone Encounter (Signed)
-----   Message from Wilbert Bihari sent at 02/25/2024  1:05 PM EDT ----- Please let patient know that they have sleep apnea and recommend treating with CPAP.  Please order an auto CPAP from 4-15cm H2O with heated humidity and mask of choice.  Order overnight pulse ox on CPAP.  Followup with me in 6 weeks.

## 2024-03-06 ENCOUNTER — Other Ambulatory Visit: Payer: Self-pay | Admitting: Cardiology

## 2024-03-08 NOTE — Telephone Encounter (Signed)
 The patient has been notified of the result and verbalized understanding.  All questions (if any) were answered. Kristy [name], CMA 03/08/2024 2:11 PM    Upon patient request DME selection is [facility]. Patient understands he will be contacted by [facility] to set up his cpap. Patient understands to call if [facility] does not contact him with new setup in a timely manner. Patient understands they will be called once confirmation has been received from [facility]/ that they have received their new machine to schedule 10 week follow up appointment.   [facility] notified of new cpap order  Please add to airview Patient was grateful for the call and thanked me.

## 2024-03-10 ENCOUNTER — Ambulatory Visit (HOSPITAL_COMMUNITY)
Admission: RE | Admit: 2024-03-10 | Discharge: 2024-03-10 | Disposition: A | Discharge status: Home / Self Care | Source: Ambulatory Visit | Attending: Vascular Surgery | Admitting: Vascular Surgery

## 2024-03-10 DIAGNOSIS — I2510 Atherosclerotic heart disease of native coronary artery without angina pectoris: Secondary | ICD-10-CM | POA: Diagnosis present

## 2024-03-10 LAB — ECHOCARDIOGRAM COMPLETE
AR max vel: 3.49 cm2
AV Area VTI: 3.42 cm2
AV Area mean vel: 3.35 cm2
AV Mean grad: 3 mmHg
AV Peak grad: 6.1 mmHg
Ao pk vel: 1.23 m/s
Area-P 1/2: 4.15 cm2
MV M vel: 2.84 m/s
MV Peak grad: 32.3 mmHg
S' Lateral: 2.06 cm

## 2024-03-11 ENCOUNTER — Ambulatory Visit: Payer: Self-pay | Admitting: Physician Assistant

## 2024-03-23 ENCOUNTER — Encounter (HOSPITAL_COMMUNITY): Payer: Self-pay

## 2024-03-23 ENCOUNTER — Telehealth (HOSPITAL_COMMUNITY): Payer: Self-pay | Admitting: *Deleted

## 2024-03-23 NOTE — Telephone Encounter (Signed)
Reaching out to patient to offer assistance regarding upcoming cardiac imaging study; pt verbalizes understanding of appt date/time, parking situation and where to check in, pre-test NPO status and verified current allergies; name and call back number provided for further questions should they arise  Larey Brick RN Navigator Cardiac Imaging [facility] [phone] office [phone] cell  Patient aware to avoid caffeine 12 hours prior to her cardiac PET study.

## 2024-03-24 ENCOUNTER — Ambulatory Visit (HOSPITAL_COMMUNITY)
Admission: RE | Admit: 2024-03-24 | Discharge: 2024-03-24 | Disposition: A | Discharge status: Home / Self Care | Source: Ambulatory Visit | Attending: Cardiology | Admitting: Cardiology

## 2024-03-24 DIAGNOSIS — I2510 Atherosclerotic heart disease of native coronary artery without angina pectoris: Secondary | ICD-10-CM | POA: Insufficient documentation

## 2024-03-24 DIAGNOSIS — I472 Ventricular tachycardia, unspecified: Secondary | ICD-10-CM | POA: Insufficient documentation

## 2024-03-24 MED ORDER — REGADENOSON 0.4 MG/5ML IV SOLN
0.4000 mg | Freq: Once | INTRAVENOUS | Status: AC
Start: 2024-03-24 — End: 2024-03-24
  Administered 2024-03-24: 0.4 mg via INTRAVENOUS

## 2024-03-24 MED ORDER — REGADENOSON 0.4 MG/5ML IV SOLN
INTRAVENOUS | Status: AC
  Filled 2024-03-24: qty 5

## 2024-03-24 MED ORDER — RUBIDIUM RB82 GENERATOR (RUBYFILL)
22.9300 | PACK | Freq: Once | INTRAVENOUS | Status: AC
  Administered 2024-03-24: 22.93 via INTRAVENOUS

## 2024-03-24 MED ORDER — RUBIDIUM RB82 GENERATOR (RUBYFILL)
22.9100 | PACK | Freq: Once | INTRAVENOUS | Status: AC
Start: 2024-03-24 — End: 2024-03-24
  Administered 2024-03-24: 22.91 via INTRAVENOUS

## 2024-03-25 ENCOUNTER — Encounter: Payer: Self-pay | Admitting: Obstetrics and Gynecology

## 2024-03-25 DIAGNOSIS — Z9189 Other specified personal risk factors, not elsewhere classified: Secondary | ICD-10-CM

## 2024-03-25 DIAGNOSIS — Z803 Family history of malignant neoplasm of breast: Secondary | ICD-10-CM

## 2024-03-25 DIAGNOSIS — Z1239 Encounter for other screening for malignant neoplasm of breast: Secondary | ICD-10-CM

## 2024-03-25 LAB — NM PET CT CARDIAC PERFUSION MULTI W/ABSOLUTE BLOODFLOW
LV dias vol: 78 mL (ref 46–106)
MBFR: 2.71
Nuc Rest EF: 65 %
Nuc Stress EF: 70 %
Peak HR: 117 {beats}/min
Rest HR: 75 {beats}/min
Rest MBF: 1.11 ml/g/min
Rest Nuclear Isotope Dose: 22.9 mCi
ST Depression (mm): 0 mm
Stress MBF: 3.01 ml/g/min
Stress Nuclear Isotope Dose: 22.9 mCi
TID: 0.79

## 2024-04-11 ENCOUNTER — Ambulatory Visit: Payer: Self-pay | Admitting: Cardiology

## 2024-04-20 ENCOUNTER — Other Ambulatory Visit: Payer: Self-pay | Admitting: Obstetrics and Gynecology

## 2024-04-20 DIAGNOSIS — Z1231 Encounter for screening mammogram for malignant neoplasm of breast: Secondary | ICD-10-CM

## 2024-04-21 ENCOUNTER — Other Ambulatory Visit (HOSPITAL_COMMUNITY)

## 2024-04-29 ENCOUNTER — Ambulatory Visit (INDEPENDENT_AMBULATORY_CARE_PROVIDER_SITE_OTHER): Admitting: Otolaryngology

## 2024-05-05 ENCOUNTER — Other Ambulatory Visit: Payer: Self-pay | Admitting: *Deleted

## 2024-05-05 DIAGNOSIS — E782 Mixed hyperlipidemia: Secondary | ICD-10-CM

## 2024-05-06 ENCOUNTER — Ambulatory Visit: Attending: Cardiology | Admitting: Cardiology

## 2024-05-06 ENCOUNTER — Encounter: Payer: Self-pay | Admitting: Cardiology

## 2024-05-06 VITALS — BP 125/83 | HR 62 | Ht 65.0 in | Wt 191.0 lb

## 2024-05-06 DIAGNOSIS — I2510 Atherosclerotic heart disease of native coronary artery without angina pectoris: Secondary | ICD-10-CM | POA: Diagnosis not present

## 2024-05-06 DIAGNOSIS — R000 Tachycardia, unspecified: Secondary | ICD-10-CM | POA: Diagnosis not present

## 2024-05-06 NOTE — Patient Instructions (Signed)

## 2024-05-06 NOTE — Progress Notes (Signed)
 Electrophysiology Office Note:   Date:  05/06/2024  ID:  Kristy [name], DOB 1970/12/03, MRN [id number]  Primary Cardiologist: None Electrophysiologist: Fonda Kitty, MD      History of Present Illness:   Kristy [name] is a 53 y.o. female with h/o non-obstructive CAD, hyperlipidemia, palpitations who is being seen today for evaluation following abnormal Zio monitor.  Discussed the use of AI scribe software for clinical note transcription with the patient, who gave verbal consent to proceed.  History of Present Illness Kristy [name] is a 53 year old female with coronary artery disease who presents with palpitations and near syncope.  She experiences palpitations, particularly during episodes of night sweats associated with menopause. She describes waking up with her heart racing and being able to hear her heartbeat in her head. A significant episode of feeling faint occurred while dancing, requiring her to stop and hold onto her husband until she felt stable again. Recovery was achieved with rest and hydration.  She is currently taking metoprolol  and has not experienced further episodes of palpitations since starting the medication. However, she notes a reduction in the frequency of hot flashes. She takes metoprolol  at night to minimize side effects such as fatigue.   Her past medical history includes a coronary CTA in 2022, which showed a 50-70% blockage in a diagonal branch of the left anterior descending artery and a 20-50% blockage in the right coronary artery. She has not had any severe chest pain but describes occasional tightness that feels like gas, not associated with specific activities.  She has lost approximately 38 pounds recently and exercises regularly at home, including using a Stairmaster and following aerobics routines. She reports no significant symptoms during exercise.   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  She experiences brief, intermittent episodes of heart racing, which had ceased for a period but have recently recurred. She associates these episodes with her menstrual cycle and the onset of menopause, as she has begun experiencing menopausal symptoms.  She is currently taking metoprolol  at night, which she tolerates well without significant side effects. She has not experienced dizziness, lightheadedness, or episodes of near syncope since her last visit.  She has experienced chest pain twice since her last visit, without any specific activity or trigger. She is aware of having some calcification and a 50-69% blockage in an osteodiagonal branch, but previous PET scan results showed normal heart pumping function with no significant blockages affecting blood flow.  No dizziness, lightheadedness, or episodes of feeling like she might pass out. She reports having chest pain twice since her last visit.   Review of systems complete and found to be negative unless listed in HPI.   EP Information / Studies Reviewed:    EKG is not ordered today. EKG from 02/04/2024 reviewed which showed sinus rhythm, no obvious preexcitation.      Zio 01/2024:    Cardiac PET 03/25/24:    LV perfusion is normal. There is no evidence of ischemia. There is no evidence of infarction.   Rest left ventricular function is normal. Rest EF: 65%. Stress left ventricular function is normal. Stress EF: 70%. End diastolic cavity size is normal.   Myocardial blood flow was computed to be 1.47ml/g/min at rest and 3.01ml/g/min at stress. Global myocardial blood flow reserve was 2.71 and was normal.   Coronary calcium  was present on the attenuation correction CT images. Moderate coronary calcifications were  present. Coronary calcifications were present in the right coronary artery distribution(s).   The study is normal. The study is low risk.  Echo 03/10/24:   1. Left ventricular ejection  fraction, by estimation, is 60 to 65%. The  left ventricle has normal function. The left ventricle has no regional  wall motion abnormalities. Left ventricular diastolic parameters were  normal. The average left ventricular  global longitudinal strain is -22.0 %. The global longitudinal strain is  normal.   2. Right ventricular systolic function is normal. The right ventricular  size is normal. There is normal pulmonary artery systolic pressure.   3. The mitral valve is normal in structure. Trivial mitral valve  regurgitation. No evidence of mitral stenosis.   4. The aortic valve is tricuspid. There is mild calcification of the  aortic valve. Aortic valve regurgitation is not visualized. No aortic  stenosis is present.   5. The inferior vena cava is normal in size with greater than 50%  respiratory variability, suggesting right atrial pressure of 3 mmHg.    Physical Exam:   VS:  LMP  (LMP Unknown)    Wt Readings from Last 3 Encounters:  03/02/24 194 lb (88 kg)  02/04/24 198 lb 3.2 oz (89.9 kg)  01/07/24 201 lb (91.2 kg)     GEN: Well nourished, well developed in no acute distress NECK: No JVD CARDIAC: Normal rate, regular rhythm.  No murmurs rubs or gallops. RESPIRATORY:  Clear to auscultation without rales, wheezing or rhonchi  ABDOMEN: Soft, non-distended EXTREMITIES:  No edema; No deformity   ASSESSMENT AND PLAN:    # Wide-complex tachycardia: Will treat SVT for now, given history of coronary disease, although nonobstructive as of 3 years ago.  Cannot rule out SVT with aberrant conduction.  Appears to have correlated with an episode of near syncope while dancing but cannot say for sure. -Echocardiogram pending.  Scheduled for 03/10/2024. -Discussed left heart catheterization versus cardiac PET.  Patient would like to avoid invasive procedures if possible.  We will order a cardiac PET scan (we will request my colleague Dr. Toney Decent to help expedite) and if abnormal then pursue  cardiac catheterization.  She did have disease on coronary CTA from 2022, which may have progressed. -Continue metoprolol  50 mg daily.  She states no further palpitations since starting. -If no ischemia or significant coronary disease is detected, then we will medically manage for presumptive idiopathic VT.  If she continues to have recurrence despite beta-blockers then could pursue EP study before starting antiarrhythmics.  # Nonobstructive CAD: Does have occasional chest tightness which does not always correlate with activity.  She does have family history of premature CAD. -Given presence of VT, I think it is pertinent to rule out progression of her nonobstructive disease.  We will arrange for cardiac PET as above.   Follow up with Dr. Kennyth 2 months.    Total time of encounter: 65 minutes total time of encounter, including face-to-face patient care, coordination of care and counseling regarding high complexity medical decision making.   Signed, Fonda Kennyth, MD

## 2024-05-07 LAB — LIPID PANEL
Chol/HDL Ratio: 2.5 ratio (ref 0.0–4.4)
Cholesterol, Total: 194 mg/dL (ref 100–199)
HDL: 77 mg/dL (ref >39)
LDL Chol Calc (NIH): 108 mg/dL — ABNORMAL HIGH (ref 0–99)
Triglycerides: 45 mg/dL (ref 0–149)
VLDL Cholesterol Cal: 9 mg/dL (ref 5–40)

## 2024-05-17 ENCOUNTER — Ambulatory Visit: Payer: Self-pay | Admitting: Physician Assistant

## 2024-05-26 ENCOUNTER — Ambulatory Visit
Admission: RE | Admit: 2024-05-26 | Discharge: 2024-05-26 | Disposition: A | Discharge status: Home / Self Care | Source: Ambulatory Visit | Attending: Obstetrics and Gynecology | Admitting: Obstetrics and Gynecology

## 2024-05-26 ENCOUNTER — Encounter: Payer: Self-pay | Admitting: Physician Assistant

## 2024-05-26 DIAGNOSIS — Z803 Family history of malignant neoplasm of breast: Secondary | ICD-10-CM

## 2024-05-26 DIAGNOSIS — Z1239 Encounter for other screening for malignant neoplasm of breast: Secondary | ICD-10-CM

## 2024-05-26 DIAGNOSIS — Z9189 Other specified personal risk factors, not elsewhere classified: Secondary | ICD-10-CM

## 2024-05-26 MED ORDER — GADOPICLENOL 0.5 MMOL/ML IV SOLN
9.0000 mL | Freq: Once | INTRAVENOUS | Status: AC | PRN
  Administered 2024-05-26: 9 mL via INTRAVENOUS

## 2024-05-28 ENCOUNTER — Ambulatory Visit: Payer: Self-pay | Admitting: Obstetrics and Gynecology

## 2024-05-28 ENCOUNTER — Telehealth: Payer: Self-pay | Admitting: *Deleted

## 2024-05-28 NOTE — Telephone Encounter (Signed)
 Hi Dr Lucien I discontinued the use of the cpap I couldn get used to the machine I was losing sleep mouth dry felt under water I returned the machine to [facility] please make not for insurance purposes thank God it's mild case I may consider the mouth piece but the machine was unbearable for me I changed the mask and still couldn't do it.

## 2024-06-04 ENCOUNTER — Encounter: Payer: Self-pay | Admitting: Cardiology

## 2024-06-04 DIAGNOSIS — E66812 Obesity, class 2: Secondary | ICD-10-CM

## 2024-06-11 ENCOUNTER — Telehealth: Payer: Self-pay | Admitting: Pharmacy Technician

## 2024-06-11 ENCOUNTER — Other Ambulatory Visit (HOSPITAL_COMMUNITY): Payer: Self-pay

## 2024-06-11 NOTE — Telephone Encounter (Signed)
 Pharmacy Patient Advocate Encounter  Received notification from [employer] that Prior Authorization for wegovy has been APPROVED from 06/11/24 to 11/09/24. Ran test claim, Copay is $0.00. This test claim was processed through [facility]- copay amounts may vary at other pharmacies due to pharmacy/plan contracts, or as the patient moves through the different stages of their insurance plan.   PA #/Case ID/Reference #: [id number]

## 2024-06-11 NOTE — Telephone Encounter (Signed)
   Zepbound lf 06/03/24 86 ds -she said insurance stopped covering but will cover wegovy  Pharmacy Patient Advocate Encounter   Received notification from Pt Calls Messages that prior authorization for wegovy is required/requested.   Insurance verification completed.   The patient is insured through [facility].   Per test claim: PA required; PA submitted to above mentioned insurance via Latent Key/confirmation #/EOC [id number] Status is pending

## 2024-06-14 MED ORDER — WEGOVY 0.5 MG/0.5ML ~~LOC~~ SOAJ: 0.5000 mg | SUBCUTANEOUS | 0 refills | Status: DC

## 2024-06-16 ENCOUNTER — Ambulatory Visit
Admission: RE | Admit: 2024-06-16 | Discharge: 2024-06-16 | Disposition: A | Source: Ambulatory Visit | Attending: Obstetrics and Gynecology

## 2024-06-16 DIAGNOSIS — Z1231 Encounter for screening mammogram for malignant neoplasm of breast: Secondary | ICD-10-CM | POA: Diagnosis present

## 2024-06-24 ENCOUNTER — Ambulatory Visit: Payer: Self-pay | Admitting: Obstetrics and Gynecology

## 2024-07-20 NOTE — Progress Notes (Deleted)
 "    No chief complaint on file.   HPI:      [name] is a 54 y.o. [phone] who LMP was No LMP recorded (lmp unknown). Patient is postmenopausal., presents today for her annual examination. Her menses have been absent since novasure ablation/menopause confirmed on 2024 labs. No BTB, no dysmen. Has 2 small leio on u/s (7/23 now showing 3 cm, increased from 1.4 cm-2.2 cm).  Having significant vasomotor sx., no relief with estroven or amberen. Would prefer non-hormonal tx; interested in veozah . Hx of elevated LFTs last yr so didn't try veozah , but LFTs have been normal since stopping statin. Would like to try veozah  this yr.   Sex activity: sex active, contraception - vasectomy. Has pain in certain positions; pain feels different the past 6 months. Has some pain vaginally due to dryness, not relieved with lubricants. Question also pelvic etiology due to leio.  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 Arloa 10/21; 6 mo repeat pap 4/22 was neg cells (no HPV DNA done). Repeat due Q3 yrs per [facility]. Hx of STDs: HPV on pap  She has a hx of BV and AV in past.   Last mammogram: 06/16/24  Results were: normal--routine follow-up in 12 months  There is a FH of breast cancer in her mom and MGM. There is no FH of ovarian cancer. The patient does do self-breast exams. Pt was BRCA neg 5/14. Myrisk neg 2019. IBIS=25.8%. Neg breast MRI 11/25.   Tobacco use: The patient denies current or previous tobacco use. Alcohol ldz:[id number] No drug use. Exercise: mod active  Colonoscopy: 2021 per pt with polyps, repeat due after 5 yrs per pt. (Can't find records in Care Everywhere)   She does get adequate calcium  and Vitamin D  in her diet.   Labs with PCP  Pt with urinary frequency/nocturia with good flow, mostly at night. Drinks some caffeine during the day and drinking before bed. Still with  sx, didn't stop caffeine after 12 noon like discussed last yr. Hx of asymptomatic hematuria on UA in past with neg C&S. Saw urology in past with neg eval. Brother has same microscopic hematuria. No change in sx.   Past Medical History:  Diagnosis Date   Allergy    BRCA negative 11/2012, 4/19   BRCA neg 2014; MyRisk update neg except POLE VUS 4/19   Dysrhythmia    a. 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 pylori infection 10/2010   H/O exercise stress test 2018   Increased risk of breast cancer 11/2012; 4/19   5/14 IBIS=25.6%; 4/19 IBIS=25.8%   Iron deficiency anemia, unspecified    history of, normal hemoglobin 06/2019   Obesity    Rheumatoid arthritis (HCC)    Seasonal allergies    ragweed, tree pollen, mild and dust per testing 08/2012   Tubular adenoma of colon 2022   Unspecified vitamin D  deficiency    Vitamin D  deficiency 2017    Past Surgical History:  Procedure Laterality Date   BREAST BIOPSY Right 1980s   neg   CESAREAN SECTION     DILITATION & CURRETTAGE/HYSTROSCOPY WITH NOVASURE ABLATION N/A 09/14/2015   Procedure: DILATATION & CURETTAGE/HYSTEROSCOPY WITH NOVASURE ABLATION;  Surgeon: Kristy [name] Kristy [name] Arloa, MD;  Location:  [facility];  Service: Gynecology;  Laterality: N/A;   IUD REMOVAL N/A 09/14/2015   Procedure: INTRAUTERINE DEVICE (IUD) REMOVAL;  Surgeon: Kristy [name] Kristy [name] Lesches, MD;  Location: [facility];  Service: Gynecology;  Laterality: N/A;   LAPAROSCOPIC BILATERAL SALPINGECTOMY Bilateral 09/14/2015   Procedure: LAPAROSCOPIC BILATERAL SALPINGECTOMY;  Surgeon: Kristy [name] Kristy [name] Lesches, MD;  Location: [facility];  Service: Gynecology;  Laterality: Bilateral;   TUBAL LIGATION Bilateral 09/14/2015   Procedure: BILATERAL TUBAL LIGATION;  Surgeon: Kristy [name] Kristy [name] Lesches, MD;  Location: [facility];  Service: Gynecology;  Laterality: Bilateral;   VULVAR LESION REMOVAL N/A 09/14/2015   Procedure:  VULVAR LESION;  Surgeon: Kristy [name] Kristy [name] Lesches, MD;  Location: [facility];  Service: Gynecology;  Laterality: N/A;   WISDOM TOOTH EXTRACTION      Family History  Problem Relation Age of Onset   Heart disease Mother 55       stent, CAD   Breast cancer Mother 105       with recurrence about 38   Cancer Father        esophageal cancer   Heart disease Father 48       died of MI   Alcohol abuse Father    Esophageal cancer Father    Hypertension Sister    Hypertension Brother    CAD Brother        MI at age 24   Hypertension Brother    Breast cancer Maternal Grandmother 57   Diabetes Neg Hx    Stroke Neg Hx    Colon cancer Neg Hx    Colon polyps Neg Hx    Stomach cancer Neg Hx    Rectal cancer Neg Hx     Social History   Socioeconomic History   Marital status: Married    Spouse name: Not on file   Number of children: Not on file   Years of education: Not on file   Highest education level: Not on file  Occupational History   Not on file  Tobacco Use   Smoking status: Never   Smokeless tobacco: Never  Vaping Use   Vaping status: Never Used  Substance and Sexual Activity   Alcohol use: Not Currently    Comment: maybe one drink a year   Drug use: Never   Sexual activity: Yes    Birth control/protection: Surgical    Comment: reports ablation and partner had vasectomy.   Other Topics Concern   Not on file  Social History Narrative   ** Merged History Encounter ** Exercise - walking, 2-3 days per week.   Owns hair salon.   Married.  Has 1 son.   12/2023   Social Drivers of Health   Tobacco Use: Unknown (07/06/2024)   Received from [facility]   Patient History    Smoking Tobacco Use: Never    Smokeless Tobacco Use: Unknown    Passive Exposure: Not on file  Financial Resource Strain: Not on file  Food Insecurity: Not on file  Transportation Needs: Not on file  Physical Activity: Not on file  Stress: Not on file  Social Connections: Unknown (11/23/2021)   Received from  [facility]   Social Network    Social Network: Not on file  Intimate Partner Violence: Unknown (10/15/2021)   Received from [facility]   HITS    Physically Hurt: Not on file    Insult or Talk Down To: Not on file    Threaten Physical Harm: Not on file    Scream or Curse:  Not on file  Depression (PHQ2-9): Low Risk (01/07/2024)   Depression (PHQ2-9)    PHQ-2 Score: 0  Alcohol Screen: Not on file  Housing: Not on file  Utilities: Not on file  Health Literacy: Not on file    Current Outpatient Medications on File Prior to Visit  Medication Sig Dispense Refill   adalimumab (HUMIRA) 40 MG/0.4ML pen Inject 40 mg into the skin every 14 (fourteen) days.     aspirin  EC 81 MG tablet Take 1 tablet (81 mg total) by mouth daily. Swallow whole. 90 tablet 3   Azelaic Acid 15 % gel Apply 1 Application topically 2 (two) times daily.     azelastine  (ASTELIN ) 0.1 % nasal spray Place 2 sprays into both nostrils 2 (two) times daily. Use in each nostril as directed 30 mL 12   Evolocumab  (REPATHA  SURECLICK) 140 MG/ML SOAJ INJECT 140 MG INTO THE SKIN EVERY 14 (FOURTEEN) DAYS. 6 mL 3   ezetimibe  (ZETIA ) 10 MG tablet Take 1 tablet (10 mg total) by mouth daily. 90 tablet 3   Fezolinetant  (VEOZAH ) 45 MG TABS Take 1 tablet (45 mg total) by mouth daily. 2 SAMPLES GIVEN 30 tablet 11   Fluocinolone Acetonide Scalp 0.01 % OIL Apply 1 Application topically as directed. APPLY DIRECTLY TO SCALP DAILY WHEN ITCHING THE 1-2 TIMES WEEKLY FOR MAINTENANCE     fluticasone  (FLONASE ) 50 MCG/ACT nasal spray Place 2 sprays into both nostrils daily. 16 g 6   folic acid (FOLVITE) 1 MG tablet Take 1 mg by mouth daily.     hydroxychloroquine (PLAQUENIL) 200 MG tablet Take 400 mg by mouth daily.     ketoconazole (NIZORAL) 2 % shampoo Apply 1 Application topically 2 (two) times a week.     methotrexate (RHEUMATREX) 2.5 MG tablet Take 10 mg by mouth once a week.     metoprolol  succinate (TOPROL -XL) 50 MG 24 hr tablet Take 1 tablet  (50 mg total) by mouth daily. Take with or immediately following a meal. 30 tablet 6   semaglutide -weight management (WEGOVY ) 0.5 MG/0.5ML SOAJ SQ injection Inject 0.5 mg into the skin once a week. 2 mL 0   triamcinolone ointment (KENALOG) 0.1 % Apply 1 Application topically 2 (two) times daily.     No current facility-administered medications on file prior to visit.     ROS:  Review of Systems  Constitutional:  Negative for fatigue, fever and unexpected weight change.  Respiratory:  Negative for cough, shortness of breath and wheezing.   Cardiovascular:  Negative for chest pain, palpitations and leg swelling.  Gastrointestinal:  Negative for blood in stool, constipation, diarrhea, nausea and vomiting.  Endocrine: Negative for cold intolerance, heat intolerance and polyuria.  Genitourinary:  Positive for dyspareunia and frequency. Negative for dysuria, flank pain, genital sores, hematuria, menstrual problem, pelvic pain, urgency, vaginal bleeding, vaginal discharge and vaginal pain.  Musculoskeletal:  Negative for back pain, joint swelling and myalgias.  Skin:  Negative for rash.  Neurological:  Negative for dizziness, syncope, light-headedness, numbness and headaches.  Hematological:  Negative for adenopathy.  Psychiatric/Behavioral:  Negative for agitation, confusion, sleep disturbance and suicidal ideas. The patient is not nervous/anxious.   BREAST: no sx   Objective: LMP  (LMP Unknown)    Physical Exam Constitutional:      Appearance: She is well-developed.  Genitourinary:     Vulva normal.     Right Labia: No rash, tenderness or lesions.    Left Labia: No tenderness, lesions or rash.  No vaginal discharge, erythema or tenderness.      Right Adnexa: not tender and no mass present.    Left Adnexa: not tender and no mass present.    No cervical friability or polyp.     Uterus is not enlarged or tender.  Breasts:    Right: No mass, nipple discharge, skin change or  tenderness.     Left: No mass, nipple discharge, skin change or tenderness.  Neck:     Thyroid : No thyromegaly.  Cardiovascular:     Rate and Rhythm: Normal rate and regular rhythm.     Heart sounds: Normal heart sounds. No murmur heard. Pulmonary:     Effort: Pulmonary effort is normal.     Breath sounds: Normal breath sounds.  Abdominal:     Palpations: Abdomen is soft.     Tenderness: There is no abdominal tenderness. There is no guarding or rebound.  Musculoskeletal:        General: Normal range of motion.     Cervical back: Normal range of motion.  Lymphadenopathy:     Cervical: No cervical adenopathy.  Neurological:     General: No focal deficit present.     Mental Status: She is alert and oriented to person, place, and time.     Cranial Nerves: No cranial nerve deficit.  Skin:    General: Skin is warm and dry.  Psychiatric:        Mood and Affect: Mood normal.        Behavior: Behavior normal.        Thought Content: Thought content normal.        Judgment: Judgment normal.  Vitals reviewed.    No results found for this or any previous visit (from the past 24 hours).    Assessment/Plan: Encounter for annual routine gynecological examination  Encounter for screening mammogram for malignant neoplasm of breast; pt current on mammo  Encounter for breast cancer screening using non-mammogram modality - Plan: MR BREAST BILATERAL W WO CONTRAST INC CAD  Family history of breast cancer - Plan: MM 3D SCREEN BREAST BILATERAL; pt is MyRisk neg  Increased risk of breast cancer - Plan: MM 3D SCREEN BREAST BILATERAL; pt aware of monthly SBE, yearly CBE and mammos, as well as scr breast MRI. Breast MRI placed for 12/24. Will f/u with results. Cont Vit D supp.   Dyspareunia in female--question related to leio vs vaginal dryness. Try coconut oil for sx, follow up prn.   Perimenopausal vasomotor symptoms - Plan: Fezolinetant  (VEOZAH ) 45 MG TABS; start veozah . 2 samples/coupon  card/Rx eRxd. Pt aware to check CMP monthly for 3 months then at 9 and 12 months. Order placed. Hx of abn LFTs in past due to statin; normal past yr.   Menopausal state - Plan: FSH, Estradiol ; check labs to assess dx.   Blood tests for routine general physical examination - Plan: Comprehensive metabolic panel  Asymptomatic microscopic hematuria - Plan: POCT Urinalysis Dipstick; neg UA except blood (normal for pt). D/C caffeine after 12 noon. Can refer back to urology prn.   No orders of the defined types were placed in this encounter.        GYN counsel breast self exam, mammography screening, adequate intake of calcium  and vitamin D , diet and exercise     F/U  No follow-ups on file.   Kristy Bennion B. Bethene Hankinson, PA-C 07/20/2024 10:55 AM     "

## 2024-07-21 ENCOUNTER — Ambulatory Visit: Admitting: Obstetrics and Gynecology

## 2024-07-21 DIAGNOSIS — Z1231 Encounter for screening mammogram for malignant neoplasm of breast: Secondary | ICD-10-CM

## 2024-07-21 DIAGNOSIS — Z9189 Other specified personal risk factors, not elsewhere classified: Secondary | ICD-10-CM

## 2024-07-21 DIAGNOSIS — Z01419 Encounter for gynecological examination (general) (routine) without abnormal findings: Secondary | ICD-10-CM

## 2024-07-21 DIAGNOSIS — Z124 Encounter for screening for malignant neoplasm of cervix: Secondary | ICD-10-CM

## 2024-07-21 DIAGNOSIS — Z803 Family history of malignant neoplasm of breast: Secondary | ICD-10-CM

## 2024-07-21 DIAGNOSIS — N951 Menopausal and female climacteric states: Secondary | ICD-10-CM

## 2024-07-21 DIAGNOSIS — Z1211 Encounter for screening for malignant neoplasm of colon: Secondary | ICD-10-CM

## 2024-07-21 DIAGNOSIS — Z1151 Encounter for screening for human papillomavirus (HPV): Secondary | ICD-10-CM

## 2024-07-21 DIAGNOSIS — N941 Unspecified dyspareunia: Secondary | ICD-10-CM

## 2024-07-27 NOTE — Progress Notes (Unsigned)
 "    No chief complaint on file.   HPI:      [name] is a 53 y.o. [phone] who LMP was No LMP recorded (lmp unknown). Patient is postmenopausal., presents today for her annual examination. Her menses have been absent since novasure ablation/menopause confirmed on 2024 labs. No BTB, no dysmen. Has 2 small leio on u/s (7/23 now showing 3 cm, increased from 1.4 cm-2.2 cm).  Having significant vasomotor sx., no relief with estroven or amberen. Would prefer non-hormonal tx; interested in veozah . Hx of elevated LFTs last yr so didn't try veozah , but LFTs have been normal since stopping statin. Would like to try veozah  this yr.   Sex activity: sex active, contraception - vasectomy. Has pain in certain positions; pain feels different the past 6 months. Has some pain vaginally due to dryness, not relieved with lubricants. Question also pelvic etiology due to leio.  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 Arloa 10/21; 6 mo repeat pap 4/22 was neg cells (no HPV DNA done). Repeat due Q3 yrs per [facility]. Hx of STDs: HPV on pap  She has a hx of BV and AV in past.   Last mammogram: 06/16/24  Results were: normal--routine follow-up in 12 months  There is a FH of breast cancer in her mom and MGM. There is no FH of ovarian cancer. The patient does do self-breast exams. Pt was BRCA neg 5/14. Myrisk neg 2019. IBIS=25.8%. Neg breast MRI 11/25.   Tobacco use: The patient denies current or previous tobacco use. Alcohol ldz:wnwz No drug use. Exercise: mod active  Colonoscopy: 2021 per pt with polyps, repeat due after 5 yrs per pt. (Can't find records in Care Everywhere)   She does get adequate calcium  and Vitamin D  in her diet.   Labs with PCP  Pt with urinary frequency/nocturia with good flow, mostly at night. Drinks some caffeine during the day and drinking before bed. Still with  sx, didn't stop caffeine after 12 noon like discussed last yr. Hx of asymptomatic hematuria on UA in past with neg C&S. Saw urology in past with neg eval. Brother has same microscopic hematuria. No change in sx.   Past Medical History:  Diagnosis Date   Allergy    BRCA negative 11/2012, 4/19   BRCA neg 2014; MyRisk update neg except POLE VUS 4/19   Dysrhythmia    a. 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 pylori infection 10/2010   H/O exercise stress test 2018   Increased risk of breast cancer 11/2012; 4/19   5/14 IBIS=25.6%; 4/19 IBIS=25.8%   Iron deficiency anemia, unspecified    history of, normal hemoglobin 06/2019   Obesity    Rheumatoid arthritis (HCC)    Seasonal allergies    ragweed, tree pollen, mild and dust per testing 08/2012   Tubular adenoma of colon 2022   Unspecified vitamin D  deficiency    Vitamin D  deficiency 2017    Past Surgical History:  Procedure Laterality Date   BREAST BIOPSY Right 1980s   neg   CESAREAN SECTION     DILITATION & CURRETTAGE/HYSTROSCOPY WITH NOVASURE ABLATION N/A 09/14/2015   Procedure: DILATATION & CURETTAGE/HYSTEROSCOPY WITH NOVASURE ABLATION;  Surgeon: Lamar SHAUNNA Arloa, MD;  Location:  [facility];  Service: Gynecology;  Laterality: N/A;   IUD REMOVAL N/A 09/14/2015   Procedure: INTRAUTERINE DEVICE (IUD) REMOVAL;  Surgeon: Lamar SHAUNNA Lesches, MD;  Location: [facility];  Service: Gynecology;  Laterality: N/A;   LAPAROSCOPIC BILATERAL SALPINGECTOMY Bilateral 09/14/2015   Procedure: LAPAROSCOPIC BILATERAL SALPINGECTOMY;  Surgeon: Lamar SHAUNNA Lesches, MD;  Location: [facility];  Service: Gynecology;  Laterality: Bilateral;   TUBAL LIGATION Bilateral 09/14/2015   Procedure: BILATERAL TUBAL LIGATION;  Surgeon: Lamar SHAUNNA Lesches, MD;  Location: [facility];  Service: Gynecology;  Laterality: Bilateral;   VULVAR LESION REMOVAL N/A 09/14/2015   Procedure:  VULVAR LESION;  Surgeon: Lamar SHAUNNA Lesches, MD;  Location: [facility];  Service: Gynecology;  Laterality: N/A;   WISDOM TOOTH EXTRACTION      Family History  Problem Relation Age of Onset   Heart disease Mother 24       stent, CAD   Breast cancer Mother 15       with recurrence about 85   Cancer Father        esophageal cancer   Heart disease Father 66       died of MI   Alcohol abuse Father    Esophageal cancer Father    Hypertension Sister    Hypertension Brother    CAD Brother        MI at age 35   Hypertension Brother    Breast cancer Maternal Grandmother 32   Diabetes Neg Hx    Stroke Neg Hx    Colon cancer Neg Hx    Colon polyps Neg Hx    Stomach cancer Neg Hx    Rectal cancer Neg Hx     Social History   Socioeconomic History   Marital status: Married    Spouse name: Not on file   Number of children: Not on file   Years of education: Not on file   Highest education level: Not on file  Occupational History   Not on file  Tobacco Use   Smoking status: Never   Smokeless tobacco: Never  Vaping Use   Vaping status: Never Used  Substance and Sexual Activity   Alcohol use: Not Currently    Comment: maybe one drink a year   Drug use: Never   Sexual activity: Yes    Birth control/protection: Surgical    Comment: reports ablation and partner had vasectomy.   Other Topics Concern   Not on file  Social History Narrative   ** Merged History Encounter ** Exercise - walking, 2-3 days per week.   Owns hair salon.   Married.  Has 1 son.   12/2023   Social Drivers of Health   Tobacco Use: Unknown (07/06/2024)   Received from [facility]   Patient History    Smoking Tobacco Use: Never    Smokeless Tobacco Use: Unknown    Passive Exposure: Not on file  Financial Resource Strain: Not on file  Food Insecurity: Not on file  Transportation Needs: Not on file  Physical Activity: Not on file  Stress: Not on file  Social Connections: Unknown (11/23/2021)   [facility]  [facility]   Social Network    Social Network: Not on file  Intimate Partner Violence: Unknown (10/15/2021)   Received from [facility]   HITS    Physically Hurt: Not on file    Insult or Talk Down To: Not on file    Threaten Physical Harm: Not on file    Scream or Curse:  Not on file  Depression (PHQ2-9): Low Risk (01/07/2024)   Depression (PHQ2-9)    PHQ-2 Score: 0  Alcohol Screen: Not on file  Housing: Not on file  Utilities: Not on file  Health Literacy: Not on file    Current Outpatient Medications on File Prior to Visit  Medication Sig Dispense Refill   adalimumab (HUMIRA) 40 MG/0.4ML pen Inject 40 mg into the skin every 14 (fourteen) days.     aspirin  EC 81 MG tablet Take 1 tablet (81 mg total) by mouth daily. Swallow whole. 90 tablet 3   Azelaic Acid 15 % gel Apply 1 Application topically 2 (two) times daily.     azelastine  (ASTELIN ) 0.1 % nasal spray Place 2 sprays into both nostrils 2 (two) times daily. Use in each nostril as directed 30 mL 12   Evolocumab  (REPATHA  SURECLICK) 140 MG/ML SOAJ INJECT 140 MG INTO THE SKIN EVERY 14 (FOURTEEN) DAYS. 6 mL 3   ezetimibe  (ZETIA ) 10 MG tablet Take 1 tablet (10 mg total) by mouth daily. 90 tablet 3   Fezolinetant  (VEOZAH ) 45 MG TABS Take 1 tablet (45 mg total) by mouth daily. 2 SAMPLES GIVEN 30 tablet 11   Fluocinolone Acetonide Scalp 0.01 % OIL Apply 1 Application topically as directed. APPLY DIRECTLY TO SCALP DAILY WHEN ITCHING THE 1-2 TIMES WEEKLY FOR MAINTENANCE     fluticasone  (FLONASE ) 50 MCG/ACT nasal spray Place 2 sprays into both nostrils daily. 16 g 6   folic acid (FOLVITE) 1 MG tablet Take 1 mg by mouth daily.     hydroxychloroquine (PLAQUENIL) 200 MG tablet Take 400 mg by mouth daily.     ketoconazole (NIZORAL) 2 % shampoo Apply 1 Application topically 2 (two) times a week.     methotrexate (RHEUMATREX) 2.5 MG tablet Take 10 mg by mouth once a week.     metoprolol  succinate (TOPROL -XL) 50 MG 24 hr tablet Take 1 tablet  (50 mg total) by mouth daily. Take with or immediately following a meal. 30 tablet 6   semaglutide -weight management (WEGOVY ) 0.5 MG/0.5ML SOAJ SQ injection Inject 0.5 mg into the skin once a week. 2 mL 0   triamcinolone ointment (KENALOG) 0.1 % Apply 1 Application topically 2 (two) times daily.     No current facility-administered medications on file prior to visit.     ROS:  Review of Systems  Constitutional:  Negative for fatigue, fever and unexpected weight change.  Respiratory:  Negative for cough, shortness of breath and wheezing.   Cardiovascular:  Negative for chest pain, palpitations and leg swelling.  Gastrointestinal:  Negative for blood in stool, constipation, diarrhea, nausea and vomiting.  Endocrine: Negative for cold intolerance, heat intolerance and polyuria.  Genitourinary:  Positive for dyspareunia and frequency. Negative for dysuria, flank pain, genital sores, hematuria, menstrual problem, pelvic pain, urgency, vaginal bleeding, vaginal discharge and vaginal pain.  Musculoskeletal:  Negative for back pain, joint swelling and myalgias.  Skin:  Negative for rash.  Neurological:  Negative for dizziness, syncope, light-headedness, numbness and headaches.  Hematological:  Negative for adenopathy.  Psychiatric/Behavioral:  Negative for agitation, confusion, sleep disturbance and suicidal ideas. The patient is not nervous/anxious.   BREAST: no sx   Objective: LMP  (LMP Unknown)    Physical Exam Constitutional:      Appearance: She is well-developed.  Genitourinary:     Vulva normal.     Right Labia: No rash, tenderness or lesions.    Left Labia: No tenderness, lesions or rash.  No vaginal discharge, erythema or tenderness.      Right Adnexa: not tender and no mass present.    Left Adnexa: not tender and no mass present.    No cervical friability or polyp.     Uterus is not enlarged or tender.  Breasts:    Right: No mass, nipple discharge, skin change or  tenderness.     Left: No mass, nipple discharge, skin change or tenderness.  Neck:     Thyroid : No thyromegaly.  Cardiovascular:     Rate and Rhythm: Normal rate and regular rhythm.     Heart sounds: Normal heart sounds. No murmur heard. Pulmonary:     Effort: Pulmonary effort is normal.     Breath sounds: Normal breath sounds.  Abdominal:     Palpations: Abdomen is soft.     Tenderness: There is no abdominal tenderness. There is no guarding or rebound.  Musculoskeletal:        General: Normal range of motion.     Cervical back: Normal range of motion.  Lymphadenopathy:     Cervical: No cervical adenopathy.  Neurological:     General: No focal deficit present.     Mental Status: She is alert and oriented to person, place, and time.     Cranial Nerves: No cranial nerve deficit.  Skin:    General: Skin is warm and dry.  Psychiatric:        Mood and Affect: Mood normal.        Behavior: Behavior normal.        Thought Content: Thought content normal.        Judgment: Judgment normal.  Vitals reviewed.    No results found for this or any previous visit (from the past 24 hours).    Assessment/Plan: Encounter for annual routine gynecological examination  Encounter for screening mammogram for malignant neoplasm of breast; pt current on mammo  Encounter for breast cancer screening using non-mammogram modality - Plan: MR BREAST BILATERAL W WO CONTRAST INC CAD  Family history of breast cancer - Plan: MM 3D SCREEN BREAST BILATERAL; pt is MyRisk neg  Increased risk of breast cancer - Plan: MM 3D SCREEN BREAST BILATERAL; pt aware of monthly SBE, yearly CBE and mammos, as well as scr breast MRI. Breast MRI placed for 12/24. Will f/u with results. Cont Vit D supp.   Dyspareunia in female--question related to leio vs vaginal dryness. Try coconut oil for sx, follow up prn.   Perimenopausal vasomotor symptoms - Plan: Fezolinetant  (VEOZAH ) 45 MG TABS; start veozah . 2 samples/coupon  card/Rx eRxd. Pt aware to check CMP monthly for 3 months then at 9 and 12 months. Order placed. Hx of abn LFTs in past due to statin; normal past yr.   Menopausal state - Plan: FSH, Estradiol ; check labs to assess dx.   Blood tests for routine general physical examination - Plan: Comprehensive metabolic panel  Asymptomatic microscopic hematuria - Plan: POCT Urinalysis Dipstick; neg UA except blood (normal for pt). D/C caffeine after 12 noon. Can refer back to urology prn.   No orders of the defined types were placed in this encounter.        GYN counsel breast self exam, mammography screening, adequate intake of calcium  and vitamin D , diet and exercise     F/U  No follow-ups on file.   Porter Moes B. Kelso Bibby, PA-C 07/27/2024 11:11 AM     "

## 2024-07-29 ENCOUNTER — Encounter: Payer: Self-pay | Admitting: Obstetrics and Gynecology

## 2024-07-29 ENCOUNTER — Other Ambulatory Visit (HOSPITAL_COMMUNITY)
Admission: RE | Admit: 2024-07-29 | Discharge: 2024-07-29 | Disposition: A | Source: Ambulatory Visit | Attending: Obstetrics and Gynecology | Admitting: Obstetrics and Gynecology

## 2024-07-29 ENCOUNTER — Ambulatory Visit: Admitting: Obstetrics and Gynecology

## 2024-07-29 VITALS — BP 106/74 | HR 72 | Ht 65.0 in | Wt 196.0 lb

## 2024-07-29 DIAGNOSIS — N951 Menopausal and female climacteric states: Secondary | ICD-10-CM | POA: Diagnosis not present

## 2024-07-29 DIAGNOSIS — Z1151 Encounter for screening for human papillomavirus (HPV): Secondary | ICD-10-CM | POA: Insufficient documentation

## 2024-07-29 DIAGNOSIS — N941 Unspecified dyspareunia: Secondary | ICD-10-CM

## 2024-07-29 DIAGNOSIS — Z124 Encounter for screening for malignant neoplasm of cervix: Secondary | ICD-10-CM | POA: Diagnosis present

## 2024-07-29 DIAGNOSIS — Z1211 Encounter for screening for malignant neoplasm of colon: Secondary | ICD-10-CM

## 2024-07-29 DIAGNOSIS — Z87410 Personal history of cervical dysplasia: Secondary | ICD-10-CM | POA: Insufficient documentation

## 2024-07-29 DIAGNOSIS — Z9189 Other specified personal risk factors, not elsewhere classified: Secondary | ICD-10-CM

## 2024-07-29 DIAGNOSIS — R3121 Asymptomatic microscopic hematuria: Secondary | ICD-10-CM | POA: Diagnosis not present

## 2024-07-29 DIAGNOSIS — Z01419 Encounter for gynecological examination (general) (routine) without abnormal findings: Secondary | ICD-10-CM

## 2024-07-29 DIAGNOSIS — Z01411 Encounter for gynecological examination (general) (routine) with abnormal findings: Secondary | ICD-10-CM | POA: Diagnosis not present

## 2024-07-29 DIAGNOSIS — Z803 Family history of malignant neoplasm of breast: Secondary | ICD-10-CM | POA: Diagnosis not present

## 2024-07-29 MED ORDER — ESTRADIOL 0.01 % VA CREA: TOPICAL_CREAM | VAGINAL | 1 refills | Status: AC

## 2024-07-29 NOTE — Patient Instructions (Signed)
 I value your feedback and you entrusting [place] with your care. If you get a [place] patient survey, I would appreciate you taking the time to let us know about your experience today. Thank you! ? ? ?

## 2024-08-03 LAB — CYTOLOGY - PAP
Adequacy: ABSENT
Comment: NEGATIVE
Diagnosis: NEGATIVE
High risk HPV: NEGATIVE

## 2024-08-05 ENCOUNTER — Other Ambulatory Visit: Payer: Self-pay | Admitting: Physician Assistant

## 2024-08-05 DIAGNOSIS — E66812 Obesity, class 2: Secondary | ICD-10-CM

## 2024-08-06 ENCOUNTER — Encounter: Payer: Self-pay | Admitting: Physician Assistant

## 2024-08-06 ENCOUNTER — Encounter: Payer: Self-pay | Admitting: Cardiology

## 2024-08-06 DIAGNOSIS — Z6837 Body mass index (BMI) 37.0-37.9, adult: Secondary | ICD-10-CM

## 2024-08-06 MED ORDER — WEGOVY 0.5 MG/0.5ML ~~LOC~~ SOAJ: 0.5000 mg | SUBCUTANEOUS | 0 refills | Status: AC

## 2024-08-06 NOTE — Telephone Encounter (Signed)
 Sent in another encounter

## 2024-08-13 ENCOUNTER — Other Ambulatory Visit (HOSPITAL_COMMUNITY): Payer: Self-pay

## 2024-08-13 ENCOUNTER — Telehealth: Payer: Self-pay | Admitting: Pharmacy Technician

## 2024-08-13 NOTE — Telephone Encounter (Signed)
" ° ° °  Pharmacy Patient Advocate Encounter   Received notification from Patient Advice Request messages that prior authorization for wegovy  is required/requested.   Insurance verification completed.   The patient is insured through [employer].   Per test claim: PA required; PA started via CoverMyMeds. KEY [id number] . Please see clinical question(s) below that I am not finding the answer to in their chart and advise.      Sent chris a message before sending for weight loss solely only "

## 2024-08-18 NOTE — Telephone Encounter (Signed)
 Pharmacy Patient Advocate Encounter   Received notification from Patient Advice Request messages that prior authorization for zepbound is required/requested.   Insurance verification completed.   The patient is insured through [employer].   Per test claim: PA required; PA submitted to above mentioned insurance via Latent Key/confirmation #/EOC [id number] Status is pending

## 2024-08-18 NOTE — Telephone Encounter (Signed)
 Pharmacy Patient Advocate Encounter  Received notification from [employer] that Prior Authorization for WEGOVY  has been DENIED.  See denial reason below. No denial letter attached in CMM. Will attach denial letter to Media tab once received.   PA #/Case ID/Reference #: Wegovy  not covered benefit, excluded from plan in accordance with plan terms and conditions.

## 2024-08-19 NOTE — Telephone Encounter (Signed)
 Called stated patient stated she is taking medication for cardiovascular risk, not just weight lost as submitted on the prior auth. That stated to resubmit prior auth, that last year when prior auth was sent it did say that.

## 2024-08-19 NOTE — Telephone Encounter (Signed)
 Spoke with the patient. She was previously on Zepbound 12.5?mg but did not notice meaningful weight changes and requested to switch to Wegovy . I informed her that she does not meet the coverage criteria for Wegovy , and because her OSA is mild rather than moderate to severe, prior authorization approval for Zepbound is also uncertain. We reviewed cash-pay prices for both medications. We will proceed with assessing coverage for Zepbound 12.5?mg to continue her current therapy. Last dose she took was 2 weeks ago still has 4 pens of 12.5 mg Zepbound  will restart.

## 2024-08-19 NOTE — Telephone Encounter (Signed)
 Pharmacy Patient Advocate Encounter  Received notification from [employer] that Prior Authorization for zepbound has been DENIED.  See denial reason below. No denial letter attached in CMM. Will attach denial letter to Media tab once received.   PA #/Case ID/Reference #: Zepbound is not a covered benefit and is excluded from coverage under the plan's terms and conditions.

## 2024-08-19 NOTE — Telephone Encounter (Signed)
" ° ° °  I sent chris a message in the other encounter. Insurance is requiring proof of heart attack/stroke or pad for this to be ran as for cardiovascular risk.  "

## 2025-01-19 ENCOUNTER — Encounter: Payer: Self-pay | Admitting: Medical
